# Patient Record
Sex: Female | Born: 1935 | Race: White | Hispanic: No | Marital: Married | Smoking: Former smoker
Health system: Southern US, Community
[De-identification: ages and names within clinical notes are randomized; demographics above are authoritative.]

## PROBLEM LIST (undated history)

## (undated) DIAGNOSIS — R112 Nausea with vomiting, unspecified: Secondary | ICD-10-CM

## (undated) DIAGNOSIS — E785 Hyperlipidemia, unspecified: Secondary | ICD-10-CM

## (undated) DIAGNOSIS — E669 Obesity, unspecified: Secondary | ICD-10-CM

## (undated) DIAGNOSIS — T8859XA Other complications of anesthesia, initial encounter: Secondary | ICD-10-CM

## (undated) DIAGNOSIS — G4730 Sleep apnea, unspecified: Secondary | ICD-10-CM

## (undated) DIAGNOSIS — Z9889 Other specified postprocedural states: Secondary | ICD-10-CM

## (undated) DIAGNOSIS — T4145XA Adverse effect of unspecified anesthetic, initial encounter: Secondary | ICD-10-CM

## (undated) DIAGNOSIS — H409 Unspecified glaucoma: Secondary | ICD-10-CM

## (undated) DIAGNOSIS — F419 Anxiety disorder, unspecified: Secondary | ICD-10-CM

## (undated) DIAGNOSIS — I4821 Permanent atrial fibrillation: Secondary | ICD-10-CM

## (undated) DIAGNOSIS — R911 Solitary pulmonary nodule: Secondary | ICD-10-CM

## (undated) DIAGNOSIS — C50919 Malignant neoplasm of unspecified site of unspecified female breast: Secondary | ICD-10-CM

## (undated) DIAGNOSIS — K219 Gastro-esophageal reflux disease without esophagitis: Secondary | ICD-10-CM

## (undated) DIAGNOSIS — I5042 Chronic combined systolic (congestive) and diastolic (congestive) heart failure: Secondary | ICD-10-CM

## (undated) DIAGNOSIS — Z87442 Personal history of urinary calculi: Secondary | ICD-10-CM

## (undated) DIAGNOSIS — R441 Visual hallucinations: Secondary | ICD-10-CM

## (undated) DIAGNOSIS — H3530 Unspecified macular degeneration: Secondary | ICD-10-CM

## (undated) DIAGNOSIS — H269 Unspecified cataract: Secondary | ICD-10-CM

## (undated) DIAGNOSIS — I10 Essential (primary) hypertension: Secondary | ICD-10-CM

## (undated) HISTORY — DX: Chronic combined systolic (congestive) and diastolic (congestive) heart failure: I50.42

## (undated) HISTORY — PX: CATARACT EXTRACTION, BILATERAL: SHX1313

## (undated) HISTORY — DX: Unspecified macular degeneration: H35.30

## (undated) HISTORY — DX: Malignant neoplasm of unspecified site of unspecified female breast: C50.919

## (undated) HISTORY — DX: Essential (primary) hypertension: I10

## (undated) HISTORY — PX: MASTECTOMY: SHX3

## (undated) HISTORY — DX: Hyperlipidemia, unspecified: E78.5

## (undated) HISTORY — PX: TOTAL KNEE ARTHROPLASTY: SHX125

## (undated) HISTORY — DX: Unspecified glaucoma: H40.9

## (undated) HISTORY — DX: Unspecified cataract: H26.9

## (undated) HISTORY — DX: Sleep apnea, unspecified: G47.30

## (undated) HISTORY — DX: Obesity, unspecified: E66.9

## (undated) HISTORY — DX: Visual hallucinations: R44.1

---

## 1998-08-14 ENCOUNTER — Encounter: Payer: Self-pay | Admitting: *Deleted

## 1998-08-16 ENCOUNTER — Inpatient Hospital Stay (HOSPITAL_COMMUNITY): Admission: RE | Admit: 1998-08-16 | Discharge: 1998-08-21 | Payer: Self-pay | Admitting: *Deleted

## 2000-01-20 ENCOUNTER — Encounter: Payer: Self-pay | Admitting: General Surgery

## 2000-01-20 ENCOUNTER — Encounter: Admission: RE | Admit: 2000-01-20 | Discharge: 2000-01-20 | Payer: Self-pay | Admitting: General Surgery

## 2000-01-20 ENCOUNTER — Other Ambulatory Visit: Admission: RE | Admit: 2000-01-20 | Discharge: 2000-01-20 | Payer: Self-pay | Admitting: General Surgery

## 2000-01-20 ENCOUNTER — Encounter (INDEPENDENT_AMBULATORY_CARE_PROVIDER_SITE_OTHER): Payer: Self-pay | Admitting: Specialist

## 2000-01-26 ENCOUNTER — Encounter: Payer: Self-pay | Admitting: General Surgery

## 2000-01-27 ENCOUNTER — Encounter (INDEPENDENT_AMBULATORY_CARE_PROVIDER_SITE_OTHER): Payer: Self-pay | Admitting: *Deleted

## 2000-01-27 ENCOUNTER — Encounter: Payer: Self-pay | Admitting: General Surgery

## 2000-01-28 ENCOUNTER — Inpatient Hospital Stay (HOSPITAL_COMMUNITY): Admission: AD | Admit: 2000-01-28 | Discharge: 2000-01-29 | Payer: Self-pay | Admitting: General Surgery

## 2000-02-18 ENCOUNTER — Encounter (HOSPITAL_COMMUNITY): Payer: Self-pay | Admitting: Oncology

## 2000-02-18 ENCOUNTER — Ambulatory Visit (HOSPITAL_COMMUNITY): Admission: RE | Admit: 2000-02-18 | Discharge: 2000-02-18 | Payer: Self-pay | Admitting: Oncology

## 2000-02-19 ENCOUNTER — Encounter: Admission: RE | Admit: 2000-02-19 | Discharge: 2000-02-19 | Payer: Self-pay | Admitting: General Surgery

## 2000-02-19 ENCOUNTER — Encounter: Payer: Self-pay | Admitting: General Surgery

## 2000-02-20 ENCOUNTER — Encounter: Payer: Self-pay | Admitting: General Surgery

## 2000-02-20 ENCOUNTER — Ambulatory Visit (HOSPITAL_BASED_OUTPATIENT_CLINIC_OR_DEPARTMENT_OTHER): Admission: RE | Admit: 2000-02-20 | Discharge: 2000-02-20 | Payer: Self-pay | Admitting: General Surgery

## 2000-03-01 ENCOUNTER — Encounter (HOSPITAL_COMMUNITY): Payer: Self-pay | Admitting: Oncology

## 2000-03-01 ENCOUNTER — Ambulatory Visit (HOSPITAL_COMMUNITY): Admission: RE | Admit: 2000-03-01 | Discharge: 2000-03-01 | Payer: Self-pay | Admitting: Oncology

## 2000-08-12 ENCOUNTER — Encounter: Payer: Self-pay | Admitting: Internal Medicine

## 2000-08-12 ENCOUNTER — Ambulatory Visit (HOSPITAL_COMMUNITY): Admission: RE | Admit: 2000-08-12 | Discharge: 2000-08-12 | Payer: Self-pay | Admitting: Internal Medicine

## 2000-08-25 ENCOUNTER — Encounter (HOSPITAL_COMMUNITY): Payer: Self-pay | Admitting: Oncology

## 2000-08-25 ENCOUNTER — Ambulatory Visit (HOSPITAL_COMMUNITY): Admission: RE | Admit: 2000-08-25 | Discharge: 2000-08-25 | Payer: Self-pay | Admitting: Oncology

## 2000-10-26 ENCOUNTER — Encounter: Admission: RE | Admit: 2000-10-26 | Discharge: 2001-01-24 | Payer: Self-pay | Admitting: *Deleted

## 2001-01-28 ENCOUNTER — Encounter: Payer: Self-pay | Admitting: General Surgery

## 2001-01-28 ENCOUNTER — Encounter: Admission: RE | Admit: 2001-01-28 | Discharge: 2001-01-28 | Payer: Self-pay

## 2001-03-10 ENCOUNTER — Encounter (HOSPITAL_COMMUNITY): Payer: Self-pay | Admitting: Oncology

## 2001-03-10 ENCOUNTER — Encounter: Admission: RE | Admit: 2001-03-10 | Discharge: 2001-03-10 | Payer: Self-pay | Admitting: Oncology

## 2001-07-22 ENCOUNTER — Encounter (HOSPITAL_COMMUNITY): Payer: Self-pay | Admitting: Oncology

## 2001-07-22 ENCOUNTER — Ambulatory Visit (HOSPITAL_COMMUNITY): Admission: RE | Admit: 2001-07-22 | Discharge: 2001-07-22 | Payer: Self-pay | Admitting: Oncology

## 2001-08-23 ENCOUNTER — Encounter: Admission: RE | Admit: 2001-08-23 | Discharge: 2001-08-23 | Payer: Self-pay | Admitting: General Surgery

## 2001-08-23 ENCOUNTER — Encounter: Payer: Self-pay | Admitting: General Surgery

## 2002-02-13 ENCOUNTER — Other Ambulatory Visit: Admission: RE | Admit: 2002-02-13 | Discharge: 2002-02-13 | Payer: Self-pay | Admitting: *Deleted

## 2002-06-07 ENCOUNTER — Encounter: Payer: Self-pay | Admitting: General Surgery

## 2002-06-07 ENCOUNTER — Encounter: Admission: RE | Admit: 2002-06-07 | Discharge: 2002-06-07 | Payer: Self-pay | Admitting: General Surgery

## 2002-06-15 ENCOUNTER — Encounter (INDEPENDENT_AMBULATORY_CARE_PROVIDER_SITE_OTHER): Payer: Self-pay

## 2002-06-15 ENCOUNTER — Ambulatory Visit (HOSPITAL_COMMUNITY): Admission: RE | Admit: 2002-06-15 | Discharge: 2002-06-15 | Payer: Self-pay | Admitting: Gastroenterology

## 2002-06-20 ENCOUNTER — Encounter: Payer: Self-pay | Admitting: General Surgery

## 2002-06-20 ENCOUNTER — Encounter (INDEPENDENT_AMBULATORY_CARE_PROVIDER_SITE_OTHER): Payer: Self-pay

## 2002-06-20 ENCOUNTER — Observation Stay (HOSPITAL_COMMUNITY): Admission: RE | Admit: 2002-06-20 | Discharge: 2002-06-21 | Payer: Self-pay | Admitting: General Surgery

## 2002-07-19 ENCOUNTER — Encounter: Payer: Self-pay | Admitting: *Deleted

## 2002-07-20 ENCOUNTER — Ambulatory Visit (HOSPITAL_COMMUNITY): Admission: RE | Admit: 2002-07-20 | Discharge: 2002-07-20 | Payer: Self-pay | Admitting: *Deleted

## 2002-07-25 ENCOUNTER — Encounter: Payer: Self-pay | Admitting: *Deleted

## 2002-07-25 ENCOUNTER — Inpatient Hospital Stay (HOSPITAL_COMMUNITY): Admission: RE | Admit: 2002-07-25 | Discharge: 2002-07-31 | Payer: Self-pay | Admitting: *Deleted

## 2002-07-27 ENCOUNTER — Encounter: Payer: Self-pay | Admitting: *Deleted

## 2003-01-11 ENCOUNTER — Encounter: Admission: RE | Admit: 2003-01-11 | Discharge: 2003-01-11 | Payer: Self-pay | Admitting: General Surgery

## 2003-01-11 ENCOUNTER — Encounter: Payer: Self-pay | Admitting: General Surgery

## 2003-01-30 ENCOUNTER — Encounter (HOSPITAL_COMMUNITY): Payer: Self-pay | Admitting: Oncology

## 2003-01-30 ENCOUNTER — Ambulatory Visit (HOSPITAL_COMMUNITY): Admission: RE | Admit: 2003-01-30 | Discharge: 2003-01-30 | Payer: Self-pay | Admitting: Oncology

## 2003-02-19 ENCOUNTER — Ambulatory Visit (HOSPITAL_COMMUNITY): Admission: AD | Admit: 2003-02-19 | Discharge: 2003-02-20 | Payer: Self-pay | Admitting: Ophthalmology

## 2003-02-19 ENCOUNTER — Encounter: Payer: Self-pay | Admitting: Ophthalmology

## 2003-04-16 ENCOUNTER — Ambulatory Visit (HOSPITAL_BASED_OUTPATIENT_CLINIC_OR_DEPARTMENT_OTHER): Admission: RE | Admit: 2003-04-16 | Discharge: 2003-04-16 | Payer: Self-pay | Admitting: General Surgery

## 2003-09-05 ENCOUNTER — Encounter: Admission: RE | Admit: 2003-09-05 | Discharge: 2003-09-05 | Payer: Self-pay | Admitting: Oncology

## 2004-02-28 ENCOUNTER — Encounter: Admission: RE | Admit: 2004-02-28 | Discharge: 2004-02-28 | Payer: Self-pay | Admitting: General Surgery

## 2004-08-16 ENCOUNTER — Ambulatory Visit: Payer: Self-pay | Admitting: Oncology

## 2004-08-18 ENCOUNTER — Ambulatory Visit (HOSPITAL_COMMUNITY): Admission: RE | Admit: 2004-08-18 | Discharge: 2004-08-18 | Payer: Self-pay | Admitting: Oncology

## 2004-12-12 ENCOUNTER — Ambulatory Visit: Payer: Self-pay | Admitting: Oncology

## 2004-12-20 ENCOUNTER — Emergency Department (HOSPITAL_COMMUNITY): Admission: EM | Admit: 2004-12-20 | Discharge: 2004-12-20 | Payer: Self-pay | Admitting: Emergency Medicine

## 2005-03-12 ENCOUNTER — Encounter: Admission: RE | Admit: 2005-03-12 | Discharge: 2005-03-12 | Payer: Self-pay | Admitting: General Surgery

## 2005-04-13 ENCOUNTER — Ambulatory Visit: Payer: Self-pay | Admitting: Oncology

## 2005-08-31 ENCOUNTER — Ambulatory Visit: Payer: Self-pay | Admitting: Oncology

## 2005-09-01 ENCOUNTER — Ambulatory Visit (HOSPITAL_COMMUNITY): Admission: RE | Admit: 2005-09-01 | Discharge: 2005-09-01 | Payer: Self-pay | Admitting: Oncology

## 2005-11-26 ENCOUNTER — Ambulatory Visit: Payer: Self-pay | Admitting: Cardiology

## 2005-11-26 ENCOUNTER — Inpatient Hospital Stay (HOSPITAL_COMMUNITY): Admission: EM | Admit: 2005-11-26 | Discharge: 2005-12-01 | Payer: Self-pay | Admitting: Emergency Medicine

## 2005-11-27 ENCOUNTER — Encounter: Payer: Self-pay | Admitting: Cardiology

## 2005-12-22 ENCOUNTER — Encounter: Admission: RE | Admit: 2005-12-22 | Discharge: 2005-12-22 | Payer: Self-pay | Admitting: Internal Medicine

## 2005-12-28 ENCOUNTER — Ambulatory Visit: Payer: Self-pay | Admitting: Oncology

## 2006-01-06 ENCOUNTER — Ambulatory Visit: Payer: Self-pay | Admitting: Cardiology

## 2006-03-24 ENCOUNTER — Encounter: Admission: RE | Admit: 2006-03-24 | Discharge: 2006-03-24 | Payer: Self-pay | Admitting: Internal Medicine

## 2006-04-02 ENCOUNTER — Ambulatory Visit: Payer: Self-pay | Admitting: Oncology

## 2006-04-12 LAB — COMPREHENSIVE METABOLIC PANEL
ALT: 17 U/L (ref 0–40)
CO2: 23 mEq/L (ref 19–32)
Calcium: 9.9 mg/dL (ref 8.4–10.5)
Chloride: 107 mEq/L (ref 96–112)
Creatinine, Ser: 0.99 mg/dL (ref 0.40–1.20)
Glucose, Bld: 107 mg/dL — ABNORMAL HIGH (ref 70–99)
Sodium: 138 mEq/L (ref 135–145)
Total Bilirubin: 0.5 mg/dL (ref 0.3–1.2)
Total Protein: 6.6 g/dL (ref 6.0–8.3)

## 2006-04-12 LAB — LACTATE DEHYDROGENASE: LDH: 158 U/L (ref 94–250)

## 2006-04-12 LAB — CBC WITH DIFFERENTIAL/PLATELET
BASO%: 0.4 % (ref 0.0–2.0)
HCT: 36.5 % (ref 34.8–46.6)
LYMPH%: 29.6 % (ref 14.0–48.0)
MCHC: 35 g/dL (ref 32.0–36.0)
MCV: 95.2 fL (ref 81.0–101.0)
MONO#: 0.5 10*3/uL (ref 0.1–0.9)
MONO%: 9.7 % (ref 0.0–13.0)
NEUT%: 56.6 % (ref 39.6–76.8)
Platelets: 268 10*3/uL (ref 145–400)
WBC: 4.7 10*3/uL (ref 3.9–10.0)

## 2006-04-14 ENCOUNTER — Ambulatory Visit: Payer: Self-pay | Admitting: Cardiology

## 2006-07-06 ENCOUNTER — Ambulatory Visit (HOSPITAL_COMMUNITY): Admission: RE | Admit: 2006-07-06 | Discharge: 2006-07-06 | Payer: Self-pay | Admitting: Oncology

## 2006-07-09 ENCOUNTER — Ambulatory Visit: Payer: Self-pay | Admitting: Oncology

## 2006-10-11 ENCOUNTER — Ambulatory Visit: Payer: Self-pay | Admitting: Cardiology

## 2006-11-04 ENCOUNTER — Ambulatory Visit: Payer: Self-pay | Admitting: Oncology

## 2006-11-09 LAB — COMPREHENSIVE METABOLIC PANEL
ALT: 26 U/L (ref 0–35)
AST: 28 U/L (ref 0–37)
Albumin: 3.8 g/dL (ref 3.5–5.2)
CO2: 25 mEq/L (ref 19–32)
Calcium: 9.7 mg/dL (ref 8.4–10.5)
Chloride: 108 mEq/L (ref 96–112)
Potassium: 4 mEq/L (ref 3.5–5.3)
Sodium: 144 mEq/L (ref 135–145)
Total Protein: 6.6 g/dL (ref 6.0–8.3)

## 2006-11-09 LAB — CBC WITH DIFFERENTIAL/PLATELET
BASO%: 0.6 % (ref 0.0–2.0)
EOS%: 4.7 % (ref 0.0–7.0)
MCH: 33.4 pg (ref 26.0–34.0)
MCHC: 35.3 g/dL (ref 32.0–36.0)
MONO#: 0.5 10*3/uL (ref 0.1–0.9)
RDW: 13.2 % (ref 11.3–14.5)
WBC: 4.8 10*3/uL (ref 3.9–10.0)
lymph#: 1.7 10*3/uL (ref 0.9–3.3)

## 2007-03-29 ENCOUNTER — Encounter: Admission: RE | Admit: 2007-03-29 | Discharge: 2007-03-29 | Payer: Self-pay | Admitting: General Surgery

## 2007-05-09 ENCOUNTER — Ambulatory Visit: Payer: Self-pay | Admitting: Oncology

## 2007-05-12 LAB — CBC WITH DIFFERENTIAL/PLATELET
BASO%: 0.4 % (ref 0.0–2.0)
Basophils Absolute: 0 10*3/uL (ref 0.0–0.1)
EOS%: 3.9 % (ref 0.0–7.0)
HGB: 13 g/dL (ref 11.6–15.9)
MCH: 34.2 pg — ABNORMAL HIGH (ref 26.0–34.0)
MCHC: 35.9 g/dL (ref 32.0–36.0)
MCV: 95.3 fL (ref 81.0–101.0)
MONO%: 9.5 % (ref 0.0–13.0)
RDW: 12.9 % (ref 11.3–14.5)

## 2007-05-12 LAB — COMPREHENSIVE METABOLIC PANEL
Albumin: 3.7 g/dL (ref 3.5–5.2)
BUN: 18 mg/dL (ref 6–23)
CO2: 24 mEq/L (ref 19–32)
Glucose, Bld: 134 mg/dL — ABNORMAL HIGH (ref 70–99)
Potassium: 4.1 mEq/L (ref 3.5–5.3)
Sodium: 141 mEq/L (ref 135–145)
Total Bilirubin: 0.6 mg/dL (ref 0.3–1.2)
Total Protein: 6.5 g/dL (ref 6.0–8.3)

## 2007-05-12 LAB — LACTATE DEHYDROGENASE: LDH: 166 U/L (ref 94–250)

## 2007-05-30 ENCOUNTER — Ambulatory Visit: Payer: Self-pay | Admitting: Cardiology

## 2007-10-10 ENCOUNTER — Ambulatory Visit: Payer: Self-pay | Admitting: Oncology

## 2007-10-10 LAB — COMPREHENSIVE METABOLIC PANEL
ALT: 40 U/L — ABNORMAL HIGH (ref 0–35)
Albumin: 3.8 g/dL (ref 3.5–5.2)
CO2: 25 mEq/L (ref 19–32)
Calcium: 9.7 mg/dL (ref 8.4–10.5)
Chloride: 108 mEq/L (ref 96–112)
Potassium: 4.8 mEq/L (ref 3.5–5.3)
Sodium: 142 mEq/L (ref 135–145)
Total Protein: 6.8 g/dL (ref 6.0–8.3)

## 2007-10-10 LAB — CBC WITH DIFFERENTIAL/PLATELET
BASO%: 2.2 % — ABNORMAL HIGH (ref 0.0–2.0)
MCHC: 34.2 g/dL (ref 32.0–36.0)
MONO#: 0.6 10*3/uL (ref 0.1–0.9)
NEUT#: 3.6 10*3/uL (ref 1.5–6.5)
RBC: 3.82 10*6/uL (ref 3.70–5.32)
WBC: 5.9 10*3/uL (ref 3.9–10.0)
lymph#: 1.5 10*3/uL (ref 0.9–3.3)

## 2007-10-10 LAB — LACTATE DEHYDROGENASE: LDH: 183 U/L (ref 94–250)

## 2007-10-13 ENCOUNTER — Ambulatory Visit (HOSPITAL_COMMUNITY): Admission: RE | Admit: 2007-10-13 | Discharge: 2007-10-13 | Payer: Self-pay | Admitting: Oncology

## 2007-10-20 ENCOUNTER — Ambulatory Visit (HOSPITAL_COMMUNITY): Admission: RE | Admit: 2007-10-20 | Discharge: 2007-10-20 | Payer: Self-pay | Admitting: Oncology

## 2007-11-03 ENCOUNTER — Ambulatory Visit (HOSPITAL_COMMUNITY): Admission: RE | Admit: 2007-11-03 | Discharge: 2007-11-03 | Payer: Self-pay | Admitting: Oncology

## 2007-12-20 ENCOUNTER — Encounter: Payer: Self-pay | Admitting: Cardiology

## 2007-12-21 ENCOUNTER — Ambulatory Visit: Payer: Self-pay | Admitting: Cardiology

## 2008-04-04 ENCOUNTER — Ambulatory Visit: Payer: Self-pay | Admitting: Oncology

## 2008-04-09 LAB — COMPREHENSIVE METABOLIC PANEL
ALT: 49 U/L — ABNORMAL HIGH (ref 0–35)
AST: 51 U/L — ABNORMAL HIGH (ref 0–37)
Albumin: 4 g/dL (ref 3.5–5.2)
Alkaline Phosphatase: 104 U/L (ref 39–117)
Glucose, Bld: 131 mg/dL — ABNORMAL HIGH (ref 70–99)
Potassium: 4.1 mEq/L (ref 3.5–5.3)
Sodium: 140 mEq/L (ref 135–145)
Total Bilirubin: 0.6 mg/dL (ref 0.3–1.2)
Total Protein: 7 g/dL (ref 6.0–8.3)

## 2008-04-09 LAB — CBC WITH DIFFERENTIAL/PLATELET
BASO%: 0.8 % (ref 0.0–2.0)
EOS%: 2.4 % (ref 0.0–7.0)
LYMPH%: 33.7 % (ref 14.0–48.0)
MCH: 33.6 pg (ref 26.0–34.0)
MCHC: 35.2 g/dL (ref 32.0–36.0)
MCV: 95.5 fL (ref 81.0–101.0)
MONO%: 9.3 % (ref 0.0–13.0)
NEUT#: 3 10*3/uL (ref 1.5–6.5)
Platelets: 215 10*3/uL (ref 145–400)
RBC: 4.08 10*6/uL (ref 3.70–5.32)
RDW: 11.9 % (ref 11.3–14.5)

## 2008-04-26 ENCOUNTER — Encounter: Admission: RE | Admit: 2008-04-26 | Discharge: 2008-04-26 | Payer: Self-pay | Admitting: Internal Medicine

## 2008-05-08 ENCOUNTER — Ambulatory Visit (HOSPITAL_COMMUNITY): Admission: RE | Admit: 2008-05-08 | Discharge: 2008-05-08 | Payer: Self-pay | Admitting: Oncology

## 2008-10-17 DIAGNOSIS — E039 Hypothyroidism, unspecified: Secondary | ICD-10-CM | POA: Insufficient documentation

## 2008-11-15 ENCOUNTER — Ambulatory Visit: Payer: Self-pay | Admitting: Oncology

## 2008-11-19 LAB — CBC WITH DIFFERENTIAL/PLATELET
BASO%: 0.4 % (ref 0.0–2.0)
EOS%: 2.2 % (ref 0.0–7.0)
MCH: 34.2 pg — ABNORMAL HIGH (ref 26.0–34.0)
MCHC: 34.3 g/dL (ref 32.0–36.0)
RDW: 12.6 % (ref 11.3–14.5)
lymph#: 1.9 10*3/uL (ref 0.9–3.3)

## 2008-11-19 LAB — COMPREHENSIVE METABOLIC PANEL
ALT: 27 U/L (ref 0–35)
Albumin: 3.8 g/dL (ref 3.5–5.2)
Alkaline Phosphatase: 108 U/L (ref 39–117)
Glucose, Bld: 81 mg/dL (ref 70–99)
Potassium: 4.1 mEq/L (ref 3.5–5.3)
Sodium: 140 mEq/L (ref 135–145)
Total Bilirubin: 0.6 mg/dL (ref 0.3–1.2)
Total Protein: 6.9 g/dL (ref 6.0–8.3)

## 2008-11-19 LAB — LACTATE DEHYDROGENASE: LDH: 167 U/L (ref 94–250)

## 2008-12-24 ENCOUNTER — Ambulatory Visit: Payer: Self-pay | Admitting: Cardiology

## 2009-04-26 ENCOUNTER — Encounter: Admission: RE | Admit: 2009-04-26 | Discharge: 2009-04-26 | Payer: Self-pay | Admitting: Internal Medicine

## 2009-04-29 ENCOUNTER — Encounter: Admission: RE | Admit: 2009-04-29 | Discharge: 2009-04-29 | Payer: Self-pay | Admitting: Internal Medicine

## 2009-05-01 ENCOUNTER — Encounter (INDEPENDENT_AMBULATORY_CARE_PROVIDER_SITE_OTHER): Payer: Self-pay | Admitting: *Deleted

## 2009-05-13 ENCOUNTER — Ambulatory Visit (HOSPITAL_COMMUNITY): Admission: RE | Admit: 2009-05-13 | Discharge: 2009-05-13 | Payer: Self-pay | Admitting: Oncology

## 2009-05-16 ENCOUNTER — Ambulatory Visit: Payer: Self-pay | Admitting: Oncology

## 2009-05-20 LAB — COMPREHENSIVE METABOLIC PANEL
ALT: 13 U/L (ref 0–35)
CO2: 25 mEq/L (ref 19–32)
Calcium: 10 mg/dL (ref 8.4–10.5)
Chloride: 104 mEq/L (ref 96–112)
Creatinine, Ser: 0.98 mg/dL (ref 0.40–1.20)
Glucose, Bld: 138 mg/dL — ABNORMAL HIGH (ref 70–99)
Sodium: 140 mEq/L (ref 135–145)
Total Protein: 6.9 g/dL (ref 6.0–8.3)

## 2009-05-20 LAB — CBC WITH DIFFERENTIAL/PLATELET
BASO%: 0.4 % (ref 0.0–2.0)
Eosinophils Absolute: 0.1 10*3/uL (ref 0.0–0.5)
HCT: 39.3 % (ref 34.8–46.6)
MCHC: 34.7 g/dL (ref 31.5–36.0)
MONO#: 0.4 10*3/uL (ref 0.1–0.9)
NEUT#: 3.1 10*3/uL (ref 1.5–6.5)
NEUT%: 60.5 % (ref 38.4–76.8)
WBC: 5.2 10*3/uL (ref 3.9–10.3)
lymph#: 1.5 10*3/uL (ref 0.9–3.3)

## 2009-05-20 LAB — LACTATE DEHYDROGENASE: LDH: 162 U/L (ref 94–250)

## 2009-11-18 ENCOUNTER — Ambulatory Visit: Payer: Self-pay | Admitting: Oncology

## 2009-11-21 LAB — COMPREHENSIVE METABOLIC PANEL
AST: 20 U/L (ref 0–37)
Albumin: 4 g/dL (ref 3.5–5.2)
Alkaline Phosphatase: 80 U/L (ref 39–117)
BUN: 18 mg/dL (ref 6–23)
Calcium: 8.9 mg/dL (ref 8.4–10.5)
Chloride: 103 mEq/L (ref 96–112)
Potassium: 4.1 mEq/L (ref 3.5–5.3)
Sodium: 142 mEq/L (ref 135–145)
Total Protein: 7.1 g/dL (ref 6.0–8.3)

## 2009-11-21 LAB — CBC WITH DIFFERENTIAL/PLATELET
BASO%: 0.3 % (ref 0.0–2.0)
EOS%: 2.8 % (ref 0.0–7.0)
Eosinophils Absolute: 0.2 10*3/uL (ref 0.0–0.5)
HCT: 40.3 % (ref 34.8–46.6)
LYMPH%: 29 % (ref 14.0–49.7)
MCHC: 35.1 g/dL (ref 31.5–36.0)
MCV: 99.6 fL (ref 79.5–101.0)
NEUT%: 59.4 % (ref 38.4–76.8)
Platelets: 259 10*3/uL (ref 145–400)
RBC: 4.05 10*6/uL (ref 3.70–5.45)
WBC: 6.3 10*3/uL (ref 3.9–10.3)

## 2010-05-30 ENCOUNTER — Ambulatory Visit: Payer: Self-pay | Admitting: Oncology

## 2010-06-03 ENCOUNTER — Ambulatory Visit (HOSPITAL_COMMUNITY): Admission: RE | Admit: 2010-06-03 | Discharge: 2010-06-03 | Payer: Self-pay | Admitting: Oncology

## 2010-06-03 LAB — CBC WITH DIFFERENTIAL/PLATELET
Eosinophils Absolute: 0.1 10*3/uL (ref 0.0–0.5)
LYMPH%: 23.3 % (ref 14.0–49.7)
MCHC: 34.5 g/dL (ref 31.5–36.0)
MONO#: 0.8 10*3/uL (ref 0.1–0.9)
NEUT#: 5.1 10*3/uL (ref 1.5–6.5)
NEUT%: 65.1 % (ref 38.4–76.8)
RBC: 4.09 10*6/uL (ref 3.70–5.45)
WBC: 7.8 10*3/uL (ref 3.9–10.3)
lymph#: 1.8 10*3/uL (ref 0.9–3.3)

## 2010-06-03 LAB — COMPREHENSIVE METABOLIC PANEL
Alkaline Phosphatase: 79 U/L (ref 39–117)
CO2: 27 mEq/L (ref 19–32)
Chloride: 105 mEq/L (ref 96–112)
Creatinine, Ser: 1.24 mg/dL — ABNORMAL HIGH (ref 0.40–1.20)
Glucose, Bld: 82 mg/dL (ref 70–99)
Total Bilirubin: 0.8 mg/dL (ref 0.3–1.2)

## 2010-06-03 LAB — LACTATE DEHYDROGENASE: LDH: 148 U/L (ref 94–250)

## 2010-06-30 ENCOUNTER — Encounter: Admission: RE | Admit: 2010-06-30 | Discharge: 2010-06-30 | Payer: Self-pay | Admitting: Oncology

## 2010-10-26 ENCOUNTER — Encounter (HOSPITAL_COMMUNITY): Payer: Self-pay | Admitting: Oncology

## 2011-02-17 NOTE — Assessment & Plan Note (Signed)
[facility]                            CARDIOLOGY OFFICE NOTE   NAME:Nick, Shantoya                           MRN:          [id number]  DATE:05/30/2007                            DOB:          03/15/1936    Ms. Lusty is in for A followup visit.  In general,  she is stable. She  has some mild shortness of breath with exertion. She has quit [employer] and unfortunately has continued to gain weight.   PHYSICAL EXAMINATION:  VITAL SIGNS:  The weight is 226 pounds.  Blood  pressure 139/63 and pulse of 69.  LUNGS:  The lung fields are clear.  CARDIAC:  Rhythm is regular.   EKG reveals a normal sinus rhythm with a leftward oriented axis.   The patient remains stable on her current medical regimen.  She has  medically treated disease.  She needs to try to get her weight under  control. She needs general followup.  We will see her back in followup  in 6 months.     Arturo Morton. Riley Kill, MD, [facility]  Electronically Signed    TDS/MedQ  DD: 10/16/2007  DT: 10/16/2007  Job #: [id number]

## 2011-02-17 NOTE — Assessment & Plan Note (Signed)
[facility]                            CARDIOLOGY OFFICE NOTE   NAME:Amy Hahn, Amy Hahn                         MRN:          [id number]  DATE:12/20/2007                            DOB:          [birth year]    PRIMARY:  Dr. Dimas Alexandria.   REASON FOR PRESENTATION:  The patient was a walk-in today for evaluation  of chest discomfort.   HISTORY OF PRESENT ILLNESS:  The patient is a 75 year old white female  who had an episode of right arm discomfort and shoulder discomfort with  some subscapular pain about 6 days ago; this lasted for about 20-25  minutes.  It went away spontaneously, though she did take half a  Hydrocodone and Rolaids.  She had the same discomfort on Sunday; this  time, she had some substernal discomfort.  She felt little diaphoretic  with this.  It was moderate in intensity.  It was a constant and she  described it as hard.  She could not quantify it.  There was no nausea  or vomiting.  It was not like previous discomfort that she has had.  She  did not have any neck discomfort.  She did not have any shortness of  breath.  The patient did not have any PND or orthopnea.  Again, it went  away on its own and she has had no recurrence of this in the last 2  days.  She has had chest discomfort not like this with some GI problems  in the past.  She has also had a workup that has included  catheterization demonstrating some mild LAD plaquing.  There was a  second diagonal with 30% to 40% ostial stenosis, circumflex had 20%  stenosis, the right coronary artery has some mild plaquing.  She had  normal coronaries.  The patient has had lots of GI problems in the past  and is seen by Dr. Ewing Schlein.   PAST MEDICAL HISTORY:  1. Nonobstructive coronary disease.  2. Hypertension.  3. Breast cancer, status post right mastectomy and chemotherapy.  4. Hypothyroidism.  5. Reflux.  6. Esophageal spasm.  7. Hiatal hernia.  8. Peptic stricture.  9. Knee  replacement in 2000.  10.Cesarean section.  11.Cholecystectomy.   ALLERGIES/INTOLERANCES:  MORPHINE, LATEX, SOME BLOOD PRESSURE MEDS.   MEDICATIONS:  1. Nexium 40 mg p.o. nightly.  2. Labetalol 150 mg b.i.d.  3. Furosemide 20 mg weekly.  4. Lipitor 40 mg daily.  5. Multivitamin.  6. Calcium.  7. Aspirin.  8. Norvasc 5 mg daily.  9. Xalatan.  10.Levoxyl.  11.Avapro 150 mg daily.   REVIEW OF SYSTEMS:  As stated in the HPI and otherwise negative for  other systems.   PHYSICAL EXAMINATION:  The patient is in no distress.  Blood pressure 130/90, heart rate 66 and regular, weight 224 pounds.  HEENT:  Eyelids unremarkable; pupils equal, round and reactive to light;  fundi not visualized.  NECK:  No jugular distention at 45 degrees, carotid upstroke brisk and  symmetrical, no bruits.  No thyromegaly.  LUNGS:  Clear to auscultation bilaterally.  HEART:  PMI not displaced or sustained, S1-S2 within normal limits, no  S3-S4, no clicks, no rubs, no murmurs.  ABDOMEN:  Obese; positive bowel sounds, normal in frequency and pitch;  no bruits, no rebound, no guarding, no midline pulsatile mass, no  organomegaly.  SKIN:  No rashes, no nodules.  EXTREMITIES:  Pulses 2+, no edema.   ASSESSMENT:  1. Chest discomfort.  The patient has had some atypical chest and      right arm discomfort.  She has had a negative cardiac workup in the      past.  Her EKG is unremarkable.  There is no objective evidence of      ischemia.  At this point, I think the pretest probability of      obstructive coronary disease is quite low.  I do not think further      cardiovascular testing would be warranted.  The patient felt      reassured by this.  She is due to follow up with Dr. Riley Kill.  She      should certainly come back here should she have any other      discomfort that bothers her.  2. Hypertension.  Her blood pressure is well controlled.  She is going      to continue medications as listed.  3.  Obesity.  She understands the need to lose weight with diet and      exercise.Rollene Rotunda, MD, [facility]  Electronically Signed    JH/MedQ  DD: 12/20/2007  DT: 12/21/2007  Job #: [id number]

## 2011-02-17 NOTE — Letter (Signed)
December 24, 2008    [name], MD  [address]  [place], [place] [address]   RE:  ZYKIRA, MATLACK  MRN:  [id number]  /  DOB:  [birth year]   Dear Dr. Selena Batten,   I had the pleasure of seeing your nice patient, Amy Hahn, in the  office today in followup.  In general, she has been doing pretty well.  She saw Dr. Antoine Poche about a year ago.  At that time, she had had some  chest discomfort that was thought that it was atypical.  She has not had  any problems in the interim and that has been over a year.  She sees Dr.  Arline Asp, as you know, she has had a skin cancer removed from her back.   MEDICATIONS:  1. Nexium 40 mg daily.  2. Labetalol 150 mg b.i.d.  3. Avapro 150 daily.  4. Levoxyl 50 mcg daily.  5. Xalatan eye drops daily.  6. Diazepam 5 mg 3 tablets daily.  7. Hydrocodone daily.  8. Norvasc 5 mg daily.  9. Aspirin 81 mg daily.  10.Calcium and vitamin D, multivitamin daily.  11.Lipitor 40 mg daily.  12.Furosemide 20 mg a week.   On physical, the blood pressure is 110/90 and pulse 69.  The lung fields  are clear.  The cardiac rhythm is regular.  I did not appreciate a  significant murmur.   EKG reveals sinus rhythm.  There is a leftward oriented axis and delayed  R-wave progression mediated by the leftward axis.  She has moderate  voltage criteria for left ventricular hypertrophy.   Overall, the patient is stable.  You have been doing an excellent job of  following her, and at the present time, she is not having any specific  cardiac complaints.  Because you are following her carefully, we will  see her again as you feel needed.  I would be more than happy to see her  at any time.  Thanks for referring her.    Sincerely,      Arturo Morton. Riley Kill, MD, [facility]  Electronically Signed    TDS/MedQ  DD: 12/24/2008  DT: 12/25/2008  Job #: [id number]

## 2011-02-20 NOTE — Op Note (Signed)
[facility]  Patient:    Amy Hahn, Amy Hahn                         MRN: [id number] Proc. Date: 02/20/00 Adm. Date:  [id number] Disc. Date: [id number] Attending:  Henrene Dodge Dictator:   Anselm Pancoast. Zachery Dakins, M.D. CC:         Anselm Pancoast. Zachery Dakins, M.D.                           Operative Report  PREOPERATIVE DIAGNOSIS:  Left carcinoma of the right breast, needs IV access.  POSTOPERATIVE DIAGNOSIS:  Left carcinoma of the right breast, needs IV access.  OPERATION:  Placement of Port-A-Cath, left subclavian.  ANESTHESIA:  Local with sedation.  SURGEON:  Dr. Consuello Bossier.  HISTORY OF PRESENT ILLNESS:  [name] is a 75 year old Caucasian female who was referred to me for a right breast mass.  This has been biopsied by Dr. ______, and was found to be an invasive carcinoma.  I scheduled her for a sentinel node and excisional biopsy of the sentinel nodes which were positive, and she had requested to proceed with a modified radical mastectomy as it was not minimal disease, and I did a right modified radical mastectomy.  She has now had a metastatic workup with none found.  She is seeing Dr. Elijah Birk Murrinson for adjuvant chemotherapy, had 4/22 nodes positive.  I have been asked to place a Port-A-Cath for IV access.  DESCRIPTION OF PROCEDURE:  The patient was taken to the operative suite.  An IV had been started on the left, given 1 g of Kefzol.  She was positioned on the OR table, and the left chest and breast subclavian area was prepped with Betadine and surgery scrubbing solution and draped in a sterile manner.  I then anesthetized the subclavian area with plain Xylocaine, and then placed her in Trendelenburg position with C-arm guidance.  On the first stick I was able to enter the subclavian vein, and then the guide wire introduced nicely. I had placed a towel on the skin clip where I think the atrium and superior vena cava junction is.  We  then placed her in a little less Trendelenburg, went ahead and created a pocket with subcutaneous infiltration of the Xylocaine, created a little pocket, and then we headed back down, introduced the dilator over the guide wire and threaded in the 10 Cook dilator.  I then tunneled the subcutaneous into the pocket.  The Port-A-Cath had been previously filled with heparinized saline.  I positioned it such that I think is right at the atrium superior vena cava junction, and then slipped it a little over, and then I slipped the locking device into place.  This was the detachable Duvall Port-A-Cath.  I then completed with three sutures of 2-0 Prolene to the fascia, closed the subcutaneous wounds around the Port-A-Cath with 3-0 chromic, 4-0 Dexon subcuticular, and then benzoin and Steri-Strips on the skin.  The patient tolerated the procedure nicely.  C-arm shows it in good position.  I filled it with the heparinized saline 100 units per cc 5 cc, and had a good easy flow of blood return.  The patient will be released after a short stay in the recovery room.  We will get a chest x-ray in the recovery room. DD:  02/20/00 TD:  02/25/00 Job: [id number]

## 2011-02-20 NOTE — H&P (Signed)
NAME:  Amy Hahn, Amy Hahn NO.:  [id number]   MEDICAL RECORD NO.:  [id number]          PATIENT TYPE:  EMS   LOCATION:  MAJO                         FACILITY:  [facility]   PHYSICIAN:  Michaelyn Barter, M.D. DATE OF BIRTH:  18-Jan-1936   DATE OF ADMISSION:  11/26/2005  DATE OF DISCHARGE:                                HISTORY & PHYSICAL   CHIEF COMPLAINT:  Chest pain.   HISTORY OF PRESENT ILLNESS:  Amy Hahn is a 75 year old female with a past  medical history of hypertension, hyperlipidemia, hypothyroidism, and breast  CA, who states that she had centrally located chest pain at approximately  11:15 a.m.  She was at the drug store when this started.  She describes it  as being centrally located chest pain that does not travel to the neck, nor  does it travel to the arm or to the back.  It is approximately 9 out of 10  in intensity.  It was accompanied by nausea, diaphoresis, and, described as  a squeezing sensation.  She became short of breath during the episode which  lasted approximately 8-10 minutes.  She said that she had experienced chest  pain approximately one year ago, however, the pain today is very different  from that episode.  There were no aggravating factors or alleviating  factors.  The pain remitted on its own.  Currently she denies having chest  pain or shortness of breath.   PAST MEDICAL HISTORY:  1.  Hiatal hernia.  2.  GERD.  3.  Hypertension.  4.  Hypothyroidism.  5.  Glaucoma.  6.  Hyperlipidemia.  7.  Questionable history of lupus.  8.  The patient states she underwent a cardiac catheterization approximately      13-15 years ago, she cannot remember who did it, however, she states the      results were unremarkable.   PAST SURGICAL HISTORY:  1.  Right breast cancer removed via mastectomy in April 2001.  The patient      underwent chemotherapy for one year followed by seven months of      radiation.  2.  Cholecystectomy.  3.  Bilateral knee  replacements.  4.  C-section.  5.  Toe repair following trauma.  6.  Multiple skin cancers removed from the patient's face and back.  7.  Cataract surgery in the left eye.  8.  Rectal fistula repair.  9.  Retinal detached left eye repair x 2.   SOCIAL HISTORY:  Cigarettes, the patient stopped smoking approximately 4-5  years ago, she started smoking at age 89, she smoked approximately 3/4 pack  cigarettes per day.  Alcohol, the patient drinks red wine occasionally.   FAMILY HISTORY:  Mother has a history of eye cancer, cerebral hemorrhage,  diabetes mellitus, CVA x 3.  Father had two MIs at age 67, history of  prostate cancer, colon cancer, he died from an MI at age 67.  Brother had  three MIs, the first being at age 78.  Sister has a history of cancer.   REVIEW OF SYMPTOMS:  As per  HPI, otherwise, all other systems are negative.   PHYSICAL EXAMINATION:  GENERAL:  The patient is awake, she is cooperative, she shows no obvious  signs of distress, no respiratory compromise is apparent.  VITAL SIGNS:  Temperature 97, blood pressure 160/55 initially, declined to  128/48, heart rate 62, respirations 16, O2 saturation 97% on room air.  HEENT:  Normocephalic, atraumatic, the patient has dentures present, both  upper and lower, no visible thrush.  NECK:  Supple, no lymphadenopathy, no JVD, thyroid not palpable.  CARDIAC:  S1 and S2 present, regular rate and rhythm, no S3 or S4.  RESPIRATORY:  Clear, no crackles, no wheezes.  ABDOMEN:  Soft, nontender, nondistended, positive bowel sounds x 4  quadrants.  EXTREMITIES:  No edema.  NEUROLOGICAL:  The patient is alert and oriented x 3.  Cranial nerves 2-12  are intact.  MUSCULOSKELETAL:  5/5 upper and lower extremity strength.   LABORATORY DATA:  Arterial blood gas with pH 7.388, pCO2 44.5, bicarb 26.8.  Hemoglobin 13.6, hematocrit 40.  Sodium 137, potassium 4.2, chloride 106,  BUN 23, creatinine 1, glucose 90.  CK MB, PLC 2, troponin I PLC  less than  0.05, myoglobin PLC 172.  Chest x-ray reveals no acute disease.  EKG is not  immediately available.   ASSESSMENT/PLAN:  Amy Hahn is a 75 year old female with multiple cardiac  risk factors here for evaluation of chest pain.   1.  Chest pain.  Will admit the patient to telemetry floor and rule her out      for MI via checking cardiac enzymes x 3 eight hours apart.  We will      provide p.r.n. morphine, oxygen, sublingual nitroglycerin, and aspirin.      In addition, will check a fasting lipid profile.  The patient has      multiple cardiovascular risk factors including a history of cigarette      use, hypertension, hyperlipidemia, father ands brother who both      experienced MIs, obesity, post menopausal.  Therefore, will consult      cardiology in the morning.  The patient may benefit from a stress  test      and/or cardiac catheterization.  Likewise, will also order a 2D echo to      evaluate the patient's overall EF.  2.  History of hypertension.  The patient's blood pressure was initially      elevated, however, shortly afterwards it was within normal range.  Will      resume her prescribed home medications and adjust the doses as needed.  3.  History of hyperlipidemia.  Will resume the patient's Lipitor.  4.  History of breast cancer.  Will continue Arimidex.  5.  History of hypothyroidism.  Will resume Levoxyl.  6.  History of GERD, will provide Protonix.  7.  Questionable history of lupus.  Will check an ANA.  8.  DVT prophylaxis, will provide Lovenox.      Michaelyn Barter, M.D.  Electronically Signed     OR/MEDQ  D:  11/26/2005  T:  11/26/2005  Job:  [id number]   cc:   Janae Bridgeman. Eloise Harman., M.D.  Fax: [phone]

## 2011-02-20 NOTE — H&P (Signed)
NAME:  Amy Hahn, Amy Hahn NO.:  [id number]   MEDICAL RECORD NO.:  [id number]                   PATIENT TYPE:  OUT   LOCATION:  VASC                                 FACILITY:  [facility]   PHYSICIAN:  Reynolds Bowl, M.D.                 DATE OF BIRTH:  11-Feb-1936   DATE OF ADMISSION:  07/19/2002  DATE OF DISCHARGE:                                HISTORY & PHYSICAL   INTRODUCTION:  Amy Hahn is a 75-1/75-year-old lady with pain in the left knee  she notes all of the time, and it increases with all activities, therefore  interfering with all life activities including sleep.  She underwent a right cemented total knee arthroplasty in 11/99. She has  good results with that. She feels at this time she is ready to proceed with  left total knee arthroplasty. She has been requiring Vicodin three a day.   ALLERGIES:  She states she is allergic to penicillin and codeine, but she  tolerates Vicodin quite well.   REVIEW OF SYMPTOMS:  She is current with Paps and mammograms. She has had a  recent colonoscopy and recent cholecystectomy. She has no cardiorespiratory  symptoms.   FAMILY HISTORY:  Positive for diabetes and ASCVD.   DIAGNOSES:  1. Arthritis.  2. Breast cancer.  3. Cataracts.  4. Glaucoma.  5. Hernia.  6. High blood pressure.  7. Osteoporosis.  8. She believes she had a petit mal stroke some years ago, symptoms for one     day.   MEDICATIONS:  1. Levoxyl 50 mcg one a day.  2. Furosemide 40 mg one half q.d.  3. Coumadin 1 mg q.d. The patient stopped Coumadin today in preparation for     surgery.  4. Avapro 150 mg one half at night.  5. Pravachol 40 mg one in the morning.  6. Labetalol 10 mg one in the morning and one at night.  7. Arimidex 1 mg one at night.  8. Paxil 20 mg at night.  9. Diazepam 20 mg at night.  10.      Xalatan one drop each eye at night.   HABITS:  The patient does not smoke cigarettes or drink ethanol.   PHYSICAL  EXAMINATION:  GENERAL:  She is 5 feet 1, 218 pounds. Blood pressure  96/72, temperature 97.2, respirations 20, pulse 76.  HEENT EXAM:  She is wearing glasses. Extraocular movements full. Tympanic  membranes appear normal. Oropharynx is clear. She is wearing two dentures.  NECK:  Moves well without discomfort. There are no carotid bruits.  CHEST:  Fair volume. Breath sounds are clear. There is a Port-A-Cath in the  left anterior chest.  HEART:  Regular rhythm. No murmurs.  ABDOMEN:  Soft, round, nontender, protuberant. Bowel sounds are normal.  ORTHOPEDIC EVALUATION:  Discloses the patient walks with a varus knee on the  left and  good alignment on the right. Her femoral pulses are palpable but  there is a soft bruit on the left. Dorsalis pedis pulses are a little bit  difficult to feel but are palpable. Her ankle motors are normal. The right  knee motion is from 0 to 125 degrees. There is good alignment and no pain.  The left knee has a varus deformity. Motion is 20 to 105 degrees and is  painful. There is crepitation and pain medially. Hip range of motion is good  without pain.   LABORATORY DATA:  X-rays of the knee show good position and no loosening of  the right total knee arthroplasty. On the left, there is varus deformity  with bone on bone, and there are diffuse osteophytic changes.   ADMISSION DIAGNOSES:  1. Osteoarthritis left knee.  2. High blood pressure.  3. Glaucoma.  4. Status post right mastectomy two and a half years ago with Port-A-Cath     still in the left chest.   PLAN:  Cemented left total knee arthroplasty. She knows there are no  guarantees. There could be multiple complications. We will do our best for a  good result. Anticipate she will do well. She was given prescriptions today  for Tylox, Vicodin, and Coumadin. Fully discussed anticipated postoperative  course. Will plan on seeing her back here in 7 to 10 days postoperative.                                                Reynolds Bowl, M.D.    JWK/MEDQ  D:  07/19/2002  T:  07/20/2002  Job:  [id number]

## 2011-02-20 NOTE — Op Note (Signed)
NAME:  EQUILLA, QUE                            ACCOUNT NO.:  [id number]   MEDICAL RECORD NO.:  [id number]                   PATIENT TYPE:  OIB   LOCATION:  5713                                 FACILITY:  [facility]   PHYSICIAN:  Alford Highland. Rankin, M.D.                DATE OF BIRTH:  12-28-1935   DATE OF PROCEDURE:  02/19/2003  DATE OF DISCHARGE:                                 OPERATIVE REPORT   PREOPERATIVE DIAGNOSIS:  1. 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  2. Dense vitreous hemorrhage, left eye.   POSTOPERATIVE DIAGNOSIS:  1. 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  2. Dense vitreous hemorrhage, left eye.  3. Branch retinal artery occlusion infero nasally in the left eye secondary     to avulsed retina and its break through the retinal artery.   PROCEDURE:  1. Posterior vitrectomy with membrane peel OS.  2. Pan photocoagulation OS with retinopexy and ablation of the     inferotemporal quadrant as well as mid periphery in the previous bed of     the attachment.  3. Injection of vitreous substitute - SF6 25% OS.  4. Internal drainage through the retinal break adjacent to the optic nerve     inferonasal.   SURGEON:  Alford Highland. Rankin, M.D.   ANESTHESIA:  General endotracheal anesthesia.   INDICATIONS FOR PROCEDURE:  The patient is a 75 year old woman who has  profound vision loss with vitreous hemorrhage and last week was found to  have two retinal breaks with a partial avulsion of a peripheral retinal vein  located roughly at the 9 o'clock position in this left eye.  Subsequently  she developed profound vision loss over the weekend and was found to have  peripheral vision loss and developed a rhegmatogenous detachment macular  off.  She was seen by Guadelupe Sabin, M.D. who returned her to see me  this morning for evaluation and surgical repair.   The patient indeed was found to have rhegmatogenous detachment  and  interestingly has a large posterior retinal break through a retinal vein or  artery slightly inferonasal approximately 1 disc diameter from the optic  nerve in the inferonasal wall portion of the retina.  The patient  understands that this cannot be fixed with standard scleral buckle alone and  does require a primary posterior vitrectomy to remove the traction as well  as to reattach retina.  She understands the need for surgical repair. She  understands the risks of anesthesia including the rare occurrence of death,  but also to the eye including, but not limited to, hemorrhage, infection,  scarring, need for another surgery, no change in vision, loss of vision, and  progression of disease despite intervention. Appropriate signed consent was  obtained.  She was taken to the operating room.   DESCRIPTION OF PROCEDURE:  In the  operating room, appropriate monitors  followed by general endotracheal anesthesia.  The left periocular region was  sterilely prepped and draped in the usual ophthalmic fashion.  Lid speculum  applied.  Conjunctival peritomy fashioned temporally and supranasally.  4 mm  infusion secured 3.5 mm posterior to the limbus in the inferotemporal  quadrant.  Placement in the vitreous cavity verified visually.  Superior  sclerotomies were then fashioned.  Wilde microscope placed into position  with Biome attachment.  Core vitrectomy was then begun.  Vitreous was  mobilized off the posterior pole and tractions were released off the  posterior hole. However, there was an elevated anterior to the equator 360  degrees.  Vitreous space dissection using a portion of the retina for detach  extending from the 3 o'clock position inferiorly down and around to nearly  the 9 o'clock position.  The previous retinal holes were treated with  retinopexy in the office had remained flat.   At this time, fluid/air exchange was then completed using passive  techniques.  All subretinal  fluids were cleared in this fashion.  No other  peripheral retinal breaks had been seen nor detached.   Endolaser photocoagulation was placed peripherally as well as in the bed of  the attachment along the equator as well as finally after repeated drying  techniques around the retinal hole and its drainage site.  There were no  complications.  Instruments were removed from the eye.  Air - SF6 25%  exchange completed.   At this time, the superior sclerotomies were closed with 7-0 Vicryl sutures.  The infusion was removed and similarly closed with 7-0 Vicryl sutures.  Intraocular pressure was assessed and found to be adequate at less than 20.  Conjunctiva closed with 7-0 Vicryl suture.  Subconjunctival injections of  steroid were applied.  The patient was given Cipro for prophylaxis.  A  sterile patch and Fox shield were applied to the left eye.  The patient  tolerated the procedure well without complications.                                               Alford Highland Rankin, M.D.    GAR/MEDQ  D:  02/19/2003  T:  02/20/2003  Job:  [id number]

## 2011-02-20 NOTE — Op Note (Signed)
   Amy Hahn, Amy Hahn                           ACCOUNT NO.:  [id number]   MEDICAL RECORD NO.:  [id number]                   PATIENT TYPE:  AMB   LOCATION:  DAY                                  FACILITY:  [facility]   PHYSICIAN:  Petra Kuba, M.D.                 DATE OF BIRTH:  [birth year]   DATE OF PROCEDURE:  06/15/2002  DATE OF DISCHARGE:                                 OPERATIVE REPORT   PROCEDURE:  Colonoscopy and biopsy.   INDICATIONS FOR PROCEDURE:  A patient with a strong family history for colon  cancer and personal history of colon polyps due for repeat screening.  Consent was signed after risks, benefits, methods, and options were  thoroughly discussed in the office on multiple occasions.   MEDICINES USED:  Demerol 50, Versed 10.   DESCRIPTION OF PROCEDURE:  Rectal inspection was pertinent for external  hemorrhoids, small. Digital exam was negative. The pediatric video  adjustable colonoscope was inserted and fairly easily advanced around the  colon to the cecum. The cecum was identified by the appendiceal orifice and  the ileocecal valve. No obvious abnormality was seen on insertion. The scope  was slowly withdrawn. The prep was adequate, there was some liquid stool  that required washing and suctioning. On slow withdrawal through the colon,  two tiny descending and distal sigmoid polyps were seen and were each cold  biopsied x2 and put in the same container. No other polypoid lesions, masses  or other abnormalities were seen as we slowly withdrew back to the rectum.  Once back in the rectum, the scope was retroflexed pertinent for some  internal hemorrhoids. The scope was straightened and readvanced a short ways  up the left side of the colon, air was suctioned, scope removed. The patient  tolerated the procedure well. There was no obvious or immediate  complications.   ENDOSCOPIC DIAGNOSES:  1. Internal and external hemorrhoids.  2. Two tiny left sided polyps cold  biopsied, probably hyperplastic.  3. Otherwise within normal limits to the cecum.   PLAN:  Await pathology and probably recheck colon screening in five years.  Happy to see back p.r.n. otherwise return care to Dr. Lendell Caprice for the  customary health care maintenance to include yearly rectals and guaiacs.                                               Petra Kuba, M.D.    MEM/MEDQ  D:  06/15/2002  T:  06/16/2002  Job:  [id number]   cc:   Janae Bridgeman. Eloise Harman., M.D.  [address]  [place]  [place] [address]  Fax: [phone]

## 2011-02-20 NOTE — Cardiovascular Report (Signed)
NAME:  Amy Hahn, Amy Hahn NO.:  [id number]   MEDICAL RECORD NO.:  [id number]          PATIENT TYPE:  INP   LOCATION:  4704                         FACILITY:  [facility]   PHYSICIAN:  Ricki Rodriguez, M.D.  DATE OF BIRTH:  25-Dec-1935   DATE OF PROCEDURE:  11/30/2005  DATE OF DISCHARGE:                              CARDIAC CATHETERIZATION   REFERRING PHYSICIAN:  Dr. Brien Few of [facility].   PROCEDURE:  1.  Left heart catheterization.  2.  Selective coronary angiography.   INDICATION:  This 75 year old white female had recurrent chest pain and  inconclusive evidence of ischemia on her nuclear stress test along with  multiple risk factors and advanced age.   APPROACH:  Right femoral artery using 4-French sheath and catheters.   COMPLICATIONS:  No complications.   LEFT VENTRICULOGRAM:  Not done and unable to cross the aortic valve with a J  or a straight wire.  Ejection fraction was 65% by ultrasound.   Less than 60 mL of the dye were used.   HEMODYNAMIC DATA:  The aortic pressure was 210/91.  Post IV 1.25 mg Vasotec  and nitroglycerin 0.2 mg sublingual sprays x2, blood pressure was 160/80.   CORONARY ANATOMY:  The left main coronary artery was double-barreled.   Left anterior descending coronary artery:  The left anterior descending  coronary artery showed calcification in its proximal one-third of the  segment.  There was a 20% long eccentric lesion in the proximal area  followed by 30% to 40% tandem lesions near diagonal 2 origin.  The diagonal  1 was a very small vessel.  Diagonal 2 was a larger vessel with ostial 30%  to 40% narrowing.   Left circumflex coronary artery:  The left circumflex coronary artery had  20% proximal disease.  The ramus branch was very small.  The obtuse marginal  branch was also a small vessel.  The obtuse marginal branch 2 and 3 were  unremarkable and obtuse marginal branch 3 supplied most of the  posterolateral wall.   Right  coronary artery:  The right coronary artery was dominant, had mid-  vessel 20% stenosis.  Its marginal branch 1 was a very small vessel.  Marginal branch 2 was okay and marginal branch 3 was a long narrow vessel.  The posterolateral branch was a very small vessel and the posterior  descending coronary artery was also a relatively small vessel.   IMPRESSION:  1.  Mild multivessel native vessel coronary artery disease.  2.  Preserved left ventricular systolic function by 2-D echocardiogram.   RECOMMENDATION:  This patient will be treated medically with increasing  Lipitor to 80 mg daily and adding the Norvasc 5 mg one daily both for blood  pressure control as well as for coronary vasodilation.      Ricki Rodriguez, M.D.  Electronically Signed     ASK/MEDQ  D:  11/30/2005  T:  12/01/2005  Job:  [id number]

## 2011-02-20 NOTE — Op Note (Signed)
NAME:  Amy Hahn, Amy Hahn                            ACCOUNT NO.:  [id number]   MEDICAL RECORD NO.:  [id number]                   PATIENT TYPE:  AMB   LOCATION:  DAY                                  FACILITY:  [facility]   PHYSICIAN:  Anselm Pancoast. Zachery Dakins, M.D.          DATE OF BIRTH:  1935/11/24   DATE OF PROCEDURE:  06/20/2002  DATE OF DISCHARGE:                                 OPERATIVE REPORT   PREOPERATIVE DIAGNOSES:  1. Chronic cholecystitis.  2. History of cancer of the breast.   POSTOPERATIVE DIAGNOSES:  1. Chronic cholecystitis.  2. History of cancer of the breast.   PROCEDURE:  Laparoscopic cholecystectomy with cholangiogram.   SURGEON:  Anselm Pancoast. Zachery Dakins, M.D.   ASSISTANT:  Rose Phi. Maple Hudson, M.D.   ANESTHESIA:  General anesthesia.   ESTIMATED BLOOD LOSS:  Minimal.   HISTORY:  The patient is a 75 year old female on whom I did a right modified  radical mastectomy approximately 7-1/2 years ago, and she has multiple  positive nodes and has been on chemotherapy and now on Arimidex.  I saw her  recently for a breast follow-up, and she was telling me about gas and kind  of bloating, cramping, sort of just right of midline, and she wondered if  she had a hernia down inferior to her mastectomy incision.  One exam I found  no evidence of a hernia and thought that possibly this was her gallbladder  given the intermittent episodes of pain.  She has a history of hiatus hernia  for which she takes medications, and we obtained an ultrasound which showed  multiple stones within her gallbladder.  She has not had any more real  severe attacks like the one she had mentioned, but I recommended proceeding  with a laparoscopic cholecystectomy and she was in agreement.  Her liver  function studies were normal.  The lady's workup had shown no evidence of  metastatic disease.   DESCRIPTION OF PROCEDURE:  The patient was taken to surgery and  preoperatively was given 3 g of Unasyn and PAS  stockings and positioned on  the OR table.  The abdomen was prepped with Betadine solution and draped in  a sterile manner.  She has had a previous GYN procedure through a lower  midline incision, but I made a little incision just below the umbilicus.  The fascia was identified, picked up with two Kochers, a small opening made  into the peritoneal cavity.  A pursestring suture was placed and then the  Hasson cannula introduced.  Inspection of the upper abdomen showed a  distended gallbladder with adhesions around it but no evidence of any  metastatic disease to the liver and certainly no evidence of any hernias in  the subcostal areas either on the right or the left.  The upper 10 mm trocar  was placed just to the right of the falciform under direct vision after  anesthetizing the fascia, and two lateral 5 mm trocars were placed.  We then  grasped the gallbladder and retracted it up, where we kind of carefully  dissected the omentum, which was kind of adherent to the gallbladder, good  hemostasis obtained, and the second trocar grasper could be placed and then  we retracted the gallbladder outward.  The peritoneum was opened, the  proximal portion of the gallbladder was identified, and the cystic duct was  identified and I clipped it flush with the gallbladder.  A small opening was  made and then the [facility] catheter introduced within this and x-ray obtained,  and it showed good prompt filling with no evidence of any stones in the  common bile duct.  The intrahepatic radicles filled.  The common bile duct  is a little prominent in size but nothing  that was obviously a stone, and  then the catheter was removed and the cystic duct was carefully clipped  proximally and then divided.  Next we identified the anterior branch of the  cystic artery that was doubly clipped and then divided, and in the posterior  branch area I think I clipped it without truly identifying it.  Two clips  were placed,  and then the gallbladder was freed from its bed.  The area  where she had the adhesions lateral to the gallbladder was a little  bleeding, and hemostasis was obtained with cautery and then we placed the  gallbladder in an EndoCatch bag.  I brought the gallbladder up at the  umbilical port within the bag and withdrew it and then on inspection, we  could see where a stone had dropped laying on the omentum, something was  pulling it up to the bag.  We reinserted the Hasson cannula and grasped it,  pulled it out.  There was no evidence of any bleeding at the gallbladder  fossa area, and then the Hasson cannula was withdrawn and I placed a second  figure-of-eight at the fascia and tied both of these.  I then anesthetized  the umbilical fascia and the irrigation fluid had been aspirated.  The two  lateral 5 mm ports were withdrawn and the carbon dioxide released and the  upper 10 mm trocar withdrawn.  The subcutaneous wounds were closed with 4-0  Vicryl and Benzoin and Steri-Strips in the skin.  The patient tolerated the  procedure nicely and was sent to the recovery room in stable postop  condition.  Sponge and needle counts were correct.                                                 Anselm Pancoast. Zachery Dakins, M.D.    WJW/MEDQ  D:  06/20/2002  T:  06/20/2002  Job:  [id number]   cc:   Samul Dada, M.D.  [address] [facility]  [place]  [place] [address]  Fax: [phone]   Janae Bridgeman. Eloise Harman., M.D.  [address]  [place]  [place] [address]  Fax: [phone]

## 2011-02-20 NOTE — Letter (Signed)
April 14, 2006      Amy Hahn. Amy Caprice, MD  [address]  [place], [place] [address]    RE:  Amy Hahn  MRN #[id number]  /  DOB:  04-07-1936    Dear Amy Hahn,   I had the pleasure of seeing Amy Hahn in the office today in followup.   She appears to be doing quite well.  The only episode of chest discomfort  that she had was immediately in the post-procedural period following her  endoscopy.  They gave her some soda, and the symptoms were relieved in the  endoscopy laboratory.  She has not had any further chest pain whatsoever,  which includes exertion and multiple activities.   PHYSICAL EXAMINATION:  VITAL SIGNS:  On her examination today, her blood  pressure is 114/72, pulse 68.  LUNGS:  The lung fields are clear.  CARDIAC:  Rhythm is regular.  EXTREMITIES:  No edema.   EKG reveals a normal sinus rhythm with a leftward oriented axis.  Otherwise,  it is unremarkable.   Overall, this nice lady is doing well.  I plan to see her back in followup  in about six months.  I have told her that I think it would be reasonable  for her to discontinue her Plavix.  She did not present with an acute  coronary syndrome truly, and she did also not have a stent placed.  She can  resume her aspirin after her ophthalmologic procedure.  Should she have any  problems, we would be happy to see her at any time.   Thanks for allowing [place] to share in her care.    Sincerely,      Amy Morton. Riley Kill, MD, [facility]   TDS/MedQ  DD:  04/14/2006  DT:  04/14/2006  Job #:  [id number]   CC:  Doris Cheadle. Dione Booze, MD

## 2011-02-20 NOTE — Discharge Summary (Signed)
NAMENANCYE, GRUMBINE NO.:  [id number]   MEDICAL RECORD NO.:  [id number]                   PATIENT TYPE:   LOCATION:                                       FACILITY:   PHYSICIAN:  Reynolds Bowl, M.D.                 DATE OF BIRTH:  1935-11-08   DATE OF ADMISSION:  07/25/2002  DATE OF DISCHARGE:  07/31/2002                                 DISCHARGE SUMMARY   ADMISSION DIAGNOSES:  1. Osteoarthritis, left knee.  2. Hypertension.  3. Glaucoma.  4. Status post operative right mastectomy two and a half years ago for     carcinoma currently with Port-A-Cath still in the left chest.   OPERATIVE PROCEDURE:  07/25/02 cemented left total knee arthroplasty.   DISCHARGE DIAGNOSES:  1. Osteoarthritis, left knee.  2. Hypertension.  3. Glaucoma.  4. Status post operative right mastectomy two and a half years ago for     carcinoma currently with Port-A-Cath still in the left chest.  5. Postoperative hyponatremia and hypokalemia.   For history and physical, see note dictated on admission.   HOSPITAL COURSE:  On the date of surgery, the patient underwent cemented  left total knee arthroplasty as detailed in the operative note.  On that  date, it was estimated the blood loss was 300 cc.   Pre and postoperatively the patient was on prophylactic antibiotics.   She was begun on prophylactic Coumadin the day of surgery.   She was started on 50% weightbearing and CPM, to progress as tolerated on  the day postoperatively.   On postoperative day #3, because of a hemoglobin which had dropped to 7.8,  she was given two units of packed red blood cells.  On that same day, her  Foley was removed.  It was then noted later that day and the next evening  she was in a confused state and subsequent evaluation of her electrolytes  disclosed hyponatremia.  She was placed on normal saline IV at 75 cc a day  and her oral fluids limited to 1000 cc.  Additionally, preoperative  potassium of 4.0 had dropped to 3.2, she was, therefore, placed on K-Dur.  This fairly rapidly cleared her confusion, her potassium returned to 4.4 and  the sodium 142.  She continued to progress with her physical therapy, was  found to be healing well and on 07/31/02 she could actively straight-leg-  raise, her wound was healing well, she had no peripheral edema and she was  ambulatory 50% weightbearing.  At that point, she was discharged to home to  continue Coumadin per protocol, oxycodone or hydrocodone p.r.n. for pain,  Ambien for sleep, home physical therapy, and I was to see her in the office  in approximately a week, she was to call sooner with any concerns.   Some of the laboratory data obtained during this admission other  than those  that have been noted:  Admission sodium 142, potassium 3.6 and the low was  sodium 132, potassium 3.2 and prior to discharge sodium 141, potassium 5.0.  Chest x-ray:  Sinus bradycardia, left ventricular hypertrophy.  Chest film  was consistent with some cardiomegaly.  She had had a preoperative arterial  evaluation and felt to have essentially normal vasculature with good  perfusion.                                                Reynolds Bowl, M.D.    JWK/MEDQ  D:  08/24/2002  T:  08/24/2002  Job:  [id number]

## 2011-02-20 NOTE — Assessment & Plan Note (Signed)
[facility]                            CARDIOLOGY OFFICE NOTE   NAME:Peabody, Roby R                         MRN:          [id number]  DATE:10/11/2006                            DOB:          [birth year]    Ms. Huffine is in for followup. She really is doing quite well. She has had  cataract surgery and tolerated this nicely. She is on Plavix. She  stopped walking a few months ago, and has gained about 6 pounds. She has  had bilateral knee replacements and has some difficulty with this, but  can work on a machine at home without too much difficulty.   On her examination today, her blood pressure is 135/79, pulse is 65. The  weight is 220, which is up 15 pounds from when I saw her in July.  Her lung fields are clear.  Her cardiac rhythm is regular.   The patient has scattered coronary irregularities, but less than 50%.  Overall, she appears to be doing well.   Her EKG reveals normal sinus rhythm with left oriented axis.   Overall, the patient is stable. I plan to see her back in followup in 6  months. I could see her earlier if further problems arise, but she  appears to be stable at the present time. Continued follow up with Dr.  Lendell Caprice is recommended. She has had lab work done in his office.     Arturo Morton. Riley Kill, MD, [facility]  Electronically Signed    TDS/MedQ  DD: 10/11/2006  DT: 10/11/2006  Job #: [id number]

## 2011-02-20 NOTE — Op Note (Signed)
NAME:  Amy Hahn, Amy Hahn                            ACCOUNT NO.:  [id number]   MEDICAL RECORD NO.:  [id number]                   PATIENT TYPE:  OUT   LOCATION:  VASC                                 FACILITY:  [facility]   PHYSICIAN:  Reynolds Bowl, M.D.                 DATE OF BIRTH:  1936-01-09   DATE OF PROCEDURE:  DATE OF DISCHARGE:  07/20/2002                                 OPERATIVE REPORT   PREOPERATIVE DIAGNOSIS:  Osteoarthritis with varus deformity, left knee.   POSTOPERATIVE DIAGNOSIS:  Osteoarthritis with varus deformity, left knee.   PROCEDURE:  Left  total knee arthroplasty.   ANESTHESIA:  General.   SURGEON:  Reynolds Bowl, M.D.   ASSISTANT:  Burnard Bunting, M.D.   DESCRIPTION OF PROCEDURE:  The patient was given 1 gm of  vancomycin IV. She  was then given a general anesthetic by endotracheal tube, then a Foley  catheter was put in place. She was positioned on the operating table in a  comfortable position and all areas were padded. A pneumatic tourniquet was  applied to the proximal left thigh and then isolated with a U-drape. A bump  was placed under the left hip. She was then prepped and draped in the usual  sterile manner. We used Duraprep and [place]. The leg was exsanguinated by  elevation and the use of an Esmarch bandage. The tourniquet was inflated to  350 mmHg.   A straight  anterior incision was made centered over the patella and carried  distally, medially to the patellar tendon and proximally in the extent of  the quadriceps tendon. It was carried down through the subcutaneous fat to  the quadriceps tendon, at which point the subcutaneous veins could be seen,  and these were bovied.   The knee was then opened along the medial retinaculum and carried  proximally, leaving a small edge of the quadriceps tendon intact and carried  distally along the medial edge of the patellar tendon. The fat  pad was  excised. The  medial meniscus was partially excised and the  medial  collateral ligament and deep collateral ligament were elevated  subperiosteally back to the posteromedial corner.   Very large osteophytes from the patella all around the tibia as well as the  femur were removed. The patella was everted. The knee was flexed to 90  degrees.   The distal femur was resected 10 mm in 5 degrees of valgus using an  intramedullary guide. This  brought the distal resection very close to the  median and lateral epicondyle.   We then used a sizing tool and decided on femoral size #7 and we lightened  this up with the epidural axis, which turned out to be slightly more than 5  degrees of external rotation. With this axis  we then applied the guide for  the chamfers, and all the chamfers  were cut with the guide in place.   The cruciate ligaments were sacrificed, the tibia brought anteriorly. The  meniscectomy was completed, and we sized the tibial plateau to be size #7.  We resected 10 mm down from the lateral side using the intramedullary guide,  and this had 5 degrees of posterior tilt. Having done this, we used the  blocks and determined that the knee was too tight and we then went and  resected 2 more millimeters.   At this point then we checked and cleared around the periphery of the  tibia, medial, lateral and posterior, removing osteophytes and debris, and  then returned to the femur and completed the notch resection with the  guides, and at this point then we could put trials in. A #7 femoral trial  and a #7 tibial tray trial with a 10 mm thick bearing was used. It tracked  nicely and allowed full flexion and full extension, and the patella tracked  well.   Having established that, then attention was directed to the proximal tibia  where it was impacted to accept the fin of the  tibial tray, using the  guides for guidance, and then the patella was everted and  measured to be 22  mm thick and we resected 10 mm. A trial on that indicated we  should use  universal resurfacing #3 and with the guide, we made the peg holes and  inserted the trial #3 patellar component. Again, he had full flexion and  extension and good stability and the patella tracked nicely.   At this point then we asked for the cement to begin being mixed  and we  spent time with pulsatile lavage over all surfaces in the joint. Following  this when the  cement was ready, we buttered the clean, dry cancellous  surfaces with cement and buttered the cement side of the tibial tray with  cement, and then impacted those two together and cleaned all the peripheral  excess cement away. Likewise the femoral component was seated and cemented.  We then used a 10 mm thick tibial trial, placed the knee in extension with  this in place and then dealt with the patella.   The patella was similarly cemented and held in place with a clamp. All  excess cement was removed. We then required quite a while for the cement to  harden, but once it did,we went back, went all the way around the femoral  component and the tibial components, removing all excess cement, and looking  for bits of debris which were removed.   Having done this then, the area was again irrigated with pulsatile lavage,  and we decided to insert a 10 mm thick tibial bearing insert. The tibial  tray was well cleaned and the tibial bearing insert was seated securely.  Again the knee was in full extension, would fully flex, was stable and the  patella tracked well.  At this point we spent a little more time with  lavage. Having  completed the lavage, we let the tourniquet down and looked  for venous bleeders, several of which were bovie cauterized.   Following this the retinaculum was closed using figure-of-eight sutures and  #1 Vicryl, then 2-0 Vicryl for  the more superficial tissues and metal  staples for the skin. One suction Hemovac was placed superficial to the retinaculum and the drain hose was taken out  through a superolateral fly  portal. A bulky dressing was applied and about  this time anesthesia was  going to start doing a femoral  block.   I would estimate the blood loss at approximately 300 cc and none was  replaced. The patient was then returned to the recovery room in good  condition.                                                 Reynolds Bowl, M.D.    JWK/MEDQ  D:  07/25/2002  T:  07/25/2002  Job:  [id number]

## 2011-02-20 NOTE — Discharge Summary (Signed)
Amy Hahn, Amy Hahn NO.:  [id number]   MEDICAL RECORD NO.:  [id number]          PATIENT TYPE:  INP   LOCATION:  4704                         FACILITY:  [facility]   PHYSICIAN:  Amy Hahn, M.D.       DATE OF BIRTH:  Aug 18, 1936   DATE OF ADMISSION:  11/26/2005  DATE OF DISCHARGE:  12/01/2005                                 DISCHARGE SUMMARY   PRIMARY CARE PHYSICIAN:  Amy Hahn, M.D. with [facility].   DISCHARGE DIAGNOSES:  1.  Nonobstructive coronary artery disease.  2.  Chest wall pain.  3.  Gastroesophageal reflux disease.  4.  Hiatal hernia.  5.  Hypertension.  6.  Hypothyroidism.  7.  Hyperlipidemia.  8.  Glaucoma.  9.  History of breast cancer, status post mastectomy in 2001.  10. Status post cholecystectomy.  11. Osteoarthritis, status post bilateral knee replacements.   DISCHARGE MEDICATIONS:  1.  Labetalol 150 mg by mouth twice a day.  2.  Furosemide 40 mg by mouth Monday, Wednesday and Friday.  3.  Levoxyl 50 mcg by mouth daily.  4.  Arimidex 1 mg by mouth daily.  5.  Xalatan 1 drop each eye daily.  6.  Avapro 150 mg daily.  7.  Plavix 75 mg daily.  8.  Aspirin 81 mg daily.  9.  Norvasc 5 mg daily.  10. Aciphex 20 mg daily.  11. Valium to be taken as prior to admission.  12. Lipitor 80 mg daily.  13. Tylenol p.r.n. for pain.  14. Lidoderm patches to be applied for up to 1h to the areas of chest wall      sensitivity.   CONDITION ON DISCHARGE:  Amy Hahn will be discharged in good condition. At  the time of discharge, was instructed to followup with primary care  physician, Amy Hahn in 2 to 4 weeks and to follow up with Dr. Algie Hahn  who followed the patient in the hospital.   CONSULTATIONS DURING THIS ADMISSION:  Dr. Algie Hahn from cardiology.   PROCEDURES DURING THIS ADMISSION:  1.  On November 28, 2005, the patient underwent a nuclear medicine stress      test Myoview that showed 66% ejection fraction with  2 areas suspicious      for ischemia along the anterior apical region distally and also along      the  inferior septal base of the heart.  1.  The patient underwent a transthoracic echocardiogram on November 27, 2005 that showed preserved left ventricular ejection fraction but      inadequate study to appreciate the regional wall motion abnormalities.  2.  On November 30, 2005, Amy Hahn underwent cardiac catheterization by Dr.      Algie Hahn that showed the dominant RCA with mid 20% lesions, also LAD with      30% to 40% lesions, diagonal T with 30% to 40% lesion, overall      nonobstructive coronary artery disease, multivessel in nature.   For admission History and Physical, please refer to the dictated  H&P done by  Dr. Roxan Hahn on November 26, 2005.   BRIEFLY:  Amy Hahn a 75 year old woman with multiple risk factors presented  to the emergency room with some nonspecific chest pain. She was admitted for  further work up and observation.   HOSPITAL COURSE:  1.  Chest pain: Amy Hahn was admitted to the telemetry unit. She had 3 sets      of cardiac enzymes obtained. That were all 3 completely within normal      limits. She continued to complain of episodes of chest pain, so because      of that on November 28, 2005, she underwent a nuclear medicine stress      test. Her stress test was positive for 2 areas of suspicious ischemia      along the distal anterior apical wall. Because of that, on November 30, 2005, Miss Hahn underwent a cardiac catheterization that showed      basically a nonobstructive coronary artery disease. The plan was to      continue aggressive medical treatment with aspirin, Plavix, Lipitor in      high doses and beta-blocker and calcium channel blocker. The patient      will follow up with Dr. Algie Hahn and with her primary care physician, Dr.      Lendell Hahn.  2.  Severe gastroesophageal reflux disease: Amy Hahn was continued on her      Aciphex here in  the hospital. At this point in time, I do not feel like      her chest pain syndrome was associated with esophageal reflux.  3.  Significant anxiety: Amy Hahn was continued on doses of Valium here in      the hospital as at home.  4.  Hypothyroidism: Amy Hahn was kept on her home dose of Levoxyl without      significant adjustments.  5.  Hypertension: This seemed at times to be poorly controlled, for this      reason we have added Norvasc to her home regimen.  6.  Lupus: She reported the possibility of having systemic lupus      erythematosus. We obtained an ANA level and an anti DNA antibody test      that were both within normal limits.  7.  Tobacco abuse: Mrs Hahn was counseled against using any more tobacco      products.      Amy Hahn, M.D.  Electronically Signed     SL/MEDQ  D:  12/01/2005  T:  12/02/2005  Job:  [id number]   cc:   Ricki Rodriguez, M.D.  Fax: [phone]   Janae Bridgeman. Eloise Harman., M.D.  Fax: [phone]

## 2011-05-27 ENCOUNTER — Other Ambulatory Visit: Payer: Self-pay | Admitting: Internal Medicine

## 2011-05-27 DIAGNOSIS — Z1231 Encounter for screening mammogram for malignant neoplasm of breast: Secondary | ICD-10-CM

## 2011-05-27 DIAGNOSIS — Z853 Personal history of malignant neoplasm of breast: Secondary | ICD-10-CM

## 2011-06-25 ENCOUNTER — Other Ambulatory Visit (HOSPITAL_COMMUNITY): Payer: Self-pay | Admitting: Oncology

## 2011-06-25 ENCOUNTER — Encounter (HOSPITAL_BASED_OUTPATIENT_CLINIC_OR_DEPARTMENT_OTHER): Payer: Medicare Other | Admitting: Oncology

## 2011-06-25 DIAGNOSIS — Z170 Estrogen receptor positive status [ER+]: Secondary | ICD-10-CM

## 2011-06-25 DIAGNOSIS — J984 Other disorders of lung: Secondary | ICD-10-CM

## 2011-06-25 DIAGNOSIS — C50919 Malignant neoplasm of unspecified site of unspecified female breast: Secondary | ICD-10-CM

## 2011-06-25 DIAGNOSIS — Z853 Personal history of malignant neoplasm of breast: Secondary | ICD-10-CM

## 2011-06-25 LAB — COMPREHENSIVE METABOLIC PANEL
ALT: 10 U/L (ref 0–35)
AST: 21 U/L (ref 0–37)
CO2: 25 mEq/L (ref 19–32)
Calcium: 9.2 mg/dL (ref 8.4–10.5)
Chloride: 106 mEq/L (ref 96–112)
Sodium: 141 mEq/L (ref 135–145)
Total Bilirubin: 0.5 mg/dL (ref 0.3–1.2)
Total Protein: 6.5 g/dL (ref 6.0–8.3)

## 2011-06-25 LAB — LACTATE DEHYDROGENASE: LDH: 173 U/L (ref 94–250)

## 2011-06-25 LAB — CBC WITH DIFFERENTIAL/PLATELET
BASO%: 0.6 % (ref 0.0–2.0)
Eosinophils Absolute: 0.2 10*3/uL (ref 0.0–0.5)
MONO#: 0.6 10*3/uL (ref 0.1–0.9)
NEUT#: 4.8 10*3/uL (ref 1.5–6.5)
RBC: 3.64 10*6/uL — ABNORMAL LOW (ref 3.70–5.45)
RDW: 13.2 % (ref 11.2–14.5)
WBC: 7.1 10*3/uL (ref 3.9–10.3)

## 2011-07-13 ENCOUNTER — Ambulatory Visit
Admission: RE | Admit: 2011-07-13 | Discharge: 2011-07-13 | Disposition: A | Discharge status: Home / Self Care | Payer: Medicare Other | Source: Ambulatory Visit | Attending: Internal Medicine | Admitting: Internal Medicine

## 2011-07-13 DIAGNOSIS — Z1231 Encounter for screening mammogram for malignant neoplasm of breast: Secondary | ICD-10-CM

## 2011-07-13 DIAGNOSIS — Z853 Personal history of malignant neoplasm of breast: Secondary | ICD-10-CM

## 2011-12-23 DIAGNOSIS — E039 Hypothyroidism, unspecified: Secondary | ICD-10-CM | POA: Diagnosis not present

## 2011-12-23 DIAGNOSIS — I10 Essential (primary) hypertension: Secondary | ICD-10-CM | POA: Diagnosis not present

## 2011-12-23 DIAGNOSIS — R7309 Other abnormal glucose: Secondary | ICD-10-CM | POA: Diagnosis not present

## 2011-12-28 DIAGNOSIS — I10 Essential (primary) hypertension: Secondary | ICD-10-CM | POA: Diagnosis not present

## 2011-12-28 DIAGNOSIS — R7309 Other abnormal glucose: Secondary | ICD-10-CM | POA: Diagnosis not present

## 2011-12-28 DIAGNOSIS — E785 Hyperlipidemia, unspecified: Secondary | ICD-10-CM | POA: Diagnosis not present

## 2011-12-28 DIAGNOSIS — E039 Hypothyroidism, unspecified: Secondary | ICD-10-CM | POA: Diagnosis not present

## 2012-02-11 DIAGNOSIS — H4011X0 Primary open-angle glaucoma, stage unspecified: Secondary | ICD-10-CM | POA: Diagnosis not present

## 2012-02-11 DIAGNOSIS — H3530 Unspecified macular degeneration: Secondary | ICD-10-CM | POA: Diagnosis not present

## 2012-02-11 DIAGNOSIS — H409 Unspecified glaucoma: Secondary | ICD-10-CM | POA: Diagnosis not present

## 2012-02-11 DIAGNOSIS — Z961 Presence of intraocular lens: Secondary | ICD-10-CM | POA: Diagnosis not present

## 2012-03-22 DIAGNOSIS — H1045 Other chronic allergic conjunctivitis: Secondary | ICD-10-CM | POA: Diagnosis not present

## 2012-03-22 DIAGNOSIS — H04129 Dry eye syndrome of unspecified lacrimal gland: Secondary | ICD-10-CM | POA: Diagnosis not present

## 2012-03-22 DIAGNOSIS — Z961 Presence of intraocular lens: Secondary | ICD-10-CM | POA: Diagnosis not present

## 2012-03-22 DIAGNOSIS — H3530 Unspecified macular degeneration: Secondary | ICD-10-CM | POA: Diagnosis not present

## 2012-03-29 DIAGNOSIS — R21 Rash and other nonspecific skin eruption: Secondary | ICD-10-CM | POA: Diagnosis not present

## 2012-04-27 ENCOUNTER — Ambulatory Visit (INDEPENDENT_AMBULATORY_CARE_PROVIDER_SITE_OTHER): Payer: Medicare Other | Admitting: Cardiology

## 2012-04-27 VITALS — BP 172/90 | HR 62 | Ht 61.0 in | Wt 220.8 lb

## 2012-04-27 DIAGNOSIS — R42 Dizziness and giddiness: Secondary | ICD-10-CM | POA: Diagnosis not present

## 2012-04-27 DIAGNOSIS — R079 Chest pain, unspecified: Secondary | ICD-10-CM | POA: Insufficient documentation

## 2012-04-27 DIAGNOSIS — I10 Essential (primary) hypertension: Secondary | ICD-10-CM

## 2012-04-27 DIAGNOSIS — R002 Palpitations: Secondary | ICD-10-CM | POA: Diagnosis not present

## 2012-04-27 NOTE — Assessment & Plan Note (Signed)
Feels like she gets rapid palpitations at times.  Will order an event monitor.  She is treated for OSA.  Wears CPAP.  Will see if she has anything that is unifying in terms of symptoms.

## 2012-04-27 NOTE — Assessment & Plan Note (Signed)
Has non obstructive CAD by 2007 cath.  Current symptoms not typical, and not exertion related.  Lots of emotional stress recently.  Will see her back in follow up before deciding on any new testing.

## 2012-04-27 NOTE — Progress Notes (Signed)
HPI:  I did not see the patient in about 3 years. She under a lot of stress. The patient's son had a myocardial infarction. At times she feels her heart flutter. She also has intermittent periods of difficulty with balance. About 3 months she's had some dizziness and was worried about her carotids. She at times feels off-balance. She is also noted little bit of chest discomfort for about 3 months as well she is seen Dr. Selena Batten recently. She denies any chest discomfort with exertion, and there is no clearcut radiation. She did not have diaphoresis or nausea or other typical symptoms.  Current Outpatient Prescriptions  Medication Sig Dispense Refill  . amLODipine (NORVASC) 5 MG tablet Take 5 mg by mouth daily.       [name] aspirin 81 MG tablet Take 81 mg by mouth daily.      . diazepam (VALIUM) 5 MG tablet Take 5 mg by mouth as directed.       . dorzolamide-timolol (COSOPT) 22.3-6.8 MG/ML ophthalmic solution Place 1 drop into both eyes as directed.       . furosemide (LASIX) 40 MG tablet Take 40 mg by mouth daily.      [name] HYDROcodone-acetaminophen (LORTAB) 7.5-500 MG per tablet Take 1 tablet by mouth.       . irbesartan (AVAPRO) 150 MG tablet Take 150 mg by mouth at bedtime.      [name] labetalol (NORMODYNE) 100 MG tablet Take 150 mg by mouth 2 (two) times daily.       [name] latanoprost (XALATAN) 0.005 % ophthalmic solution Place 1 drop into both eyes at bedtime.      . NON FORMULARY CPAP machine      . omeprazole (PRILOSEC) 20 MG capsule Take 20 mg by mouth daily.       . rosuvastatin (CRESTOR) 10 MG tablet Take 10 mg by mouth daily.        Allergies  Allergen Reactions  . Belviq (Lorcaserin Hcl)   . Latex     Past Medical History  Diagnosis Date  . Breast cancer   . Macular degeneration   . Glaucoma   . Sleep apnea   . Cataracts, bilateral     Past Surgical History  Procedure Date  . Cesarean section   . Cataract extraction, bilateral   . Total knee arthroplasty     x2    History reviewed.  No pertinent family history.  History   Social History  . Marital Status: Married    Spouse Name: N/A    Number of Children: N/A  . Years of Education: N/A   Occupational History  . Not on file.   Social History Main Topics  . Smoking status: Former Games developer  . Smokeless tobacco: Not on file  . Alcohol Use: No  . Drug Use: No  . Sexually Active:    Other Topics Concern  . Not on file   Social History Narrative  . No narrative on file    ROS: Please see the HPI.  All other systems reviewed and negative.  PHYSICAL EXAM:  BP 172/90  Pulse 62  Ht 5\' 1"  (1.549 m)  Wt 220 lb 12.8 oz (100.154 kg)  BMI 41.72 kg/m2 160 by me.  About 150 with standing but hard to hear because of size of arm.    General: Well developed, well nourished, in no acute distress. Head:  Normocephalic and atraumatic. Neck: no JVD.  No carotid bruits.   Lungs: Clear to auscultation  and percussion. Heart: Normal S1 and S2.  No murmur, rubs or gallops.  Pulses: Pulses normal in all 4 extremities. Extremities: No clubbing or cyanosis. No edema. Neurologic: Alert and oriented x 3.  EKG:  SB with first degree av block.  Leftward axis.  Voltage criteria for LVH.    ASSESSMENT AND PLAN:

## 2012-04-27 NOTE — Assessment & Plan Note (Signed)
Elevated today but was ok at the office of Dr. Selena Batten.

## 2012-04-27 NOTE — Patient Instructions (Signed)
Your physician has requested that you have a carotid duplex. This test is an ultrasound of the carotid arteries in your neck. It looks at blood flow through these arteries that supply the brain with blood. Allow one hour for this exam. There are no restrictions or special instructions.  Your physician recommends that you schedule a follow-up appointment in: 3 MONTHS with Dr Riley Kill  Your physician has recommended that you wear an event monitor. Event monitors are medical devices that record the heart's electrical activity. Doctors most often [place] these monitors to diagnose arrhythmias. Arrhythmias are problems with the speed or rhythm of the heartbeat. The monitor is a small, portable device. You can wear one while you do your normal daily activities. This is usually used to diagnose what is causing palpitations/syncope (passing out).

## 2012-04-27 NOTE — Assessment & Plan Note (Signed)
Her balance issues are difficult to discern.  Some occurs when she stands up.  But not always.  Wants carotid studies done.  Will order.

## 2012-05-09 ENCOUNTER — Encounter: Payer: Self-pay | Admitting: Internal Medicine

## 2012-05-09 ENCOUNTER — Encounter (INDEPENDENT_AMBULATORY_CARE_PROVIDER_SITE_OTHER): Payer: Medicare Other

## 2012-05-09 DIAGNOSIS — I6529 Occlusion and stenosis of unspecified carotid artery: Secondary | ICD-10-CM | POA: Diagnosis not present

## 2012-05-09 DIAGNOSIS — R002 Palpitations: Secondary | ICD-10-CM

## 2012-05-09 DIAGNOSIS — R42 Dizziness and giddiness: Secondary | ICD-10-CM

## 2012-05-09 DIAGNOSIS — I10 Essential (primary) hypertension: Secondary | ICD-10-CM

## 2012-05-24 ENCOUNTER — Other Ambulatory Visit: Payer: Self-pay

## 2012-05-24 DIAGNOSIS — R221 Localized swelling, mass and lump, neck: Secondary | ICD-10-CM

## 2012-05-30 ENCOUNTER — Telehealth: Payer: Self-pay

## 2012-05-30 ENCOUNTER — Ambulatory Visit (HOSPITAL_COMMUNITY)
Admission: RE | Admit: 2012-05-30 | Discharge: 2012-05-30 | Disposition: A | Discharge status: Home / Self Care | Payer: Medicare Other | Source: Ambulatory Visit | Attending: Cardiology | Admitting: Cardiology

## 2012-05-30 DIAGNOSIS — E042 Nontoxic multinodular goiter: Secondary | ICD-10-CM | POA: Diagnosis not present

## 2012-05-30 DIAGNOSIS — R221 Localized swelling, mass and lump, neck: Secondary | ICD-10-CM

## 2012-05-30 DIAGNOSIS — E049 Nontoxic goiter, unspecified: Secondary | ICD-10-CM | POA: Diagnosis not present

## 2012-05-30 NOTE — Telephone Encounter (Signed)
I spoke with the pt about the results of her heart monitor.  05/28/12 strip shows a 3.2 second pause and SB.  [employer] did contact the pt and they documented that she was asymptomatic at that time and was going to the bathroom.  I spoke with the pt and she said that she went to the bathroom to urinate and then laid down in bed to do eye drops.  The pt denies having a bowel movement at that time.  I discussed monitor results with Dr Ladona Ridgel DOD and he recommended that the pt stop Labetalol (pt was taking 100mg  bid) and increase Avapro to 300mg  daily.  The pt has been scheduled to see Dr Ladona Ridgel on 06/02/12 at 8:15 in the [facility] office for EP evaluation. I spoke with the pt and she will stop Labetalol.  The pt said her BP has been okay and it was only high at her office visit with Dr Riley Kill.  Because the pt does not monitor her BP at home I instructed her to get a BP cuff and check BP.  It the pt's BP at home is 140/90 and above then she will increase Avapro.  Dr Ladona Ridgel also discussed that the pt's eye drops may need to be changed due to Dorzolamide-Timolol. The pt agrees with plan and will follow-up with Dr Ladona Ridgel on Thursday. I will also forward this message to Dr Riley Kill to make him aware of monitor results.

## 2012-05-31 ENCOUNTER — Telehealth: Payer: Self-pay | Admitting: Cardiology

## 2012-05-31 NOTE — Telephone Encounter (Signed)
I spoke with the pt and updated her medication list over the phone.  I instructed the pt to bring all medications into her appointment on 06/02/12 for review during office visit.  At this time the pt has not checked her BP.  I made the pt aware that if she checks her BP and it is above 140/90 then she can take an extra Amlodipine 5mg  daily.  Pt agrees with plan and will see Dr Ladona Ridgel on Thursday.

## 2012-05-31 NOTE — Telephone Encounter (Signed)
PT CALLING RE HEART MONITOR AND HAD A PROBLEM, STOPPED A MED YESTERDAY, PT WAS TO TAKE AVAPRO IF BP GOT TO HIGH, SHE IS NOT ON IT SINCE 2010, PLS CALL

## 2012-06-02 ENCOUNTER — Encounter: Payer: Self-pay | Admitting: Internal Medicine

## 2012-06-02 ENCOUNTER — Ambulatory Visit (INDEPENDENT_AMBULATORY_CARE_PROVIDER_SITE_OTHER): Payer: Medicare Other | Admitting: Internal Medicine

## 2012-06-02 ENCOUNTER — Ambulatory Visit: Payer: Medicare Other | Admitting: Internal Medicine

## 2012-06-02 VITALS — BP 142/80 | HR 66 | Ht 61.0 in | Wt 223.2 lb

## 2012-06-02 DIAGNOSIS — R42 Dizziness and giddiness: Secondary | ICD-10-CM

## 2012-06-02 DIAGNOSIS — I10 Essential (primary) hypertension: Secondary | ICD-10-CM | POA: Diagnosis not present

## 2012-06-02 DIAGNOSIS — R001 Bradycardia, unspecified: Secondary | ICD-10-CM

## 2012-06-02 DIAGNOSIS — R079 Chest pain, unspecified: Secondary | ICD-10-CM

## 2012-06-02 DIAGNOSIS — I498 Other specified cardiac arrhythmias: Secondary | ICD-10-CM

## 2012-06-02 MED ORDER — AMLODIPINE BESYLATE 10 MG PO TABS: 10.0000 mg | ORAL_TABLET | Freq: Every day | ORAL | Status: DC

## 2012-06-02 NOTE — Assessment & Plan Note (Signed)
Her blood pressure has been difficult to control the past. I've recommended that she increase her amlodipine to 10 mg daily. She may require additional therapy with vasodilators.

## 2012-06-02 NOTE — Assessment & Plan Note (Signed)
I suspect her symptoms are multifactorial. She is clearly weak. I wonder if she has developed posterior circulation problems in the setting of long-standing severe hypertension.

## 2012-06-02 NOTE — Progress Notes (Signed)
HPI Amy Hahn is referred today by Dr. Riley Kill for evaluation of bradycardia. The patient is a very pleasant 76 year old woman with obesity, hypertension, and dizzy spells. She has never had frank syncope. Over the last several months, she has had increasing fatigue and weakness. She also notes shortness of breath with exertion. Her episodes of dizziness will come on suddenly and last only a few seconds. She has become more sedentary. She notes trouble walking. Because of the above symptoms, she were cardiac monitor which demonstrated daytime bradycardia with pauses of over 3 seconds. The patient was taking beta blockers and these have been discontinued. She denies peripheral edema. No tachycardia palpitations. Allergies  Allergen Reactions  . Belviq (Lorcaserin Hcl)   . Latex   . Morphine And Related      Current Outpatient Prescriptions  Medication Sig Dispense Refill  . amLODipine (NORVASC) 10 MG tablet Take 1 tablet (10 mg total) by mouth daily.  90 tablet  3  . aspirin 81 MG tablet Take 81 mg by mouth daily.      . Azilsartan Medoxomil (EDARBI) 80 MG TABS Take 40 mg by mouth daily.      . diazepam (VALIUM) 5 MG tablet Take 5 mg by mouth as directed.       . dorzolamide-timolol (COSOPT) 22.3-6.8 MG/ML ophthalmic solution Place 1 drop into both eyes as directed.       . furosemide (LASIX) 40 MG tablet Take 20 mg by mouth daily.       [name] HYDROcodone-acetaminophen (LORTAB) 7.5-500 MG per tablet Take 1 tablet by mouth.       . latanoprost (XALATAN) 0.005 % ophthalmic solution Place 1 drop into both eyes at bedtime.      . multivitamin-lutein (OCUVITE-LUTEIN) CAPS Take 1 capsule by mouth daily.      . NON FORMULARY CPAP machine       . omeprazole (PRILOSEC) 20 MG capsule Take 20 mg by mouth every other day.       . rosuvastatin (CRESTOR) 10 MG tablet Take 10 mg by mouth. Monday and Friday      . DISCONTD: amLODipine (NORVASC) 5 MG tablet Take 5 mg by mouth daily.          Past Medical  History  Diagnosis Date  . Breast cancer   . Macular degeneration   . Glaucoma   . Sleep apnea   . Cataracts, bilateral     ROS:   All systems reviewed and negative except as noted in the HPI.   Past Surgical History  Procedure Date  . Cesarean section   . Cataract extraction, bilateral   . Total knee arthroplasty     x2     No family history on file.   History   Social History  . Marital Status: Married    Spouse Name: N/A    Number of Children: N/A  . Years of Education: N/A   Occupational History  . Not on file.   Social History Main Topics  . Smoking status: Former Games developer  . Smokeless tobacco: Not on file  . Alcohol Use: No  . Drug Use: No  . Sexually Active:    Other Topics Concern  . Not on file   Social History Narrative  . No narrative on file     BP 142/80  Pulse 66  Ht 5\' 1"  (1.549 m)  Wt 223 lb 4 oz (101.266 kg)  BMI 42.18 kg/m2  Physical Exam:  Well appearing obese, 77 year old  woman, NAD HEENT: Unremarkable Neck:  No JVD, no thyromegally Lungs:  Clear with no wheezes, rales, or rhonchi. HEART:  Regular rate rhythm, no murmurs, no rubs, no clicks Abd:  soft, positive bowel sounds, no organomegally, no rebound, no guarding Ext:  2 plus pulses, no edema, no cyanosis, no clubbing Skin:  No rashes no nodules Neuro:  CN II through XII intact, motor grossly intact  EKG Sinus bradycardia  Assess/Plan:

## 2012-06-02 NOTE — Assessment & Plan Note (Signed)
Her symptoms are stable. She will followup with Dr. Riley Kill in several weeks.

## 2012-06-02 NOTE — Assessment & Plan Note (Signed)
The patient is a very difficult historian. It is clear however that she has not had frank syncope. She has lots of dizziness. I suspect her dizziness is multifactorial. She clearly has bradycardia. I suspect bradycardia is part of her dizziness. We have recommended discontinuation of her beta blocker, labetalol. Hopefully her symptoms will improve. At this point she has documented bradycardia off of beta blockers, and permanent pacemaker insertion would be recommended. Hopefully we can avoid a pacemaker at this point.

## 2012-06-02 NOTE — Patient Instructions (Signed)
Your physician recommends that you schedule a follow-up appointment as needed with Dr. Ladona Ridgel.  Increase Amlodipine to 10mg  daily.

## 2012-06-04 ENCOUNTER — Telehealth: Payer: Self-pay | Admitting: Oncology

## 2012-06-04 NOTE — Telephone Encounter (Signed)
S/w pt re appt for 10/7.

## 2012-06-07 ENCOUNTER — Other Ambulatory Visit: Payer: Self-pay | Admitting: Internal Medicine

## 2012-06-07 DIAGNOSIS — Z1231 Encounter for screening mammogram for malignant neoplasm of breast: Secondary | ICD-10-CM

## 2012-06-17 ENCOUNTER — Ambulatory Visit (INDEPENDENT_AMBULATORY_CARE_PROVIDER_SITE_OTHER): Payer: Medicare Other | Admitting: Cardiology

## 2012-06-17 ENCOUNTER — Encounter: Payer: Self-pay | Admitting: Cardiology

## 2012-06-17 VITALS — HR 66 | Ht 61.0 in | Wt 219.1 lb

## 2012-06-17 DIAGNOSIS — R42 Dizziness and giddiness: Secondary | ICD-10-CM | POA: Diagnosis not present

## 2012-06-17 DIAGNOSIS — I6529 Occlusion and stenosis of unspecified carotid artery: Secondary | ICD-10-CM

## 2012-06-17 DIAGNOSIS — I10 Essential (primary) hypertension: Secondary | ICD-10-CM | POA: Diagnosis not present

## 2012-06-17 DIAGNOSIS — I498 Other specified cardiac arrhythmias: Secondary | ICD-10-CM | POA: Diagnosis not present

## 2012-06-17 DIAGNOSIS — R079 Chest pain, unspecified: Secondary | ICD-10-CM

## 2012-06-17 DIAGNOSIS — R001 Bradycardia, unspecified: Secondary | ICD-10-CM

## 2012-06-17 NOTE — Progress Notes (Signed)
HPI:  Patient returns today for followup visit. In general, she is stable.  She's not having any new problems.   We had her seen by Dr. Ladona Ridgel because of bradycardia, and this was associated with some dizziness and weakness. She seems to be somewhat better since her medicines cut back. Because of her hypertension, Dr. Ladona Ridgel and increased her amlodipine to 10 mg which she is taking regularly. She's not had significant edema associated with this today. They do have problems getting her BP, and I was able to get it today.  She requires a large cuff.  Also, the patient had carotid dopplers which revealed 40-59% on the right and 0-39% on the left.  She also had a nodule of mixed density on the anterior R in front of the bifurcation of th external and internal carotid.  As such, an [place] was ordered.  This revealed the following:  THYROID ULTRASOUND  Technique: Ultrasound examination of the thyroid gland and adjacent soft tissues was performed.  Comparison: None.  Findings:  Right thyroid lobe: 15 x 25 x 48 mm, inhomogeneous Left thyroid lobe: 24 x 28 x 62 mm Isthmus: 11 mm in thickness  Focal nodules: 6 x 7 x 8 mm hypoechoic solid, mid-left 4 x 6 x 6 mm hypoechoic solid, mid-left 10 x 11 x 14 mm complex mostly solid, inferior left 8 x 9 x 9 mm solid, right isthmus  Lymphadenopathy: None. A few sub centimeter bilateral cervical nodes are noted, none pathologically enlarged by size criteria.  IMPRESSION:  1. Thyromegaly. 2. Small left lobe and isthmic nodules. Findings do not meet current SRU consensus criteria for biopsy. Follow-up by clinical exam is recommended. If patient has known risk factors for thyroid carcinoma, consider follow-up ultrasound in 12 months. If patient is clinically hyperthyroid, consider nuclear medicine thyroid uptake and scan.   Original Report Authenticated By: Osa Craver, M.D.        Last Resulted: 05/30/12 1:06 PM       Current Outpatient  Prescriptions  Medication Sig Dispense Refill  . amLODipine (NORVASC) 10 MG tablet Take 1 tablet (10 mg total) by mouth daily.  90 tablet  3  . aspirin 81 MG tablet Take 81 mg by mouth daily.      . Azilsartan Medoxomil (EDARBI) 80 MG TABS Take 40 mg by mouth daily.      . diazepam (VALIUM) 5 MG tablet Take 5 mg by mouth as directed.       . dorzolamide-timolol (COSOPT) 22.3-6.8 MG/ML ophthalmic solution Place 1 drop into both eyes as directed.       . furosemide (LASIX) 40 MG tablet Take 20 mg by mouth daily.       [name] HYDROcodone-acetaminophen (LORTAB) 7.5-500 MG per tablet Take 1 tablet by mouth.       . latanoprost (XALATAN) 0.005 % ophthalmic solution Place 1 drop into both eyes at bedtime.      . multivitamin-lutein (OCUVITE-LUTEIN) CAPS Take 1 capsule by mouth daily.      . NON FORMULARY CPAP machine       . omeprazole (PRILOSEC) 20 MG capsule Take 20 mg by mouth every other day.       . rosuvastatin (CRESTOR) 10 MG tablet Take 10 mg by mouth. Monday and Friday        Allergies  Allergen Reactions  . Belviq (Lorcaserin Hcl)   . Latex   . Morphine And Related     Past Medical History  Diagnosis Date  . Breast cancer   . Macular degeneration   . Glaucoma   . Sleep apnea   . Cataracts, bilateral     Past Surgical History  Procedure Date  . Cesarean section   . Cataract extraction, bilateral   . Total knee arthroplasty     x2    No family history on file.  History   Social History  . Marital Status: Married    Spouse Name: N/A    Number of Children: N/A  . Years of Education: N/A   Occupational History  . Not on file.   Social History Main Topics  . Smoking status: Former Games developer  . Smokeless tobacco: Not on file  . Alcohol Use: No  . Drug Use: No  . Sexually Active:    Other Topics Concern  . Not on file   Social History Narrative  . No narrative on file    ROS: Please see the HPI.  All other systems reviewed and negative.  PHYSICAL  EXAM:  Pulse 66  Ht 5\' 1"  (1.549 m)  Wt 219 lb 1.9 oz (99.392 kg)  BMI 41.40 kg/m2  SpO2 99%  BP 170/90 by me.    General: Well developed, well nourished, in no acute distress. Head:  Normocephalic and atraumatic. Neck: no JVD Lungs: Clear to auscultation and percussion. Heart: Normal S1 and S2.  No murmur, rubs or gallops.  Abdomen:  Normal bowel sounds; soft; non tender; no organomegaly Pulses: Pulses normal in all 4 extremities. Extremities: No clubbing or cyanosis. No edema. Neurologic: Alert and oriented x 3.  EKG:  NSR with borderline first degree av block.  Thyroid US    THYROID ULTRASOUND  Technique: Ultrasound examination of the thyroid gland and adjacent soft tissues was performed.  Comparison: None.  Findings:  Right thyroid lobe: 15 x 25 x 48 mm, inhomogeneous Left thyroid lobe: 24 x 28 x 62 mm Isthmus: 11 mm in thickness  Focal nodules: 6 x 7 x 8 mm hypoechoic solid, mid-left 4 x 6 x 6 mm hypoechoic solid, mid-left 10 x 11 x 14 mm complex mostly solid, inferior left 8 x 9 x 9 mm solid, right isthmus  Lymphadenopathy: None. A few sub centimeter bilateral cervical nodes are noted, none pathologically enlarged by size criteria.  IMPRESSION:  1. Thyromegaly. 2. Small left lobe and isthmic nodules. Findings do not meet current SRU consensus criteria for biopsy. Follow-up by clinical exam is recommended. If patient has known risk factors for thyroid carcinoma, consider follow-up ultrasound in 12 months. If patient is clinically hyperthyroid, consider nuclear medicine thyroid uptake and scan.   Original Report Authenticated By: Osa Craver, M.D.        Last Resulted: 05/30/12 1:06 PM      ASSESSMENT AND PLAN:

## 2012-06-17 NOTE — Patient Instructions (Addendum)
Your physician has requested that you regularly monitor and record your blood pressure readings at home. Please use the same machine at the same time of day to check your readings and record them to bring to your follow-up visit. (Please check BP every other day)  Your physician recommends that you schedule a follow-up appointment in: 3 WEEKS with Dr Riley Kill

## 2012-06-19 DIAGNOSIS — I6529 Occlusion and stenosis of unspecified carotid artery: Secondary | ICD-10-CM | POA: Insufficient documentation

## 2012-06-19 NOTE — Assessment & Plan Note (Signed)
Adjustments have been made because of her medication. I will have her followup with Dr. Selena Batten, and potentially her ARB can be increased if necessary for better blood pressure control.

## 2012-06-19 NOTE — Assessment & Plan Note (Signed)
A carotid Doppler study was performed, and did demonstrate some disease. He does not appear to be severe, and should be followed serially in the future. Incidental finding was also that of a mixed density nodule anterior to the carotids. I do not feel as well. An ultrasound of the thyroid is occluded, and makes mention of the cervical chain. I will defer further evaluation of this, and serial follow up to Dr. Selena Batten.

## 2012-06-19 NOTE — Assessment & Plan Note (Signed)
This seems to be clinically improved at the present time. Dr. Ladona Ridgel did not feel that she needed permanent pacing at present.  She is now off of her beta blocker.  We will continue to monitor her clinically at the present time.  She is agreeable to this strategy.

## 2012-06-19 NOTE — Assessment & Plan Note (Signed)
Seems resolved at the present, so we will continue to monitor her.

## 2012-06-19 NOTE — Assessment & Plan Note (Signed)
She's not currently having much in the way of chest pain. The patient did undergo cardiac catheterization 2007, and had nonobstructive disease. She has increasing symptoms, and some consideration be given to radionuclide evaluation  to rule out significant ischemia.

## 2012-06-22 ENCOUNTER — Ambulatory Visit: Payer: Medicare Other | Admitting: Cardiology

## 2012-06-23 DIAGNOSIS — M818 Other osteoporosis without current pathological fracture: Secondary | ICD-10-CM | POA: Diagnosis not present

## 2012-06-23 DIAGNOSIS — Z23 Encounter for immunization: Secondary | ICD-10-CM | POA: Diagnosis not present

## 2012-06-23 DIAGNOSIS — I10 Essential (primary) hypertension: Secondary | ICD-10-CM | POA: Diagnosis not present

## 2012-06-23 DIAGNOSIS — R7309 Other abnormal glucose: Secondary | ICD-10-CM | POA: Diagnosis not present

## 2012-06-23 DIAGNOSIS — E039 Hypothyroidism, unspecified: Secondary | ICD-10-CM | POA: Diagnosis not present

## 2012-06-29 DIAGNOSIS — I10 Essential (primary) hypertension: Secondary | ICD-10-CM | POA: Diagnosis not present

## 2012-06-29 DIAGNOSIS — E039 Hypothyroidism, unspecified: Secondary | ICD-10-CM | POA: Diagnosis not present

## 2012-06-29 DIAGNOSIS — Z23 Encounter for immunization: Secondary | ICD-10-CM | POA: Diagnosis not present

## 2012-06-29 DIAGNOSIS — E785 Hyperlipidemia, unspecified: Secondary | ICD-10-CM | POA: Diagnosis not present

## 2012-06-29 DIAGNOSIS — I2510 Atherosclerotic heart disease of native coronary artery without angina pectoris: Secondary | ICD-10-CM | POA: Diagnosis not present

## 2012-06-30 ENCOUNTER — Encounter: Payer: Self-pay | Admitting: Cardiology

## 2012-07-08 ENCOUNTER — Other Ambulatory Visit: Payer: Self-pay

## 2012-07-08 DIAGNOSIS — C50919 Malignant neoplasm of unspecified site of unspecified female breast: Secondary | ICD-10-CM

## 2012-07-11 ENCOUNTER — Other Ambulatory Visit: Payer: Self-pay | Admitting: Oncology

## 2012-07-11 ENCOUNTER — Ambulatory Visit (HOSPITAL_COMMUNITY)
Admission: RE | Admit: 2012-07-11 | Discharge: 2012-07-11 | Disposition: A | Discharge status: Home / Self Care | Payer: Medicare Other | Source: Ambulatory Visit | Attending: Oncology | Admitting: Oncology

## 2012-07-11 ENCOUNTER — Encounter: Payer: Self-pay | Admitting: Oncology

## 2012-07-11 ENCOUNTER — Other Ambulatory Visit (HOSPITAL_BASED_OUTPATIENT_CLINIC_OR_DEPARTMENT_OTHER): Payer: Medicare Other | Admitting: Lab

## 2012-07-11 ENCOUNTER — Ambulatory Visit (HOSPITAL_BASED_OUTPATIENT_CLINIC_OR_DEPARTMENT_OTHER): Payer: Medicare Other | Admitting: Oncology

## 2012-07-11 ENCOUNTER — Ambulatory Visit: Payer: Medicare Other | Admitting: Cardiology

## 2012-07-11 ENCOUNTER — Telehealth: Payer: Self-pay | Admitting: Oncology

## 2012-07-11 VITALS — BP 178/94 | HR 64 | Temp 97.2°F | Resp 20 | Ht 61.0 in | Wt 222.0 lb

## 2012-07-11 DIAGNOSIS — C50919 Malignant neoplasm of unspecified site of unspecified female breast: Secondary | ICD-10-CM | POA: Diagnosis not present

## 2012-07-11 DIAGNOSIS — R911 Solitary pulmonary nodule: Secondary | ICD-10-CM | POA: Diagnosis not present

## 2012-07-11 DIAGNOSIS — E049 Nontoxic goiter, unspecified: Secondary | ICD-10-CM

## 2012-07-11 DIAGNOSIS — Z853 Personal history of malignant neoplasm of breast: Secondary | ICD-10-CM

## 2012-07-11 DIAGNOSIS — C50911 Malignant neoplasm of unspecified site of right female breast: Secondary | ICD-10-CM

## 2012-07-11 LAB — COMPREHENSIVE METABOLIC PANEL (CC13)
Albumin: 3.4 g/dL — ABNORMAL LOW (ref 3.5–5.0)
Alkaline Phosphatase: 89 U/L (ref 40–150)
BUN: 20 mg/dL (ref 7.0–26.0)
CO2: 24 mEq/L (ref 22–29)
Glucose: 119 mg/dl — ABNORMAL HIGH (ref 70–99)
Potassium: 3.6 mEq/L (ref 3.5–5.1)
Total Bilirubin: 0.4 mg/dL (ref 0.20–1.20)

## 2012-07-11 LAB — CBC WITH DIFFERENTIAL/PLATELET
Basophils Absolute: 0 10*3/uL (ref 0.0–0.1)
Eosinophils Absolute: 0.1 10*3/uL (ref 0.0–0.5)
HGB: 13.1 g/dL (ref 11.6–15.9)
LYMPH%: 37.6 % (ref 14.0–49.7)
MCV: 97.5 fL (ref 79.5–101.0)
MONO#: 0.6 10*3/uL (ref 0.1–0.9)
MONO%: 9 % (ref 0.0–14.0)
NEUT#: 3.1 10*3/uL (ref 1.5–6.5)
Platelets: 243 10*3/uL (ref 145–400)

## 2012-07-11 LAB — LACTATE DEHYDROGENASE (CC13): LDH: 182 U/L (ref 125–220)

## 2012-07-11 NOTE — Telephone Encounter (Signed)
Pt came by and sent to [facility] radiology today for  PA & LAT chest xray

## 2012-07-11 NOTE — Progress Notes (Signed)
CC:   [name], MD  PROBLEM LIST: 1. 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     Estrogen receptor was 89%, progesterone receptor 10% and HER-2/neu     was negative.  The patient underwent a right modified radical     mastectomy on 01/27/2000 followed by adjuvant chemotherapy     consisting of Cytoxan, Adriamycin and  Taxol from 06/23/2000     through 11/2000.  She then received radiation to the right chest     wall through 01/2001 and then was on adjuvant Arimidex from 11/2000     through 12/2005.  The patient has remained disease free. 2. Hypertension. 3. Goiter. 4. Dyslipidemia. 5. Osteopenia. 6. Macular degeneration. 7. Possible TIA in early 08/2000. 8. Hemangioma of the right hepatic lobe noted 02/2000. 9. Right lower lobe lung nodule noted 10/2007.   MEDICATIONS:  Were reviewed and recorded.  SMOKING HISTORY:  The patient smoked less than a pack of cigarettes a day for about 8 years but has not smoked cigarettes in about 50 years.  HISTORY:  I saw Amy Hahn today for followup of her history of stage IIB poorly differentiated cancer of the right breast dating back to 01/2000, i.e. 76 years ago.  The patient remains disease free off of all treatment.  She was last seen by [place] on 06/25/2011 and on 06/03/2010. The patient's 74 year old mother passed away.  She has had a couple of other deaths in her family.  All in all, the patient seems to be doing well.  She was diagnosed with macular degeneration about a year ago. She is still able to drive.  She had some heart issues mostly related to bradycardia.  This has improved since labetalol was discontinued.  At one point the patient was told she might need a pacemaker.  Symptoms seem to have improved.  She is without any symptoms to suggest recurrent breast  cancer.  PHYSICAL EXAM:  Amy Hahn looks well.  Weight is 222 pounds, height 5 feet 1 inch, body surface area 2.08 m2.  Blood pressure today 178/94. Pulse 64 and regular, respirations regular and unlabored.  She is afebrile.  There is no scleral icterus.  Mouth and pharynx are benign. There is no peripheral adenopathy palpable.  Heart and lungs are normal. I did not hear a systolic ejection murmur.  Right breast is surgically absent with no evidence for chest wall recurrence.  Left breast is without evidence of cancer.  No suspicious findings.  No axillary adenopathy.  Abdomen is obese, nontender with no organomegaly or masses palpable.  Extremities:  No peripheral edema or clubbing.  No obvious lymphedema of the right arm.  Neurologic exam notable for some very subtle flattening of the left nasolabial fold.  LABORATORY DATA:  Today, white count 6.1, ANC 3.1, hemoglobin 13.1, hematocrit 39.2 and platelets 243,000.  Chemistries today notable for an albumin of 3.4.  Otherwise normal.  IMAGING STUDIES: 1. Chest CT scan without IV contrast from 05/13/2009 showed a 4 mm     well differentiated right lower lobe nodule which is felt to be     stable. 2. Chest x-ray, 2 view, from 06/03/2010 showed a 5 mm right lower lobe     nodule which is stable.  No other nodules were present. 3. Digital left screening mammogram  on 07/13/2011 was negative. 4. Thyroid ultrasound from 05/30/2012 showed thyromegaly.  There were     small left lobe and isthmic nodules.  Findings did not meet current     SRU consensus criteria for biopsy.  IMPRESSION AND PLAN:  Amy Hahn continues to do well with no signs of recurrent breast cancer, now 12-1/2 years from the time of diagnosis. Amy Hahn is due for another mammogram of her left breast.  It has been 2 years since her last chest x-ray, and in view of the lung nodule we will go ahead and get another chest x-ray, 2 views.  I told Amy Hahn that she really did not need to  come back and see [place] unless she wants to do that.  She will see [place] on an as-needed basis.  As stated, we will go ahead with a chest x-ray, 2 view, today and Amy Hahn will arrange for a mammogram of her left breast.    ______________________________ Samul Dada, M.D. DSM/MEDQ  D:  07/11/2012  T:  07/11/2012  Job:  [id number]

## 2012-07-11 NOTE — Progress Notes (Signed)
This office note has been dictated.  #[id number]

## 2012-07-12 ENCOUNTER — Encounter: Payer: Self-pay | Admitting: Cardiology

## 2012-07-12 ENCOUNTER — Ambulatory Visit (INDEPENDENT_AMBULATORY_CARE_PROVIDER_SITE_OTHER): Payer: Medicare Other | Admitting: Cardiology

## 2012-07-12 ENCOUNTER — Other Ambulatory Visit: Payer: Self-pay | Admitting: Oncology

## 2012-07-12 VITALS — BP 124/64 | HR 56 | Ht 61.0 in | Wt 219.1 lb

## 2012-07-12 DIAGNOSIS — I498 Other specified cardiac arrhythmias: Secondary | ICD-10-CM | POA: Diagnosis not present

## 2012-07-12 DIAGNOSIS — I6529 Occlusion and stenosis of unspecified carotid artery: Secondary | ICD-10-CM

## 2012-07-12 DIAGNOSIS — I10 Essential (primary) hypertension: Secondary | ICD-10-CM | POA: Diagnosis not present

## 2012-07-12 DIAGNOSIS — R001 Bradycardia, unspecified: Secondary | ICD-10-CM

## 2012-07-12 DIAGNOSIS — R002 Palpitations: Secondary | ICD-10-CM | POA: Diagnosis not present

## 2012-07-12 DIAGNOSIS — E032 Hypothyroidism due to medicaments and other exogenous substances: Secondary | ICD-10-CM

## 2012-07-12 DIAGNOSIS — R911 Solitary pulmonary nodule: Secondary | ICD-10-CM

## 2012-07-12 NOTE — Patient Instructions (Addendum)
Your physician wants you to follow-up in: MARCH 2014. You will receive a reminder letter in the mail two months in advance. If you don't receive a letter, please call our office to schedule the follow-up appointment.  Your physician recommends that you continue on your current medications as directed. Please refer to the Current Medication list given to you today.  

## 2012-07-12 NOTE — Progress Notes (Signed)
HPI:  The patient is in for follow up.  She is doing well.  Her BP are a little higher on her machine, but when checked at [facility], or checked in Dr. Selena Batten or our office, seems ok.  Her arm is large.  Dr. Selena Batten did review her Thyroid US with her, and she was placed on thyroid hormone.  No prior history of thyroid cancer.  The patient has a copy of her report.    Current Outpatient Prescriptions  Medication Sig Dispense Refill  . amLODipine (NORVASC) 10 MG tablet Take 1 tablet (10 mg total) by mouth daily.  90 tablet  3  . aspirin 81 MG tablet Take 81 mg by mouth daily.      . Azilsartan Medoxomil (EDARBI) 80 MG TABS Take 40 mg by mouth daily.      . cholecalciferol (VITAMIN D) 1000 UNITS tablet Take 5,000 Units by mouth daily.      . diazepam (VALIUM) 5 MG tablet Take 5 mg by mouth as directed.       . dorzolamide-timolol (COSOPT) 22.3-6.8 MG/ML ophthalmic solution Place 1 drop into both eyes as directed.       . furosemide (LASIX) 40 MG tablet Take 20 mg by mouth daily.       [name] HYDROcodone-acetaminophen (LORTAB) 7.5-500 MG per tablet Take 1 tablet by mouth.       . latanoprost (XALATAN) 0.005 % ophthalmic solution Place 1 drop into both eyes at bedtime.      [name] levothyroxine (SYNTHROID, LEVOTHROID) 25 MCG tablet Take 25 mcg by mouth daily.      . multivitamin-lutein (OCUVITE-LUTEIN) CAPS Take 1 capsule by mouth daily.      . NON FORMULARY CPAP machine       . omeprazole (PRILOSEC) 20 MG capsule Take 20 mg by mouth every other day.       . rosuvastatin (CRESTOR) 10 MG tablet Take 10 mg by mouth. Monday and Friday        Allergies  Allergen Reactions  . Belviq (Lorcaserin Hcl)   . Latex   . Morphine And Related     Past Medical History  Diagnosis Date  . Breast cancer   . Macular degeneration   . Glaucoma   . Sleep apnea   . Cataracts, bilateral     Past Surgical History  Procedure Date  . Cesarean section   . Cataract extraction, bilateral   . Total knee arthroplasty     x2      No family history on file.  History   Social History  . Marital Status: Married    Spouse Name: N/A    Number of Children: N/A  . Years of Education: N/A   Occupational History  . Not on file.   Social History Main Topics  . Smoking status: Former Games developer  . Smokeless tobacco: Not on file  . Alcohol Use: No  . Drug Use: No  . Sexually Active:    Other Topics Concern  . Not on file   Social History Narrative  . No narrative on file    ROS: Please see the HPI.  All other systems reviewed and negative.  PHYSICAL EXAM:  BP 124/64  Pulse 56  Ht 5\' 1"  (1.549 m)  Wt 219 lb 1.9 oz (99.392 kg)  BMI 41.40 kg/m2  General: Well developed, well nourished, in no acute distress. Head:  Normocephalic and atraumatic. Neck: no JVD Lungs: Clear to auscultation and percussion. Heart: Normal  S1 and S2.  No murmur, rubs or gallops.  Abdomen:  Normal bowel sounds; soft; non tender; no organomegaly Pulses: Pulses normal in all 4 extremities. Extremities: No clubbing or cyanosis. No edema. Neurologic: Alert and oriented x 3.  EKG:  SB with first degree.  PR .  Prob blocked PAC with pause.  No acute changes.  LVH.  Nonspecific T flattening.    ASSESSMENT AND PLAN:

## 2012-07-13 ENCOUNTER — Telehealth: Payer: Self-pay | Admitting: Medical Oncology

## 2012-07-13 ENCOUNTER — Telehealth: Payer: Self-pay | Admitting: Oncology

## 2012-07-13 NOTE — Telephone Encounter (Signed)
I called pt to let her know that Dr. Arline Asp is ordering a CT of chest. The chest x-ray show a lung nodule that has increased in size since her last x-ray and the radiologist recommends a CT to further evaluate.

## 2012-07-13 NOTE — Telephone Encounter (Signed)
s.w. pt and advised on 10.10.13 appt

## 2012-07-14 ENCOUNTER — Ambulatory Visit (HOSPITAL_COMMUNITY)
Admission: RE | Admit: 2012-07-14 | Discharge: 2012-07-14 | Disposition: A | Discharge status: Home / Self Care | Payer: Medicare Other | Source: Ambulatory Visit | Attending: Oncology | Admitting: Oncology

## 2012-07-14 ENCOUNTER — Other Ambulatory Visit: Payer: Self-pay | Admitting: Oncology

## 2012-07-14 DIAGNOSIS — R911 Solitary pulmonary nodule: Secondary | ICD-10-CM | POA: Diagnosis not present

## 2012-07-14 DIAGNOSIS — I517 Cardiomegaly: Secondary | ICD-10-CM | POA: Diagnosis not present

## 2012-07-14 DIAGNOSIS — Z9010 Acquired absence of unspecified breast and nipple: Secondary | ICD-10-CM | POA: Insufficient documentation

## 2012-07-14 DIAGNOSIS — Z853 Personal history of malignant neoplasm of breast: Secondary | ICD-10-CM | POA: Diagnosis not present

## 2012-07-18 ENCOUNTER — Telehealth: Payer: Self-pay | Admitting: Medical Oncology

## 2012-07-18 NOTE — Telephone Encounter (Signed)
Pt called asking if Dr. Arline Asp has gotten her CT results from 07/14/12. Per Dr. Arline Asp he would like to review the scan and then we will call her. She voiced understanding.

## 2012-07-19 ENCOUNTER — Telehealth: Payer: Self-pay | Admitting: Medical Oncology

## 2012-07-19 ENCOUNTER — Other Ambulatory Visit: Payer: Self-pay | Admitting: Oncology

## 2012-07-19 ENCOUNTER — Telehealth: Payer: Self-pay | Admitting: Oncology

## 2012-07-19 DIAGNOSIS — R911 Solitary pulmonary nodule: Secondary | ICD-10-CM

## 2012-07-19 NOTE — Telephone Encounter (Signed)
I called pt per Dr. Arline Asp to let her know that he reviewed her CT. I explained the right lower lobe lung nodule that was 5 mm 3 years ago had increased to 8 mm. Dr. Arline Asp and the radiologist has recommended she get a PET scan to better evaluate the nodule. I explained the schedulers will call her to set up the appt. She voiced understanding.

## 2012-07-19 NOTE — Telephone Encounter (Signed)
s/w the pt and she is aware of her pet scan appt on wed oct 23rd at 10:15am at [facility]

## 2012-07-25 ENCOUNTER — Encounter (HOSPITAL_COMMUNITY): Payer: Self-pay

## 2012-07-25 ENCOUNTER — Other Ambulatory Visit: Payer: Self-pay | Admitting: Oncology

## 2012-07-25 ENCOUNTER — Ambulatory Visit (HOSPITAL_COMMUNITY)
Admission: RE | Admit: 2012-07-25 | Discharge: 2012-07-25 | Disposition: A | Discharge status: Home / Self Care | Payer: Medicare Other | Source: Ambulatory Visit | Attending: Oncology | Admitting: Oncology

## 2012-07-25 DIAGNOSIS — Z853 Personal history of malignant neoplasm of breast: Secondary | ICD-10-CM | POA: Diagnosis not present

## 2012-07-25 DIAGNOSIS — C50919 Malignant neoplasm of unspecified site of unspecified female breast: Secondary | ICD-10-CM | POA: Diagnosis not present

## 2012-07-25 DIAGNOSIS — R911 Solitary pulmonary nodule: Secondary | ICD-10-CM | POA: Diagnosis not present

## 2012-07-25 DIAGNOSIS — R222 Localized swelling, mass and lump, trunk: Secondary | ICD-10-CM | POA: Insufficient documentation

## 2012-07-25 DIAGNOSIS — J984 Other disorders of lung: Secondary | ICD-10-CM | POA: Diagnosis not present

## 2012-07-25 HISTORY — DX: Solitary pulmonary nodule: R91.1

## 2012-07-25 LAB — GLUCOSE, CAPILLARY: Glucose-Capillary: 81 mg/dL (ref 70–99)

## 2012-07-25 MED ORDER — FLUDEOXYGLUCOSE F - 18 (FDG) INJECTION
19.9000 | Freq: Once | INTRAVENOUS | Status: AC | PRN
  Administered 2012-07-25: 19.9 via INTRAVENOUS

## 2012-07-26 ENCOUNTER — Ambulatory Visit
Admission: RE | Admit: 2012-07-26 | Discharge: 2012-07-26 | Disposition: A | Discharge status: Home / Self Care | Payer: Medicare Other | Source: Ambulatory Visit | Attending: Internal Medicine | Admitting: Internal Medicine

## 2012-07-26 DIAGNOSIS — Z1231 Encounter for screening mammogram for malignant neoplasm of breast: Secondary | ICD-10-CM | POA: Diagnosis not present

## 2012-07-26 DIAGNOSIS — H04129 Dry eye syndrome of unspecified lacrimal gland: Secondary | ICD-10-CM | POA: Diagnosis not present

## 2012-07-26 DIAGNOSIS — H4011X0 Primary open-angle glaucoma, stage unspecified: Secondary | ICD-10-CM | POA: Diagnosis not present

## 2012-07-26 DIAGNOSIS — Z961 Presence of intraocular lens: Secondary | ICD-10-CM | POA: Diagnosis not present

## 2012-07-26 DIAGNOSIS — H3530 Unspecified macular degeneration: Secondary | ICD-10-CM | POA: Diagnosis not present

## 2012-07-27 ENCOUNTER — Ambulatory Visit (HOSPITAL_COMMUNITY): Payer: Medicare Other

## 2012-07-27 ENCOUNTER — Telehealth: Payer: Self-pay | Admitting: Medical Oncology

## 2012-07-27 NOTE — Telephone Encounter (Signed)
I called pt regarding PET scan results. Per Dr. Arline Asp he would like to go review with the radiologist and then we will call her with results. She voiced understanding.

## 2012-07-28 ENCOUNTER — Encounter: Payer: Self-pay | Admitting: Oncology

## 2012-07-28 ENCOUNTER — Telehealth: Payer: Self-pay | Admitting: Medical Oncology

## 2012-07-28 ENCOUNTER — Other Ambulatory Visit: Payer: Self-pay | Admitting: Medical Oncology

## 2012-07-28 NOTE — Progress Notes (Signed)
The PET scan from 07/25/2012 was reviewed and compared with prior CT scans. It will be recalled that the CT scan from 05/13/2009 showed a 4-5 mm nodule in the right lower lobe which was compared with the CT scan of 05/08/2008. The nodule was said to be stable. A PET scan showed no abnormal hypermetabolic activity within the nodule or elsewhere.  A CT scan of the chest from 07/14/2012 showed a mild increase in the right lower lobe pulmonary nodule up to 8 mm.  This lesion is small and near the diaphragm and does not look amenable to biopsy at this time. We will have the case reviewed in the lung cancer tumor conference. Options will be for continued observation versus surgical excision. If we elect to follow the patient, it was recommended that another CT scan be obtained in 3-6 months.

## 2012-07-28 NOTE — Telephone Encounter (Signed)
Left message for pt to call me regarding her PET results

## 2012-07-28 NOTE — Telephone Encounter (Signed)
Per Dr. Arline Asp I called pt to let her know that he reviewed her PET. The nodule did not light up but he is concerned that it could be a non aggressive cancer. He would like for her to come in and they can discuss in detail. She would like that and appt given for 07/29/12 at 400pm

## 2012-07-29 ENCOUNTER — Ambulatory Visit (HOSPITAL_BASED_OUTPATIENT_CLINIC_OR_DEPARTMENT_OTHER): Payer: Medicare Other | Admitting: Oncology

## 2012-07-29 ENCOUNTER — Encounter: Payer: Self-pay | Admitting: Oncology

## 2012-07-29 VITALS — BP 181/85 | HR 66 | Temp 97.2°F | Resp 20 | Ht 61.0 in | Wt 221.7 lb

## 2012-07-29 DIAGNOSIS — Z853 Personal history of malignant neoplasm of breast: Secondary | ICD-10-CM | POA: Diagnosis not present

## 2012-07-29 DIAGNOSIS — R911 Solitary pulmonary nodule: Secondary | ICD-10-CM

## 2012-07-29 NOTE — Progress Notes (Signed)
CC:   [name], MD Salvatore Decent Dorris Fetch, M.D.   PROBLEM LIST:  1. Poorly differentiated grade 3 carcinoma of the right breast with  4/22 positive lymph nodes, extracapsular spread and positive  hormone receptors, stage IIB, T2 N1 dating back to April 2001.  Estrogen receptor was 89%, progesterone receptor 10% and HER-2/neu  was negative. The patient underwent a right modified radical  mastectomy on 01/27/2000 followed by adjuvant chemotherapy  consisting of Cytoxan, Adriamycin and Taxol from 06/23/2000  through 11/2000. She then received radiation to the right chest  wall through 01/2001 and then was on adjuvant Arimidex from 11/2000  through 12/2005. The patient has remained disease free.  2. Hypertension.  3. Goiter.  4. Dyslipidemia.  5. Osteopenia.  6. Macular degeneration.  7. Possible TIA in early 08/2000.  8. Hemangioma of the right hepatic lobe noted 02/2000.  9. Right lower lobe lung nodule noted 10/20/2007. This lesion was stable measuring 5 mm, up to a chest x-ray carried out on 06/03/2010.  Recent imaging studies include a chest x-ray carried out on 07/11/2012, a CT scan of the chest on 07/14/2012, and a PET scan from 07/25/2012.  These imaging studies show that this lesion has increased, now measures 8 mm.  PET scan showed no hypermetabolic activity associated with the right lower lobe lung nodule.  The patient will be referred to Dr. Charlett Lango for consideration of surgical resection.  MEDICATIONS: 1. Norvasc 10 mg daily. 2. Aspirin 81 mg daily. 3. Edarbi 40 mg daily. 4. Cholecalciferol 5000 units daily. 5. Valium 5 mg as needed. 6. Cosopt ophthalmic solution 1 drop into both eyes as directed. 7. Lasix 20 mg daily. 8. Lortab 7.5/500 one daily. 9. Xalatan 0.005% ophthalmic solution 1 drop in both eyes at bedtime. 10.Levothyroxine. 11.Multivitamin 1 daily. 12.Prilosec 20 mg every other day. 13.Crestor 10 mg Monday and Friday.  SMOKING HISTORY:  The patient smoked less than a pack of cigarettes a  day for about 8 years but has not smoked cigarettes in about 50 years.   HISTORY:  Amy Hahn was seen today for a special visit.  She was accompanied by her husband, Everlean Alstrom.  It will be recalled that she was last seen by [place] on 07/11/2012 for followup of her history of cancer of the right breast dating back to April 2001.  The patient has remained disease free.  On her last visit on 07/11/2012, we obtained a chest x- ray.  That chest x-ray on 07/11/2012 showed that the right lower lobe nodule now measured about 8 mm.  Previously on a chest x-ray from 06/03/2010, the chest x-ray measured 5 mm.  CT scan was recommended and carried out on 07/14/2012.  Chest x-ray confirmed a mild increase in the size of this right lower lobe nodule to 8 mm, compared with the CT scan from 05/13/2009.  A PET scan was carried out on 07/25/2012, which showed no hypermetabolic activity in this nodule or any abnormal __________ anywhere else.  The nodule measured 9 mm on image #90.  Because of the fact that this nodule seems to have increased recently, the patient was asked to come in so we could discuss the situation.  Her condition remains unchanged.  She is not symptomatic regarding this nodule as one would expect.  PHYSICAL EXAMINATION:  There is little change from the exam of 07/11/2012.  Weight today is 221.7 pounds, height 5 feet 1 inch.  Body surface area 2.08 sq m.  Blood pressure today  181/85, as compared with 124/64 on 07/12/2012 and 178/94 on 07/11/2012.  Other vital signs are normal.  The rest of the physical exam is unchanged.  LABORATORY DATA:  None was carried out today.   IMAGING STUDIES:  1. Chest CT scan without IV contrast from 05/13/2009 showed a 4 mm  well differentiated right lower lobe nodule which is felt to be  stable.  2. Chest x-ray, 2 view, from 06/03/2010 showed a 5 mm right lower lobe  nodule which is stable. No other  nodules were present.  3. Digital left screening mammogram on 07/13/2011 was negative.  4. Thyroid ultrasound from 05/30/2012 showed thyromegaly. There were  small left lobe and isthmic nodules. Findings did not meet current  SRU consensus criteria for biopsy. 5. Chest x-ray, 2 view, on 07/11/2012 showed that the right lower lobe     pulmonary nodule measured 8 mm, as compared with 5 mm on the prior     chest x-ray from 06/03/2010 and the chest CT scan from 05/13/2009. 6. CT of the chest without IV contrast on 07/14/2012 showed that the     right lower lobe pulmonary nodule measured 8 mm, was felt to be     slightly increased compared to the prior CT scan of 3 years ago.     In the differential is a carcinoid tumor.  The possibility of a     lung metastasis from breast cancer was also raised. 7. PET scan from 07/25/2012 showed no hypermetabolic activity     associated with a right lower lobe pulmonary nodule, which measured     9 mm on image #90.  There was diffuse metabolic activity within the     thyroid gland, most consistent with thyroiditis.  There were no     hypermetabolic mediastinal lymph nodes or any other suspicious     lesions. 8. Mammogram of the left breast on 07/26/2012 was negative.   IMPRESSION/PLAN:  I met with the patient and her husband, Everlean Alstrom, for about 40 minutes today.  I went over the scans, looking at the images as well as the reports.  There clearly has been a slight increase in the size of this right lower lobe pulmonary lesion from 5 to 8 mm.  That increase seems to have occurred sometime during the past 2 years.  We have a chest x-ray from 06/03/2010 that showed that the lesion measured 5 mm at that time, and now it measures about 8 mm.  This lesion was first noted on October 20, 2007 and on a CT scan and appeared to be stable for approximately 3 years.  Again, we had a rather lengthy visit.  I went over the scans very carefully and the possibilities.   The patient is very nervous about this lesion; therefore, I am going to refer her to Dr. Charlett Lango for consideration of resection.  I have already reviewed the images with an interventional radiologist.  The biopsy is not felt to be feasible at this time due to the small size of the lesion and its proximity to the right hemidiaphragm.  Certainly this lesion could be a small low-grade neuroendocrine tumor such as a carcinoid tumor.  I believe the patient's case will also be presented at lung cancer conference.  I have asked Ms. Schmoll to return in approximately 3 months.  I have not ordered any labs for that visit.    ______________________________ Samul Dada, M.D. DSM/MEDQ  D:  07/29/2012  T:  07/29/2012  Job:  [id number]

## 2012-07-29 NOTE — Patient Instructions (Signed)
We are setting you up for an appointment with Dr. Dorris Fetch, a lung surgeon.

## 2012-07-29 NOTE — Progress Notes (Signed)
This office note has been dictated.  #[id number]

## 2012-07-30 NOTE — Assessment & Plan Note (Signed)
This seems to be improved.  Will continue to monitor her and see her back in follow up.

## 2012-07-30 NOTE — Assessment & Plan Note (Signed)
Follow up is due in 05/2013

## 2012-07-30 NOTE — Assessment & Plan Note (Signed)
Followed by Dr. Selena Batten.  He is also aware of her thyroid findings, and discussed this with her at her last office visit.

## 2012-07-30 NOTE — Assessment & Plan Note (Signed)
Controlled despite cutting back on beta blockers.

## 2012-08-01 ENCOUNTER — Telehealth: Payer: Self-pay | Admitting: Oncology

## 2012-08-01 NOTE — Telephone Encounter (Signed)
called pt and she is aware of her appt with [facility]/dr hendrickson     aom

## 2012-08-09 ENCOUNTER — Encounter: Payer: Self-pay | Admitting: Thoracic Surgery (Cardiothoracic Vascular Surgery)

## 2012-08-09 ENCOUNTER — Institutional Professional Consult (permissible substitution) (INDEPENDENT_AMBULATORY_CARE_PROVIDER_SITE_OTHER): Payer: Medicare Other | Admitting: Thoracic Surgery (Cardiothoracic Vascular Surgery)

## 2012-08-09 VITALS — BP 143/80 | HR 72 | Resp 16 | Ht 62.0 in | Wt 219.0 lb

## 2012-08-09 DIAGNOSIS — Z853 Personal history of malignant neoplasm of breast: Secondary | ICD-10-CM | POA: Diagnosis not present

## 2012-08-09 DIAGNOSIS — R911 Solitary pulmonary nodule: Secondary | ICD-10-CM

## 2012-08-09 DIAGNOSIS — J984 Other disorders of lung: Secondary | ICD-10-CM

## 2012-08-09 NOTE — Progress Notes (Signed)
PCP is Pearson Grippe, MD Referring Provider is Murinson, Gerarda Fraction, MD  Chief Complaint  Patient presents with  . Lung Lesion    eval and treat with negative PET     HPI: 33 woman presents with a chief complaint of a lung nodule.  Amy Hahn is a 76 year old woman with a history of breast cancer in 2001. She had a CT of the chest in 2010 which showed a 4.8 mm nodule in the right lower lobe. She recently had a chest x-ray which suggested the nodule was 8 mm in diameter. A CT of the chest was done and didn't show a well-circumscribed round nodule in the midportion of the right lower lobe approximately 8 mm in diameter. A PET CT showed no hypermetabolism in the nodule.  Past Medical History  Diagnosis Date  . Breast cancer   . Macular degeneration   . Glaucoma   . Sleep apnea   . Cataracts, bilateral   . Nodule of right lung   Hypertension Hyperlipidemia Obesity  Past Surgical History  Procedure Date  . Cesarean section   . Cataract extraction, bilateral   . Total knee arthroplasty     x2    No family history on file. Both parents deceased-father had cancer Sister alive cancer Brother 2 heart attacks  Social History History  Substance Use Topics  . Smoking status: Former Smoker -- 1.0 packs/day    Types: Cigarettes    Start date: 08/09/1960  . Smokeless tobacco: Never Used  . Alcohol Use: No    Current Outpatient Prescriptions  Medication Sig Dispense Refill  . amLODipine (NORVASC) 10 MG tablet Take 1 tablet (10 mg total) by mouth daily.  90 tablet  3  . aspirin 81 MG tablet Take 81 mg by mouth daily.      . Azilsartan Medoxomil (EDARBI) 80 MG TABS Take 40 mg by mouth daily.      . cholecalciferol (VITAMIN D) 1000 UNITS tablet Take 5,000 Units by mouth daily.      . diazepam (VALIUM) 5 MG tablet Take 5 mg by mouth as directed.       . dorzolamide-timolol (COSOPT) 22.3-6.8 MG/ML ophthalmic solution Place 1 drop into both eyes as directed.       . furosemide (LASIX) 40  MG tablet Take 20 mg by mouth daily.       [name] HYDROcodone-acetaminophen (LORTAB) 7.5-500 MG per tablet Take 1 tablet by mouth.       . latanoprost (XALATAN) 0.005 % ophthalmic solution Place 1 drop into both eyes at bedtime.      [name] levothyroxine (SYNTHROID, LEVOTHROID) 25 MCG tablet Take 25 mcg by mouth daily.      . multivitamin-lutein (OCUVITE-LUTEIN) CAPS Take 1 capsule by mouth daily.      . NON FORMULARY CPAP machine       . omeprazole (PRILOSEC) 20 MG capsule Take 20 mg by mouth every other day.       . rosuvastatin (CRESTOR) 10 MG tablet Take 10 mg by mouth. Monday and Friday      . NITROSTAT 0.4 MG SL tablet         Allergies  Allergen Reactions  . Belviq (Lorcaserin Hcl)   . Morphine And Related Other (See Comments)    "MAKES ME CRAZY"  . Latex Rash    " IF ON ME FOR OVER 24 HOURS"    Review of Systems  Constitutional: Positive for unexpected weight change (weight gain 5'2'' 219 lbs).  Eyes: Positive for visual disturbance (glaucoma, macular degeneration).  Respiratory: Positive for apnea (uses CPAP at night).   Gastrointestinal:       Heartburn, hiatal hernia  Genitourinary: Negative.   Musculoskeletal: Positive for gait problem (bilateral knee repalcements).  All other systems reviewed and are negative.    BP 143/80  Pulse 72  Resp 16  Ht 5\' 2"  (1.575 m)  Wt 219 lb (99.338 kg)  BMI 40.06 kg/m2  SpO2 95% Physical Exam  Vitals reviewed. Constitutional: She is oriented to person, place, and time. No distress.       Morbidly obese  HENT:  Head: Normocephalic and atraumatic.  Eyes:       glasses  Neck: Neck supple. No thyromegaly present.  Cardiovascular: Normal rate, regular rhythm and normal heart sounds.   No murmur heard. Pulmonary/Chest: Effort normal and breath sounds normal. She has no wheezes. She has no rales.  Abdominal: Soft. There is no tenderness.  Musculoskeletal: She exhibits no edema.  Lymphadenopathy:    She has no cervical adenopathy.    Neurological: She is alert and oriented to person, place, and time. No cranial nerve deficit.  Skin: Skin is warm.     Diagnostic Tests: CT of chest 07/14/2012 Clinical Data: Right lung nodule seen on chest radiograph.  Personal history breast carcinoma.  CT CHEST WITHOUT CONTRAST  Technique: Multidetector CT imaging of the chest was performed  following the standard protocol without IV contrast.  Comparison: 05/13/2009  Findings: Previous right mastectomy again noted as well as axillary  lymph node dissection. No evidence of axillary lymphadenopathy.  Radiation changes in the anterior right upper lung are stable. An 8  mm noncalcified pulmonary nodule is seen in the anterior right  lower lobe which is increased in size from 5 mm on previous study.  Differential diagnosis includes pulmonary metastasis and carcinoid  tumor. No other suspicious pulmonary nodules or masses are  identified. No evidence of pulmonary infiltrates.  No evidence of pleural or pericardial effusion. Mild cardiomegaly  stable. No evidence of hilar or mediastinal lymphadenopathy.  Visualized portions of adrenal glands are normal appearance. Small  right hepatic lobe cysts again noted. No suspicious bone lesions  are identified.  IMPRESSION:  1. Mild increase in size of 8 mm right lower lobe pulmonary nodule  compared to previous CT 3 years ago. Differential diagnosis  includes pulmonary metastasis and carcinoid tumor. Consultation  with the [facility] [phone] should be considered.  2. No other sites of neoplasm identified within the thorax.  PET CT of chest 07/25/12  *RADIOLOGY REPORT*  Clinical Data: Initial treatment strategy for solitary pulmonary  nodule. Right breast cancer  NUCLEAR MEDICINE PET SKULL BASE TO THIGH  Fasting Blood Glucose: 81  Technique: 19.9 mCi F-18 FDG was injected intravenously. CT data  was obtained and used for attenuation  correction and anatomic  localization only. (This was not acquired as a diagnostic CT  examination.) Additional exam technical data entered on  technologist worksheet.  Comparison: CT 07/14/2012  Findings:  Neck: No hypermetabolic lymph nodes in the neck. There is diffuse  metabolic activity within the thyroid gland consistent with  thyroiditis.  Chest: There is no clear hypermetabolic active associated with the  right lobe pulmonary nodule. Nodule measures 9 mm (image #90) and  is near the diaphragm. The size and position of this nodule does  limit accurate characterization with PET imaging. No  hypermetabolic mediastinal lymph nodes. There is atelectasis in  the right middle lobe.  Abdomen/Pelvis: No abnormal hypermetabolic activity within the  liver, pancreas, adrenal glands, or spleen. No hypermetabolic  lymph nodes in the abdomen or pelvis.  Skeleton: No focal hypermetabolic activity to suggest skeletal  metastasis.  IMPRESSION:  1. No hypermetabolic active associated right lower lobe pulmonary  nodule. Recommend follow-up CT thorax without contrast in 3 to 6  months to evaluate stability.  2. Diffuse metabolic activity within the thyroid gland is most  consistent with thyroiditis.   Impression: 76 year old woman with a history of breast cancer with a right lower lobe nodule. This nodule was present, although smaller, back in 2010. It measured 4.8 mm at that time. In the interim and is now run to approximately 8 mm. It is smooth, round, and well circumscribed. It is not hypermetabolic on PET. All these factors suggest that this is most likely a benign tumor, likely a carcinoid.  This lesion is very central in the lower lobe and more than likely would require a lobectomy to excise it. It is not amenable to wedge resection. It is inaccessible for CT-guided biopsy. And although a bronchoscopic biopsy is possible, there would be a very high rate of negative results, which would not  necessarily be reliable.  I had a long discussion with Amy Hahn and her family regarding this lesion. Given that it likely would require an anatomic resection to remove the nodule, I don't think the risk of the procedure is justified. The only scenario I could envision in which that would be the case is if this lesion has only recently begun to grow rapidly, and we just happened to have randomly catched it at that very time. It is much more likely that this nodule has grown very slowly over the past 3 years and will continue to do so.  My recommendation to her was that we repeat a CT in 6 months. If there is any sign of accelerating growth, then we may need to consider surgical resection at that time.  Plan: Return in 6 months with CT of chest to followup right lower lobe nodule.

## 2012-09-19 DIAGNOSIS — M810 Age-related osteoporosis without current pathological fracture: Secondary | ICD-10-CM | POA: Diagnosis not present

## 2012-09-19 DIAGNOSIS — E039 Hypothyroidism, unspecified: Secondary | ICD-10-CM | POA: Diagnosis not present

## 2012-09-19 DIAGNOSIS — E559 Vitamin D deficiency, unspecified: Secondary | ICD-10-CM | POA: Diagnosis not present

## 2012-09-22 DIAGNOSIS — R911 Solitary pulmonary nodule: Secondary | ICD-10-CM | POA: Diagnosis not present

## 2012-09-22 DIAGNOSIS — E785 Hyperlipidemia, unspecified: Secondary | ICD-10-CM | POA: Diagnosis not present

## 2012-09-22 DIAGNOSIS — E039 Hypothyroidism, unspecified: Secondary | ICD-10-CM | POA: Diagnosis not present

## 2012-09-22 DIAGNOSIS — I10 Essential (primary) hypertension: Secondary | ICD-10-CM | POA: Diagnosis not present

## 2012-10-28 ENCOUNTER — Telehealth: Payer: Self-pay | Admitting: Oncology

## 2012-10-28 ENCOUNTER — Ambulatory Visit (HOSPITAL_BASED_OUTPATIENT_CLINIC_OR_DEPARTMENT_OTHER): Payer: Medicare Other | Admitting: Family

## 2012-10-28 ENCOUNTER — Encounter: Payer: Self-pay | Admitting: Family

## 2012-10-28 VITALS — BP 162/86 | HR 68 | Temp 97.1°F | Resp 20 | Ht 62.0 in | Wt 221.0 lb

## 2012-10-28 DIAGNOSIS — Z853 Personal history of malignant neoplasm of breast: Secondary | ICD-10-CM | POA: Diagnosis not present

## 2012-10-28 DIAGNOSIS — I10 Essential (primary) hypertension: Secondary | ICD-10-CM | POA: Diagnosis not present

## 2012-10-28 DIAGNOSIS — M949 Disorder of cartilage, unspecified: Secondary | ICD-10-CM | POA: Diagnosis not present

## 2012-10-28 DIAGNOSIS — C50911 Malignant neoplasm of unspecified site of right female breast: Secondary | ICD-10-CM

## 2012-10-28 DIAGNOSIS — M899 Disorder of bone, unspecified: Secondary | ICD-10-CM

## 2012-10-28 DIAGNOSIS — R911 Solitary pulmonary nodule: Secondary | ICD-10-CM

## 2012-10-28 NOTE — Progress Notes (Signed)
Patient ID: Amy Hahn, female   DOB: 1936/02/10, 77 y.o.   MRN: [id number] CSN: [id number]  CC: [name], MD  Salvatore Decent Dorris Fetch, MD  Problem List: Amy Hahn is a 77 y.o. Caucasian female with a problem list consisting of:  1. Poorly differentiated grade 3 carcinoma of the right breast with 4/22 positive lymph nodes, extracapsular spread and positive hormone receptors, stage IIB, T2 N1 dating back to April 2001.  Estrogen receptor was 89%, progesterone receptor 10% and HER-2/neu was negative. The patient underwent a right modified radical mastectomy on 01/27/2000 followed by adjuvant chemotherapy consisting of Cytoxan, Adriamycin and Taxol from 06/23/2000 through 11/2000. She then received radiation to the right chest wall through 01/2001 and then was on adjuvant Arimidex from 11/2000 through 12/2005. The patient has remained disease free.  2. Hypertension 3. Goiter 4. Dyslipidemia 5. Osteopenia 6. Macular degeneration 7. Possible TIA in early 08/2000.  8. Hemangioma of the right hepatic lobe noted 02/2000.  9. Right lower lobe lung nodule noted 10/20/2007. This lesion was stable measuring 5 mm, up to a chest x-ray carried out on 06/03/2010. Recent imaging studies include a chest x-ray carried out on 07/11/2012, a CT scan of the chest on 07/14/2012, and a  PET scan from 07/25/2012. These imaging studies show that this lesion has increased, now measures 8 mm. PET scan showed no hypermetabolic activity associated with the right lower lobe lung nodule. The patient was seen by Dr. Charlett Lango on 08/09/2012.     Dr. Arline Asp and I saw [name] today for a follow-up visit of her history of cancer to the right breast dating back to April 2001 and mildly increased right lower lobe nodule to 8 mm. The patient has remained disease free and her condition remains relatively unchanged.  She was last seen by [place] on 07/29/2012.  Since her last office visit, the patient state that she feels  great and does not have any complaints today.   She is not symptomatic and denies any symptomatology today including SOB, pain, chest pain, fever, chills, N/V/D or constipation, unusual bleeding and any breast changes.   Amy Hahn was seen by Dr. Dorris Fetch, Thoracic Surgeon on 08/09/2012.  She states she is scheduled to see Dr. Dorris Fetch again in 02/2013 and have a repeat CT of the chest scan at that time.    Past Medical History: Past Medical History  Diagnosis Date  . Breast cancer   . Macular degeneration   . Glaucoma   . Sleep apnea   . Cataracts, bilateral   . Nodule of right lung   . Hypertension   . Hyperlipidemia   . Obesity     Surgical History: Past Surgical History  Procedure Date  . Cesarean section   . Cataract extraction, bilateral   . Total knee arthroplasty     x2    Current Medications: Current Outpatient Prescriptions  Medication Sig Dispense Refill  . amLODipine (NORVASC) 10 MG tablet Take 1 tablet (10 mg total) by mouth daily.  90 tablet  3  . aspirin 81 MG tablet Take 81 mg by mouth daily.      . Azilsartan Medoxomil (EDARBI) 80 MG TABS Take 40 mg by mouth daily.      . cholecalciferol (VITAMIN D) 1000 UNITS tablet Take 5,000 Units by mouth daily.      . diazepam (VALIUM) 5 MG tablet Take 5 mg by mouth as directed.       . dorzolamide-timolol (  COSOPT) 22.3-6.8 MG/ML ophthalmic solution Place 1 drop into both eyes as directed.       . furosemide (LASIX) 40 MG tablet Take 20 mg by mouth daily.       [name] HYDROcodone-acetaminophen (LORTAB) 7.5-500 MG per tablet Take 1 tablet by mouth.       . latanoprost (XALATAN) 0.005 % ophthalmic solution Place 1 drop into both eyes at bedtime.      [name] levothyroxine (SYNTHROID, LEVOTHROID) 25 MCG tablet Take 25 mcg by mouth daily.      . multivitamin-lutein (OCUVITE-LUTEIN) CAPS Take 1 capsule by mouth daily.      [name] NITROSTAT 0.4 MG SL tablet       . NON FORMULARY CPAP machine       . omeprazole (PRILOSEC) 20 MG capsule  Take 20 mg by mouth every other day.       . rosuvastatin (CRESTOR) 10 MG tablet Take 10 mg by mouth. Monday and Friday        Allergies: Allergies  Allergen Reactions  . Belviq (Lorcaserin Hcl)   . Morphine And Related Other (See Comments)    "MAKES ME CRAZY"  . Latex Rash    " IF ON ME FOR OVER 24 HOURS"    Family History: Family History  Problem Relation Age of Onset  . Diabetes Mother   . Macular degeneration Mother   . Heart attack Father   . Cancer Father     Colon and Prostate Cancer  . Diabetes Sister   . Diabetes Brother   . Heart attack Brother   . Diabetes Brother     Social History: History  Substance Use Topics  . Smoking status: Former Smoker -- 1.0 packs/day    Types: Cigarettes    Start date: 08/09/1960  . Smokeless tobacco: Never Used  . Alcohol Use: No    Review of Systems: 10 Point review of systems was completed and is negative except as noted above.   Physical Exam:   Blood pressure 162/86, pulse 68, temperature 97.1 F (36.2 C), temperature source Oral, resp. rate 20, height 5\' 2"  (1.575 m), weight 221 lb (100.245 kg).  O2 sats 97% on RA.  General appearance: Alert, cooperative, well nourished, obese, no apparent distress Head: Normocephalic, without obvious abnormality, atraumatic Eyes: Conjunctivae/corneas clear, PERRLA, EOMI Nose: Nares, septum and mucosa are normal, no drainage or sinus tenderness Neck: No adenopathy, supple, symmetrical, trachea midline, thyroid slightly enlarged, no tenderness Resp: Clear to auscultation bilaterally, diminished bibasilar breath sounds Cardio: Regular rate and rhythm, S1, S2 normal, 1/6 murmur, no click, rub or gallop Breasts: Right breast is surgically absent with no evidence for chest wall recurrence. Left breast is without evidence of cancer. No suspicious findings. No axillary adenopathy. GI: Soft, distended, non-tender, hypoactive bowel sounds, no organomegaly Extremities: Extremities normal,  atraumatic, no cyanosis or edema Lymph nodes: Cervical, supraclavicular, and axillary nodes normal Neurologic: Grossly normal   Laboratory Data: No results found for this or any previous visit (from the past 48 hour(s)).   Imaging Studies: 1. Chest CT scan without IV contrast from 05/13/2009 showed a 4 mm well differentiated right lower lobe nodule which is felt to be  stable.  2. Chest x-ray, 2 view, from 06/03/2010 showed a 5 mm right lower lobe nodule which is stable. No other nodules were present.  3. Digital left screening mammogram on 07/13/2011 was negative.  4. Thyroid ultrasound from 05/30/2012 showed thyromegaly. There were small left lobe and isthmic nodules. Findings did  not meet current SRU consensus criteria for biopsy.  5. Chest x-ray, 2 view, on 07/11/2012 showed that the right lower lobe pulmonary nodule measured 8 mm, as compared with 5 mm on the prior chest x-ray from 06/03/2010 and the chest CT scan from 05/13/2009.  6. CT of the chest without IV contrast on 07/14/2012 showed that the right lower lobe pulmonary nodule measured 8 mm, was felt to be  slightly increased compared to the prior CT scan of 3 years ago. In the differential is a carcinoid tumor. The possibility of a lung metastasis from breast cancer was also raised.  7. PET scan from 07/25/2012 showed no hypermetabolic activity associated with a right lower lobe pulmonary nodule, which measured  9 mm on image #90. There was diffuse metabolic activity within the thyroid gland, most consistent with thyroiditis. There were no  hypermetabolic mediastinal lymph nodes or any other suspicious lesions.  8. Mammogram of the left breast on 07/26/2012 was negative.   Impression/Plan: Mrs. Feld continues to do well with no signs of recurrent breast cancer, now almost 13 years from the time of diagnosis. She has not experienced any symptoms related to the lung nodule.  Mrs. Meetze is scheduled to have a follow up visit with Dr.  Dorris Fetch in 02/2013 with a repeat CT of the chest.  We plan to see Mrs. Siller again in approximately 6 months on 05/08/2013 at which time we will check a CBC, CMP and LDH.  Mrs. Karges is encouraged to contact us in the interim if she has any questions or concerns.   Larina Bras, NP-C 10/28/2012, 6:33 PM

## 2012-10-28 NOTE — Telephone Encounter (Signed)
appt made and printed for pt aom °

## 2012-10-28 NOTE — Patient Instructions (Signed)
Please contact us at [phone] if you have any questions or concerns. 

## 2012-11-29 DIAGNOSIS — H1045 Other chronic allergic conjunctivitis: Secondary | ICD-10-CM | POA: Diagnosis not present

## 2012-11-29 DIAGNOSIS — H4011X0 Primary open-angle glaucoma, stage unspecified: Secondary | ICD-10-CM | POA: Diagnosis not present

## 2012-11-29 DIAGNOSIS — H04129 Dry eye syndrome of unspecified lacrimal gland: Secondary | ICD-10-CM | POA: Diagnosis not present

## 2012-11-29 DIAGNOSIS — Z961 Presence of intraocular lens: Secondary | ICD-10-CM | POA: Diagnosis not present

## 2012-12-01 DIAGNOSIS — M25579 Pain in unspecified ankle and joints of unspecified foot: Secondary | ICD-10-CM | POA: Diagnosis not present

## 2012-12-05 ENCOUNTER — Ambulatory Visit: Payer: Medicare Other | Admitting: Cardiology

## 2012-12-15 DIAGNOSIS — M25579 Pain in unspecified ankle and joints of unspecified foot: Secondary | ICD-10-CM | POA: Diagnosis not present

## 2012-12-15 DIAGNOSIS — S8263XA Displaced fracture of lateral malleolus of unspecified fibula, initial encounter for closed fracture: Secondary | ICD-10-CM | POA: Diagnosis not present

## 2012-12-26 ENCOUNTER — Ambulatory Visit (INDEPENDENT_AMBULATORY_CARE_PROVIDER_SITE_OTHER): Payer: Medicare Other | Admitting: Cardiology

## 2012-12-26 ENCOUNTER — Encounter: Payer: Self-pay | Admitting: Cardiology

## 2012-12-26 VITALS — BP 154/94 | HR 72 | Ht 61.5 in | Wt 219.0 lb

## 2012-12-26 DIAGNOSIS — R001 Bradycardia, unspecified: Secondary | ICD-10-CM

## 2012-12-26 DIAGNOSIS — I658 Occlusion and stenosis of other precerebral arteries: Secondary | ICD-10-CM

## 2012-12-26 DIAGNOSIS — I10 Essential (primary) hypertension: Secondary | ICD-10-CM

## 2012-12-26 DIAGNOSIS — I498 Other specified cardiac arrhythmias: Secondary | ICD-10-CM

## 2012-12-26 DIAGNOSIS — I6523 Occlusion and stenosis of bilateral carotid arteries: Secondary | ICD-10-CM

## 2012-12-26 DIAGNOSIS — R911 Solitary pulmonary nodule: Secondary | ICD-10-CM | POA: Diagnosis not present

## 2012-12-26 DIAGNOSIS — E032 Hypothyroidism due to medicaments and other exogenous substances: Secondary | ICD-10-CM

## 2012-12-26 NOTE — Progress Notes (Signed)
HPI:  She is in for followup today. She is doing really quite well. She's not had much the way of palpitations. She does unfortunately break her ankle. She is in a brace at this point in time. She's also following up with her primary care physician, and she is been seen for continued followup in the [facility] office for a lung nodule that Dr. Arline Asp has been watching from the standpoint of her lung cancer.  No current symptoms.    Current Outpatient Prescriptions  Medication Sig Dispense Refill  . amLODipine (NORVASC) 10 MG tablet Take 1 tablet (10 mg total) by mouth daily.  90 tablet  3  . aspirin 81 MG tablet Take 81 mg by mouth daily.      . Azilsartan Medoxomil (EDARBI) 80 MG TABS Take 40 mg by mouth daily.      . cholecalciferol (VITAMIN D) 1000 UNITS tablet Take 5,000 Units by mouth daily.      . diazepam (VALIUM) 5 MG tablet Take 5 mg by mouth as directed.       . dorzolamide-timolol (COSOPT) 22.3-6.8 MG/ML ophthalmic solution Place 1 drop into both eyes as directed.       . furosemide (LASIX) 40 MG tablet Take 20 mg by mouth daily.       [name] HYDROcodone-acetaminophen (LORTAB) 7.5-500 MG per tablet Take 1 tablet by mouth.       . latanoprost (XALATAN) 0.005 % ophthalmic solution Place 1 drop into both eyes at bedtime.      [name] levothyroxine (SYNTHROID, LEVOTHROID) 25 MCG tablet Take 25 mcg by mouth daily.      . multivitamin-lutein (OCUVITE-LUTEIN) CAPS Take 1 capsule by mouth daily.      [name] NITROSTAT 0.4 MG SL tablet       . NON FORMULARY CPAP machine       . omeprazole (PRILOSEC) 20 MG capsule Take 20 mg by mouth every other day.       . rosuvastatin (CRESTOR) 10 MG tablet Take 10 mg by mouth. Monday and Friday       No current facility-administered medications for this visit.    Allergies  Allergen Reactions  . Belviq (Lorcaserin Hcl)   . Morphine And Related Other (See Comments)    "MAKES ME CRAZY"  . Latex Rash    " IF ON ME FOR OVER 24 HOURS"    Past Medical History    Diagnosis Date  . Breast cancer   . Macular degeneration   . Glaucoma   . Sleep apnea   . Cataracts, bilateral   . Nodule of right lung   . Hypertension   . Hyperlipidemia   . Obesity     Past Surgical History  Procedure Laterality Date  . Cesarean section    . Cataract extraction, bilateral    . Total knee arthroplasty      x2    Family History  Problem Relation Age of Onset  . Diabetes Mother   . Macular degeneration Mother   . Heart attack Father   . Cancer Father     Colon and Prostate Cancer  . Diabetes Sister   . Diabetes Brother   . Heart attack Brother   . Diabetes Brother     History   Social History  . Marital Status: Married    Spouse Name: N/A    Number of Children: N/A  . Years of Education: N/A   Occupational History  . Not on file.   Social  History Main Topics  . Smoking status: Former Smoker -- 1.00 packs/day    Types: Cigarettes    Start date: 08/09/1960  . Smokeless tobacco: Never Used  . Alcohol Use: No  . Drug Use: No  . Sexually Active: Not on file   Other Topics Concern  . Not on file   Social History Narrative  . No narrative on file    ROS: Please see the HPI.  All other systems reviewed and negative.  PHYSICAL EXAM:  BP 154/94  Pulse 72  Ht 5' 1.5" (1.562 m)  Wt 219 lb (99.338 kg)  BMI 40.71 kg/m2  SpO2 98% .  Repeat measure of BP very difficult.  By doppler systolic wsa 145.    General: Well developed, well nourished, in no acute distress. Head:  Normocephalic and atraumatic. Neck: no JVD Lungs: Clear to auscultation and percussion. Heart: Normal S1 and S2.  No murmur, rubs or gallops.  Pulses: Pulses normal in all 4 extremities. Extremities: No clubbing or cyanosis. No edema. Neurologic: Alert and oriented x 3.  EKG:  NSR with first degree av block.  Premature atrial complexes.  Voltage criteria for LVH.  No acute changes.    ASSESSMENT AND PLAN:

## 2012-12-29 NOTE — Assessment & Plan Note (Addendum)
Followed by [facility]/oncology

## 2012-12-29 NOTE — Assessment & Plan Note (Signed)
Very hard to measure.  Multiple attempts today in left arm.  Continue to check.

## 2012-12-29 NOTE — Assessment & Plan Note (Signed)
Due for follow up in late summer.  Mild moderate bilateral plaque

## 2012-12-29 NOTE — Assessment & Plan Note (Signed)
Seems better at this point.

## 2012-12-29 NOTE — Assessment & Plan Note (Signed)
Under the care of her primary MD.

## 2013-02-13 ENCOUNTER — Other Ambulatory Visit: Payer: Self-pay

## 2013-02-13 DIAGNOSIS — D381 Neoplasm of uncertain behavior of trachea, bronchus and lung: Secondary | ICD-10-CM

## 2013-02-21 ENCOUNTER — Ambulatory Visit: Payer: Medicare Other | Admitting: Thoracic Surgery (Cardiothoracic Vascular Surgery)

## 2013-03-14 ENCOUNTER — Ambulatory Visit (INDEPENDENT_AMBULATORY_CARE_PROVIDER_SITE_OTHER): Payer: Medicare Other | Admitting: Thoracic Surgery (Cardiothoracic Vascular Surgery)

## 2013-03-14 ENCOUNTER — Ambulatory Visit
Admission: RE | Admit: 2013-03-14 | Discharge: 2013-03-14 | Disposition: A | Discharge status: Home / Self Care | Payer: Medicare Other | Source: Ambulatory Visit | Attending: Thoracic Surgery (Cardiothoracic Vascular Surgery) | Admitting: Thoracic Surgery (Cardiothoracic Vascular Surgery)

## 2013-03-14 ENCOUNTER — Encounter: Payer: Self-pay | Admitting: Thoracic Surgery (Cardiothoracic Vascular Surgery)

## 2013-03-14 VITALS — BP 158/88 | HR 72 | Resp 20 | Ht 61.5 in | Wt 219.0 lb

## 2013-03-14 DIAGNOSIS — D381 Neoplasm of uncertain behavior of trachea, bronchus and lung: Secondary | ICD-10-CM

## 2013-03-14 DIAGNOSIS — R911 Solitary pulmonary nodule: Secondary | ICD-10-CM

## 2013-03-14 DIAGNOSIS — J984 Other disorders of lung: Secondary | ICD-10-CM | POA: Diagnosis not present

## 2013-03-14 NOTE — Progress Notes (Signed)
HPI:  Amy Hahn returns for a scheduled 6 month followup visit.  Amy Hahn has a history of breast cancer back in 2001. In 2010 she had a CT scan which showed a 4.8 cm nodule in the right lower lobe. She then had another CT in a October 2013 which showed the lesion to be larger at 8 mm in diameter. A PET CT was done which showed the lesion was not hypermetabolic. I saw her in October and we decided to follow this lesion with serial CT scans.  She says that in the interim since her last visit she's been doing well. She's not having any chest pain or shortness of breath. She has not had hemoptysis. She has gained weight since a ankle injury in January limited her exercise. She has a past history of smoking but quit years ago.  Past Medical History  Diagnosis Date  . Breast cancer   . Macular degeneration   . Glaucoma   . Sleep apnea   . Cataracts, bilateral   . Nodule of right lung   . Hypertension   . Hyperlipidemia   . Obesity      Current Outpatient Prescriptions  Medication Sig Dispense Refill  . amLODipine (NORVASC) 10 MG tablet Take 1 tablet (10 mg total) by mouth daily.  90 tablet  3  . aspirin 81 MG tablet Take 81 mg by mouth daily.      . Azilsartan Medoxomil (EDARBI) 80 MG TABS Take 40 mg by mouth daily.      . cholecalciferol (VITAMIN D) 1000 UNITS tablet Take 5,000 Units by mouth daily.      . diazepam (VALIUM) 5 MG tablet Take 5 mg by mouth as directed.       . dorzolamide-timolol (COSOPT) 22.3-6.8 MG/ML ophthalmic solution Place 1 drop into both eyes as directed.       . furosemide (LASIX) 40 MG tablet Take 20 mg by mouth daily.       [name] HYDROcodone-acetaminophen (LORTAB) 7.5-500 MG per tablet Take 1 tablet by mouth.       . latanoprost (XALATAN) 0.005 % ophthalmic solution Place 1 drop into both eyes at bedtime.      [name] levothyroxine (SYNTHROID, LEVOTHROID) 25 MCG tablet Take 25 mcg by mouth daily.      . multivitamin-lutein (OCUVITE-LUTEIN) CAPS Take 1 capsule by mouth  daily.      [name] NITROSTAT 0.4 MG SL tablet       . NON FORMULARY CPAP machine       . omeprazole (PRILOSEC) 20 MG capsule Take 20 mg by mouth every other day.       . rosuvastatin (CRESTOR) 10 MG tablet Take 10 mg by mouth. Monday and Friday       No current facility-administered medications for this visit.    Physical Exam BP 158/88  Pulse 72  Resp 20  Ht 5' 1.5" (1.562 m)  Wt 219 lb (99.338 kg)  BMI 40.71 kg/m2  SpO2 62% Obese 77 year old woman in no acute distress Neurologic alert and oriented x3 with no focal motor deficits Lungs clear with equal breath sounds bilaterally No cervical or suprapubic or adenopathy Cardiac regular rate and rhythm 2/6 systolic murmur  Diagnostic Tests: CT of chest 03/14/2013 *RADIOLOGY REPORT*  Clinical Data: Right lower lobe lung nodule. History of right  breast cancer in 2000 with mastectomy. Chemotherapy and radiation  therapy. Remote history of smoking. Shortness of breath.  CT CHEST WITHOUT CONTRAST  Technique: Multidetector CT imaging  of the chest was performed  following the standard protocol without IV contrast.  Comparison: PET 07/25/2012. Chest CT 07/14/2012.  Findings: Lungs/pleura: Minimal radiation fibrosis in the right  upper lobe anteriorly.  Right lower lobe lung nodule which measures 8 x 8 mm on image  29/series 4. Similar to on the most recent exam, but enlarged  since 05/13/2009.  No new nodules. No pleural fluid.  Heart/Mediastinum: Mild similar diffuse thyroid enlargement. Right  mastectomy and axillary node dissection. No axillary adenopathy.  Moderate cardiomegaly, without pericardial effusion. Multivessel  coronary artery atherosclerosis.  Pulmonary artery enlargement, with outflow tract measuring 3.9 cm.  No mediastinal or definite hilar adenopathy, given limitations of  unenhanced CT.  Upper abdomen: Old granulomatous disease in the liver. Well-  circumscribed low density right hepatic lobe lesions are similar   and likely cysts. Fatty atrophy throughout the pancreas.  Cholecystectomy. Normal adrenal glands. Low density right renal  lesion which is likely a cyst at 2.5 cm. This is incompletely  imaged. There are also too small to characterize lesions within  the left kidney.  Bones/Musculoskeletal: No acute osseous abnormality. Accentuation  of expected thoracic kyphosis.  IMPRESSION:  1. Similar appearance of the right lower lobe lung nodules since  07/14/2012. This suggests an indolent nature. Given interval  enlargement since 05/13/2009, consider surveillance with follow-up  at 6 - 12 months.  2. No new or suspicious findings.  3. Cardiomegaly with coronary artery atherosclerosis.  4. Pulmonary artery enlargement suggests pulmonary arterial  hypertension.  Original Report Authenticated By: Jeronimo Greaves, M.D.  Impression: 77 year old woman with an 8 mm nodule in the right lower lobe. This is unchanged over 8 months. It is a round well circumscribed lesion. It had shown growth over a period of 3 years from 5-8 mm. It is not hypermetabolic by PET CT. This most likely is a benign tumor, possibly a hamartoma or carcinoid. With further consideration hamartoma is probably more likely. I recommended that we continue to follow this with CT scans as it has shown growth in the past. I recommended a CT in 6 months. She remains stable at that time we can probably go to annual scanning.

## 2013-03-16 DIAGNOSIS — R7309 Other abnormal glucose: Secondary | ICD-10-CM | POA: Diagnosis not present

## 2013-03-16 DIAGNOSIS — E559 Vitamin D deficiency, unspecified: Secondary | ICD-10-CM | POA: Diagnosis not present

## 2013-03-16 DIAGNOSIS — I10 Essential (primary) hypertension: Secondary | ICD-10-CM | POA: Diagnosis not present

## 2013-03-16 DIAGNOSIS — E039 Hypothyroidism, unspecified: Secondary | ICD-10-CM | POA: Diagnosis not present

## 2013-03-21 DIAGNOSIS — E785 Hyperlipidemia, unspecified: Secondary | ICD-10-CM | POA: Diagnosis not present

## 2013-03-21 DIAGNOSIS — I10 Essential (primary) hypertension: Secondary | ICD-10-CM | POA: Diagnosis not present

## 2013-03-21 DIAGNOSIS — E559 Vitamin D deficiency, unspecified: Secondary | ICD-10-CM | POA: Diagnosis not present

## 2013-03-21 DIAGNOSIS — E039 Hypothyroidism, unspecified: Secondary | ICD-10-CM | POA: Diagnosis not present

## 2013-03-22 ENCOUNTER — Other Ambulatory Visit: Payer: Self-pay | Admitting: Family Medicine

## 2013-03-28 DIAGNOSIS — H4011X0 Primary open-angle glaucoma, stage unspecified: Secondary | ICD-10-CM | POA: Diagnosis not present

## 2013-03-28 DIAGNOSIS — Z961 Presence of intraocular lens: Secondary | ICD-10-CM | POA: Diagnosis not present

## 2013-05-08 ENCOUNTER — Ambulatory Visit (HOSPITAL_BASED_OUTPATIENT_CLINIC_OR_DEPARTMENT_OTHER): Payer: Medicare Other | Admitting: Hematology and Oncology

## 2013-05-08 ENCOUNTER — Other Ambulatory Visit (HOSPITAL_BASED_OUTPATIENT_CLINIC_OR_DEPARTMENT_OTHER): Payer: Medicare Other | Admitting: Lab

## 2013-05-08 ENCOUNTER — Telehealth: Payer: Self-pay | Admitting: Hematology and Oncology

## 2013-05-08 VITALS — BP 147/83 | HR 72 | Temp 97.7°F | Resp 20 | Ht 61.5 in | Wt 223.2 lb

## 2013-05-08 DIAGNOSIS — R911 Solitary pulmonary nodule: Secondary | ICD-10-CM

## 2013-05-08 DIAGNOSIS — Z853 Personal history of malignant neoplasm of breast: Secondary | ICD-10-CM

## 2013-05-08 DIAGNOSIS — C50911 Malignant neoplasm of unspecified site of right female breast: Secondary | ICD-10-CM

## 2013-05-08 LAB — CBC WITH DIFFERENTIAL/PLATELET
Basophils Absolute: 0 10*3/uL (ref 0.0–0.1)
EOS%: 1.9 % (ref 0.0–7.0)
Eosinophils Absolute: 0.1 10*3/uL (ref 0.0–0.5)
HGB: 13.6 g/dL (ref 11.6–15.9)
MCH: 33.5 pg (ref 25.1–34.0)
MCV: 97.9 fL (ref 79.5–101.0)
MONO%: 9.1 % (ref 0.0–14.0)
NEUT#: 4 10*3/uL (ref 1.5–6.5)
RBC: 4.07 10*6/uL (ref 3.70–5.45)
RDW: 12.8 % (ref 11.2–14.5)
lymph#: 2 10*3/uL (ref 0.9–3.3)

## 2013-05-08 LAB — COMPREHENSIVE METABOLIC PANEL (CC13)
ALT: 10 U/L (ref 0–55)
Albumin: 3.5 g/dL (ref 3.5–5.0)
Alkaline Phosphatase: 71 U/L (ref 40–150)
Potassium: 3.7 mEq/L (ref 3.5–5.1)
Sodium: 146 mEq/L — ABNORMAL HIGH (ref 136–145)
Total Bilirubin: 0.44 mg/dL (ref 0.20–1.20)
Total Protein: 7.4 g/dL (ref 6.4–8.3)

## 2013-05-08 LAB — LACTATE DEHYDROGENASE (CC13): LDH: 191 U/L (ref 125–245)

## 2013-05-08 NOTE — Progress Notes (Signed)
Patient ID: Amy Hahn, female   DOB: 1936/03/12, 77 y.o.   MRN: [id number] CSN: [id number]  CC: Amy Maroon, MD  Salvatore Decent Dorris Fetch, MD  Problem List: Amy Hahn is a 77 y.o. Caucasian female with a problem list consisting of:  1. Poorly differentiated grade 3 carcinoma of the right breast with 4/22 positive lymph nodes, extracapsular spread and positive hormone receptors, stage IIB, T2 N1 dating back to April 2001.  Estrogen receptor was 89%, progesterone receptor 10% and HER-2/neu was negative. The patient underwent a right modified radical mastectomy on 01/27/2000 followed by adjuvant chemotherapy consisting of Cytoxan, Adriamycin and Taxol from 06/23/2000 through 11/2000. She then received radiation to the right chest wall through 01/2001 and then was on adjuvant Arimidex from 11/2000 through 12/2005. The patient has remained disease free.  2. Hypertension 3. Goiter 4. Dyslipidemia 5. Osteopenia 6. Macular degeneration 7. Possible TIA in early 08/2000.  8. Hemangioma of the right hepatic lobe noted 02/2000.  9. Right lower lobe lung nodule noted 10/20/2007. This lesion was stable measuring 5 mm, up to a chest x-ray carried out on 06/03/2010. Recent imaging studies include a chest x-ray carried out on 07/11/2012, a CT scan of the chest on 07/14/2012, and a  PET scan from 07/25/2012. These imaging studies show that this lesion has increased, now measures 8 mm. PET scan showed no hypermetabolic activity associated with the right lower lobe lung nodule. The patient was seen by Dr. Charlett Lango on 08/09/2012.    I saw [name] today for a follow-up visit of her history of cancer to the right breast dating back to April 2001 and mildly increased right lower lobe nodule to 8 mm. The patient has remained disease free and her condition remains relatively unchanged.   Since her last office visit, the patient state that she feels great and does not have any complaints today.   She is  not symptomatic and denies any symptomatology today including SOB, pain, chest pain, fever, chills, N/V/D or constipation, unusual bleeding and any breast changes.    Past Medical History: Past Medical History  Diagnosis Date  . Breast cancer   . Macular degeneration   . Glaucoma   . Sleep apnea   . Cataracts, bilateral   . Nodule of right lung   . Hypertension   . Hyperlipidemia   . Obesity     Surgical History: Past Surgical History  Procedure Laterality Date  . Cesarean section    . Cataract extraction, bilateral    . Total knee arthroplasty      x2    Current Medications: Current Outpatient Prescriptions  Medication Sig Dispense Refill  . amLODipine (NORVASC) 10 MG tablet Take 1 tablet (10 mg total) by mouth daily.  90 tablet  3  . aspirin 81 MG tablet Take 81 mg by mouth daily.      . Azilsartan Medoxomil (EDARBI) 80 MG TABS Take 40 mg by mouth daily.      . cholecalciferol (VITAMIN D) 1000 UNITS tablet Take 5,000 Units by mouth daily.      . diazepam (VALIUM) 5 MG tablet Take 5 mg by mouth as directed.       . dorzolamide-timolol (COSOPT) 22.3-6.8 MG/ML ophthalmic solution Place 1 drop into both eyes as directed.       . furosemide (LASIX) 40 MG tablet Take 20 mg by mouth daily.       [name] HYDROcodone-acetaminophen (LORTAB) 7.5-500 MG per tablet Take 1  tablet by mouth.       . latanoprost (XALATAN) 0.005 % ophthalmic solution Place 1 drop into both eyes at bedtime.      [name] levothyroxine (SYNTHROID, LEVOTHROID) 25 MCG tablet Take 25 mcg by mouth daily.      . multivitamin-lutein (OCUVITE-LUTEIN) CAPS Take 1 capsule by mouth daily.      [name] NITROSTAT 0.4 MG SL tablet       . NON FORMULARY CPAP machine       . omeprazole (PRILOSEC) 20 MG capsule Take 20 mg by mouth every other day.       . rosuvastatin (CRESTOR) 10 MG tablet Take 10 mg by mouth. Monday and Friday       No current facility-administered medications for this visit.    Allergies: Allergies  Allergen  Reactions  . Belviq (Lorcaserin Hcl)   . Morphine And Related Other (See Comments)    "MAKES ME CRAZY"  . Latex Rash    " IF ON ME FOR OVER 24 HOURS"    Family History: Family History  Problem Relation Age of Onset  . Diabetes Mother   . Macular degeneration Mother   . Heart attack Father   . Cancer Father     Colon and Prostate Cancer  . Diabetes Sister   . Diabetes Brother   . Heart attack Brother   . Diabetes Brother     Social History: History  Substance Use Topics  . Smoking status: Former Smoker -- 1.00 packs/day    Types: Cigarettes    Start date: 08/09/1960  . Smokeless tobacco: Never Used  . Alcohol Use: No    Review of Systems: 10 Point review of systems was completed and is negative except as noted above.   Physical Exam:   Blood pressure 147/83, pulse 72, temperature 97.7 F (36.5 C), temperature source Oral, resp. rate 20, height 5' 1.5" (1.562 m), weight 223 lb 3.2 oz (101.243 kg).  O2 sats 97% on RA.  General appearance: Alert, cooperative, well nourished, obese, no apparent distress Head: Normocephalic, without obvious abnormality, atraumatic Eyes: Conjunctivae/corneas clear, PERRLA, EOMI Nose: Nares, septum and mucosa are normal, no drainage or sinus tenderness Neck: No adenopathy, supple, symmetrical, trachea midline, thyroid slightly enlarged, no tenderness Resp: Clear to auscultation bilaterally, diminished bibasilar breath sounds Cardio: Regular rate and rhythm, S1, S2 normal, 1/6 murmur, no click, rub or gallop Breasts: Right breast is surgically absent with no evidence for chest wall recurrence. Left breast is without evidence of cancer. No suspicious findings. No axillary adenopathy. GI: Soft, distended, non-tender, hypoactive bowel sounds, no organomegaly Extremities: Extremities normal, atraumatic, no cyanosis or edema Lymph nodes: Cervical, supraclavicular, and axillary nodes normal Neurologic: Grossly normal   Laboratory Data: Results  for orders placed in visit on 05/08/13 (from the past 48 hour(s))  CBC WITH DIFFERENTIAL     Status: None   Collection Time    05/08/13  1:03 PM      Result Value Range   WBC 6.8  3.9 - 10.3 10e3/uL   NEUT# 4.0  1.5 - 6.5 10e3/uL   HGB 13.6  11.6 - 15.9 g/dL   HCT 56.2  13.0 - 86.5 %   Platelets 266  145 - 400 10e3/uL   MCV 97.9  79.5 - 101.0 fL   MCH 33.5  25.1 - 34.0 pg   MCHC 34.2  31.5 - 36.0 g/dL   RBC 7.84  6.96 - 2.95 10e6/uL   RDW 12.8  11.2 - 14.5 %  lymph# 2.0  0.9 - 3.3 10e3/uL   MONO# 0.6  0.1 - 0.9 10e3/uL   Eosinophils Absolute 0.1  0.0 - 0.5 10e3/uL   Basophils Absolute 0.0  0.0 - 0.1 10e3/uL   NEUT% 59.5  38.4 - 76.8 %   LYMPH% 29.1  14.0 - 49.7 %   MONO% 9.1  0.0 - 14.0 %   EOS% 1.9  0.0 - 7.0 %   BASO% 0.4  0.0 - 2.0 %  LACTATE DEHYDROGENASE (CC13)     Status: None   Collection Time    05/08/13  1:03 PM      Result Value Range   LDH 191  125 - 245 U/L  COMPREHENSIVE METABOLIC PANEL (CC13)     Status: Abnormal   Collection Time    05/08/13  1:03 PM      Result Value Range   Sodium 146 (*) 136 - 145 mEq/L   Potassium 3.7  3.5 - 5.1 mEq/L   Chloride 109  98 - 109 mEq/L   CO2 26  22 - 29 mEq/L   Glucose 160 (*) 70 - 140 mg/dl   BUN 82.9  7.0 - 56.2 mg/dL   Creatinine 1.2 (*) 0.6 - 1.1 mg/dL   Total Bilirubin 1.30  0.20 - 1.20 mg/dL   Alkaline Phosphatase 71  40 - 150 U/L   AST 20  5 - 34 U/L   ALT 10  0 - 55 U/L   Total Protein 7.4  6.4 - 8.3 g/dL   Albumin 3.5  3.5 - 5.0 g/dL   Calcium 86.5 (*) 8.4 - 10.4 mg/dL     Imaging Studies: 1. Chest CT scan without IV contrast from 05/13/2009 showed a 4 mm well differentiated right lower lobe nodule which is felt to be  stable.  2. Chest x-ray, 2 view, from 06/03/2010 showed a 5 mm right lower lobe nodule which is stable. No other nodules were present.  3. Digital left screening mammogram on 07/13/2011 was negative.  4. Thyroid ultrasound from 05/30/2012 showed thyromegaly. There were small left lobe and  isthmic nodules. Findings did not meet current SRU consensus criteria for biopsy.  5. Chest x-ray, 2 view, on 07/11/2012 showed that the right lower lobe pulmonary nodule measured 8 mm, as compared with 5 mm on the prior chest x-ray from 06/03/2010 and the chest CT scan from 05/13/2009.  6. CT of the chest without IV contrast on 07/14/2012 showed that the right lower lobe pulmonary nodule measured 8 mm, was felt to be  slightly increased compared to the prior CT scan of 3 years ago. In the differential is a carcinoid tumor. The possibility of a lung metastasis from breast cancer was also raised.  7. PET scan from 07/25/2012 showed no hypermetabolic activity associated with a right lower lobe pulmonary nodule, which measured  9 mm on image #90. There was diffuse metabolic activity within the thyroid gland, most consistent with thyroiditis. There were no  hypermetabolic mediastinal lymph nodes or any other suspicious lesions.  8. Mammogram of the left breast on 07/26/2012 was negative.   Impression/Plan: [name] continues to do well with no signs of recurrent breast cancer, now almost 13 years from the time of diagnosis. She has not experienced any symptoms related to the lung nodule.  [name] is was seen by Dr. Dorris Fetch in 02/2013 with a repeat CT of the chest was stable, plan for repeat one in 6 months.  We plan to see [name]  again in approximately 6 months on  at which time we will check a CBC, CMP and LDH and she will have repeat chest Ct.  [name] is encouraged to contact us in the interim if she has any questions or concerns. She said she had a mammogram last month, will ask for the result  Maleko Greulich E,  05/08/2013, 2:13 PM

## 2013-05-08 NOTE — Telephone Encounter (Signed)
gv pt appt schedule for February 2015. Per 8/4 pof f/u visit after ct scan is done and pt has seen Dr. Vladimir Faster. Per pt she is do for another ct/Dr. Dorris Fetch in December 2014 and Dr. Dorris Fetch is ordering ct.

## 2013-05-18 DIAGNOSIS — G4730 Sleep apnea, unspecified: Secondary | ICD-10-CM | POA: Diagnosis not present

## 2013-05-22 ENCOUNTER — Encounter (INDEPENDENT_AMBULATORY_CARE_PROVIDER_SITE_OTHER): Payer: Medicare Other

## 2013-05-22 DIAGNOSIS — I6529 Occlusion and stenosis of unspecified carotid artery: Secondary | ICD-10-CM

## 2013-06-05 ENCOUNTER — Other Ambulatory Visit: Payer: Self-pay | Admitting: Internal Medicine

## 2013-06-20 DIAGNOSIS — Z23 Encounter for immunization: Secondary | ICD-10-CM | POA: Diagnosis not present

## 2013-07-31 ENCOUNTER — Other Ambulatory Visit: Payer: Self-pay

## 2013-07-31 DIAGNOSIS — Z1231 Encounter for screening mammogram for malignant neoplasm of breast: Secondary | ICD-10-CM

## 2013-08-02 IMAGING — CT NM PET TUM IMG INITIAL (PI) SKULL BASE T - THIGH
6 series · 25 of 25 positions shown · IV contrast (350 OM)
Comparison: CT 07/14/2012

CLINICAL DATA: Initial treatment strategy for solitary pulmonary
nodule. Right breast cancer

NUCLEAR MEDICINE PET SKULL BASE TO THIGH
Fasting Blood Glucose:  81
TECHNIQUE: 19.9 mCi F-18 FDG was injected intravenously. CT data
was obtained and used for attenuation correction and anatomic
localization only.  (This was not acquired as a diagnostic CT
examination.) Additional exam technical data entered on
technologist worksheet.

[Series 1: pet ac · axial · 3.3mm · 4.69mm/px · z∈[-870,-0]mm · 5 of 267 slices shown]
[im 1/267]
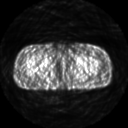
[im 67/267]
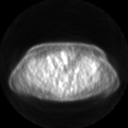
[im 134/267]
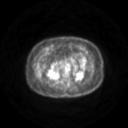
[im 200/267]
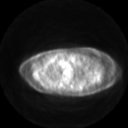
[im 267/267]
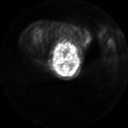

[Series 2: pet nac · axial · 3.3mm · 4.69mm/px · z∈[-870,-0]mm · 6 of 267 slices shown]
[im 1/267]
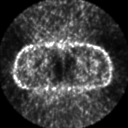
[im 54/267]
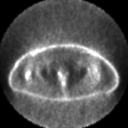
[im 107/267]
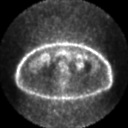
[im 160/267]
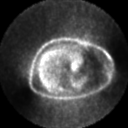
[im 213/267]
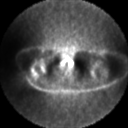
[im 267/267]
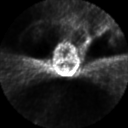

[Series 2: ct images · axial · 3.8mm · 0.98mm/px · z∈[-870,-0]mm · 6 of 267 slices shown]
[im 1/267]
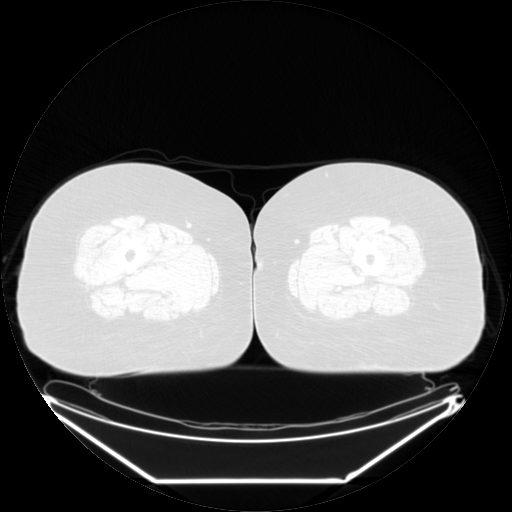
[im 54/267]
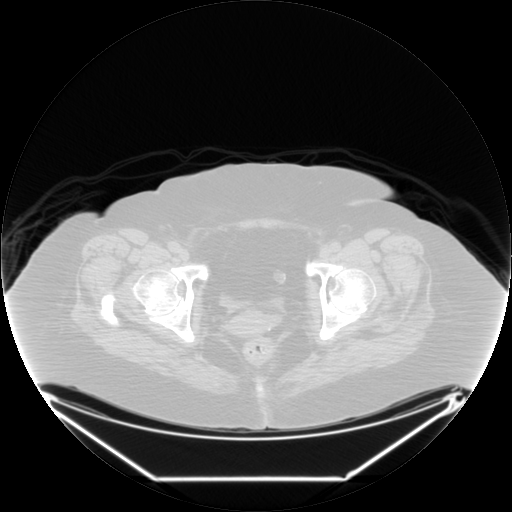
[im 107/267]
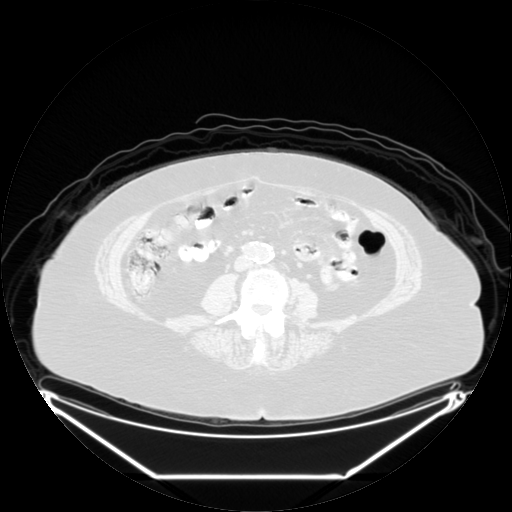
[im 160/267]
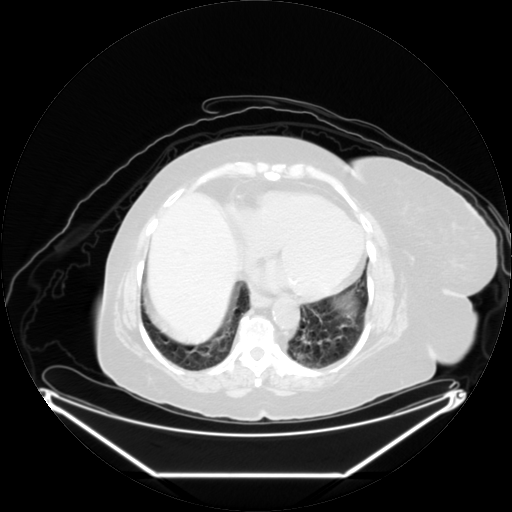
[im 213/267]
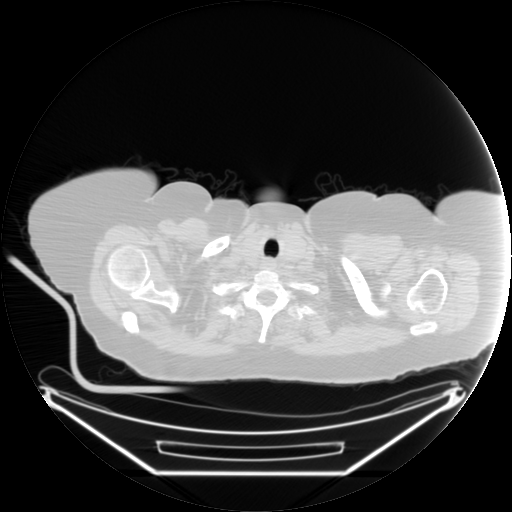
[im 267/267  brain]
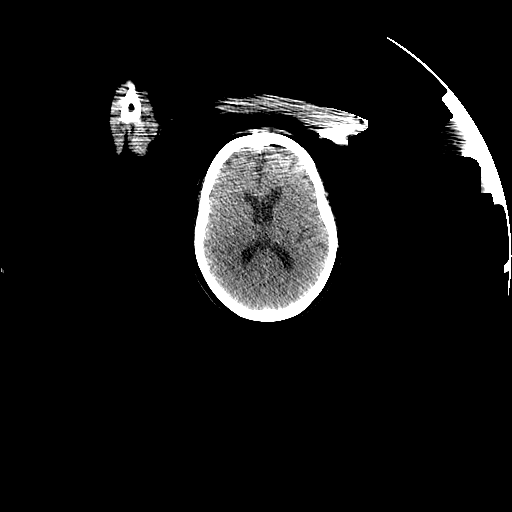

[Series 123: mip · coronal · 3.3mm · 4.69mm/px · 1 of 30 slices shown]
[im 1/30]
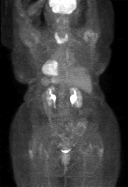

[Series 151: reformatted · axial · 3.3mm · 3.91mm/px · z∈[-870,-0]mm · 6 of 265 slices shown (1 of 2)]
[im 1/265]
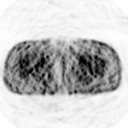
[im 53/265]
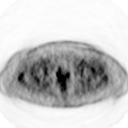
[im 106/265]
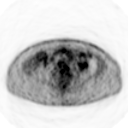
[im 159/265]
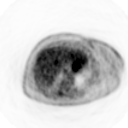
[im 212/265]
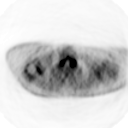
[im 265/265]
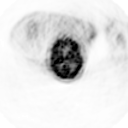

[Series 153: reformatted · coronal · 4.7mm · 6.98mm/px · 1 of 67 slices shown (2 of 2)]
[im 1/67]
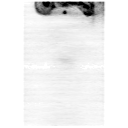

[25 of 25 positions shown; findings below may reference images not displayed]

FINDINGS: Neck: No hypermetabolic lymph nodes in the neck. } There is diffuse
metabolic activity within the thyroid gland consistent with
thyroiditis.

Chest:  There is no clear hypermetabolic active associated with the
right lobe pulmonary nodule.  Nodule measures 9 mm (image #90) and
is near the diaphragm.  The size and position of this nodule does
limit accurate characterization with PET imaging.  No
hypermetabolic mediastinal lymph nodes.  There is atelectasis in
the right middle lobe.

Abdomen/Pelvis:  No abnormal hypermetabolic activity within the
liver, pancreas, adrenal glands, or spleen.  No hypermetabolic
lymph nodes in the abdomen or pelvis.

Skeleton:  No focal hypermetabolic activity to suggest skeletal
metastasis.
IMPRESSION: 1. No hypermetabolic active associated right lower lobe pulmonary
nodule.  Recommend follow-up CT thorax without contrast in 3 to 6
months to evaluate stability.
2.  Diffuse metabolic activity within the thyroid gland is most
consistent with thyroiditis.

## 2013-08-15 DIAGNOSIS — H353190 Nonexudative age-related macular degeneration, unspecified eye, stage unspecified: Secondary | ICD-10-CM | POA: Diagnosis not present

## 2013-08-15 DIAGNOSIS — H33059 Total retinal detachment, unspecified eye: Secondary | ICD-10-CM | POA: Diagnosis not present

## 2013-08-15 DIAGNOSIS — Z961 Presence of intraocular lens: Secondary | ICD-10-CM | POA: Diagnosis not present

## 2013-08-15 DIAGNOSIS — H20019 Primary iridocyclitis, unspecified eye: Secondary | ICD-10-CM | POA: Diagnosis not present

## 2013-08-15 DIAGNOSIS — H16049 Marginal corneal ulcer, unspecified eye: Secondary | ICD-10-CM | POA: Diagnosis not present

## 2013-08-15 DIAGNOSIS — H1045 Other chronic allergic conjunctivitis: Secondary | ICD-10-CM | POA: Diagnosis not present

## 2013-08-15 DIAGNOSIS — H4011X0 Primary open-angle glaucoma, stage unspecified: Secondary | ICD-10-CM | POA: Diagnosis not present

## 2013-08-15 DIAGNOSIS — H04129 Dry eye syndrome of unspecified lacrimal gland: Secondary | ICD-10-CM | POA: Diagnosis not present

## 2013-08-18 DIAGNOSIS — H01029 Squamous blepharitis unspecified eye, unspecified eyelid: Secondary | ICD-10-CM | POA: Diagnosis not present

## 2013-08-18 DIAGNOSIS — H15009 Unspecified scleritis, unspecified eye: Secondary | ICD-10-CM | POA: Diagnosis not present

## 2013-08-18 DIAGNOSIS — H04129 Dry eye syndrome of unspecified lacrimal gland: Secondary | ICD-10-CM | POA: Diagnosis not present

## 2013-08-18 DIAGNOSIS — H1045 Other chronic allergic conjunctivitis: Secondary | ICD-10-CM | POA: Diagnosis not present

## 2013-08-29 ENCOUNTER — Other Ambulatory Visit: Payer: Self-pay

## 2013-08-29 DIAGNOSIS — D381 Neoplasm of uncertain behavior of trachea, bronchus and lung: Secondary | ICD-10-CM

## 2013-09-04 ENCOUNTER — Ambulatory Visit: Payer: Medicare Other

## 2013-09-13 DIAGNOSIS — R7309 Other abnormal glucose: Secondary | ICD-10-CM | POA: Diagnosis not present

## 2013-09-13 DIAGNOSIS — E039 Hypothyroidism, unspecified: Secondary | ICD-10-CM | POA: Diagnosis not present

## 2013-09-13 DIAGNOSIS — E559 Vitamin D deficiency, unspecified: Secondary | ICD-10-CM | POA: Diagnosis not present

## 2013-09-13 DIAGNOSIS — I10 Essential (primary) hypertension: Secondary | ICD-10-CM | POA: Diagnosis not present

## 2013-09-18 DIAGNOSIS — E039 Hypothyroidism, unspecified: Secondary | ICD-10-CM | POA: Diagnosis not present

## 2013-09-18 DIAGNOSIS — E559 Vitamin D deficiency, unspecified: Secondary | ICD-10-CM | POA: Diagnosis not present

## 2013-09-18 DIAGNOSIS — I10 Essential (primary) hypertension: Secondary | ICD-10-CM | POA: Diagnosis not present

## 2013-09-18 DIAGNOSIS — E785 Hyperlipidemia, unspecified: Secondary | ICD-10-CM | POA: Diagnosis not present

## 2013-09-19 ENCOUNTER — Other Ambulatory Visit: Payer: Medicare Other

## 2013-09-19 ENCOUNTER — Ambulatory Visit: Payer: Medicare Other | Admitting: Thoracic Surgery (Cardiothoracic Vascular Surgery)

## 2013-10-02 ENCOUNTER — Ambulatory Visit
Admission: RE | Admit: 2013-10-02 | Discharge: 2013-10-02 | Disposition: A | Discharge status: Home / Self Care | Payer: Medicare Other | Source: Ambulatory Visit

## 2013-10-02 ENCOUNTER — Other Ambulatory Visit: Payer: Self-pay

## 2013-10-02 DIAGNOSIS — Z1231 Encounter for screening mammogram for malignant neoplasm of breast: Secondary | ICD-10-CM

## 2013-10-17 ENCOUNTER — Other Ambulatory Visit: Payer: Medicare Other

## 2013-10-17 ENCOUNTER — Ambulatory Visit: Payer: Medicare Other | Admitting: Thoracic Surgery (Cardiothoracic Vascular Surgery)

## 2013-10-31 ENCOUNTER — Ambulatory Visit: Payer: Medicare Other | Admitting: Thoracic Surgery (Cardiothoracic Vascular Surgery)

## 2013-11-06 ENCOUNTER — Ambulatory Visit (HOSPITAL_BASED_OUTPATIENT_CLINIC_OR_DEPARTMENT_OTHER): Payer: Medicare Other | Admitting: Internal Medicine

## 2013-11-06 ENCOUNTER — Telehealth: Payer: Self-pay | Admitting: Internal Medicine

## 2013-11-06 VITALS — BP 157/87 | HR 66 | Temp 97.3°F | Resp 18 | Ht 61.5 in | Wt 220.6 lb

## 2013-11-06 DIAGNOSIS — R911 Solitary pulmonary nodule: Secondary | ICD-10-CM | POA: Diagnosis not present

## 2013-11-06 DIAGNOSIS — Z853 Personal history of malignant neoplasm of breast: Secondary | ICD-10-CM | POA: Diagnosis not present

## 2013-11-06 DIAGNOSIS — C50911 Malignant neoplasm of unspecified site of right female breast: Secondary | ICD-10-CM

## 2013-11-06 NOTE — Telephone Encounter (Signed)
Gave pt appt for lab and MD for July 2015 °

## 2013-11-06 NOTE — Progress Notes (Signed)
[facility] OFFICE PROGRESS NOTE  Amy Hahn, [place] [address]  DIAGNOSIS: Breast cancer, right breast - Plan: CBC with Differential, Comprehensive metabolic panel (Cmet) - [facility]  Lung nodule, solitary  Chief Complaint  Patient presents with  . Breast cancer, right breast    CURRENT THERAPY: Observation.   INTERVAL HISTORY: Amy Hahn 78 y.o. female with a problem list consisting of:   1. Poorly differentiated grade 3 carcinoma of the right breast with 4/22 positive lymph nodes, extracapsular spread and positive hormone receptors, stage IIB, T2 N1 dating back to April 2001. Estrogen receptor was 89%, progesterone receptor 10% and HER-2/neu was negative. The patient underwent a right modified radical mastectomy on 01/27/2000 followed by adjuvant chemotherapy consisting of Cytoxan, Adriamycin and Taxol from 06/23/2000 through 11/2000. She then received radiation to the right chest wall through 01/2001 and then was on adjuvant Arimidex from 11/2000 through 12/2005. The patient has remained disease free.  2. Hypertension  3. Goiter  4. Dyslipidemia  5. Osteopenia  6. Macular degeneration  7. Possible TIA in early 08/2000.  8. Hemangioma of the right hepatic lobe noted 02/2000.  9. Right lower lobe lung nodule noted 10/20/2007. This lesion was stable measuring 5 mm, up to a chest x-ray carried out on 06/03/2010. Recent imaging studies include a chest x-ray carried out on 07/11/2012, a CT scan of the chest on 07/14/2012, and a  PET scan from 07/25/2012. These imaging studies show that this lesion has increased, now measures 8 mm. PET scan showed no hypermetabolic activity associated with the right lower lobe lung nodule. The patient was seen by Dr. Modesto Hahn on 03/14/2013.   I saw Amy Hahn today for a follow-up visit of her history of cancer to the right breast dating back to April 2001 and mildly increased right lower lobe  nodule to 8 mm. The patient has remained disease free and her condition remains relatively unchanged. Since her last office visit, the patient state that she feels great and does not have any complaints today. She is not symptomatic and denies any symptomatology today including SOB, pain, chest pain, fever, chills, N/V/D or constipation, unusual bleeding and any breast changes.    MEDICAL HISTORY: Past Medical History  Diagnosis Date  . Breast cancer   . Macular degeneration   . Glaucoma   . Sleep apnea   . Cataracts, bilateral   . Nodule of right lung   . Hypertension   . Hyperlipidemia   . Obesity     INTERIM HISTORY: has HYPOTHYROIDISM-IATROGENIC; HYPERTENSION, BENIGN; Dizziness; Chest pain; Palpitations; Bradycardia; Carotid stenosis; Breast cancer, right breast; and Lung nodule, solitary on her problem list.    ALLERGIES:  is allergic to belviq; morphine and related; and latex.  MEDICATIONS: has a current medication list which includes the following prescription(s): amlodipine, aspirin, azilsartan medoxomil, cholecalciferol, diazepam, dorzolamide-timolol, furosemide, latanoprost, levothyroxine, multivitamin-lutein, nitrostat, NON FORMULARY, omeprazole, rosuvastatin, and UNABLE TO FIND.  SURGICAL HISTORY:  Past Surgical History  Procedure Laterality Date  . Cesarean section    . Cataract extraction, bilateral    . Total knee arthroplasty      x2    REVIEW OF SYSTEMS:   Constitutional: Denies fevers, chills or abnormal weight loss Eyes: Denies blurriness of vision Ears, nose, mouth, throat, and face: Denies mucositis or sore throat Respiratory: Denies cough, dyspnea or wheezes Cardiovascular: Denies palpitation, chest discomfort or lower extremity swelling Gastrointestinal:  Denies nausea, heartburn or change  in bowel habits Skin: Denies abnormal skin rashes Lymphatics: Denies new lymphadenopathy or easy bruising Neurological:Denies numbness, tingling or new  weaknesses Behavioral/Psych: Mood is stable, no new changes  All other systems were reviewed with the patient and are negative.  PHYSICAL EXAMINATION: ECOG PERFORMANCE STATUS: 0 - Asymptomatic  Blood pressure 157/87, pulse 66, temperature 97.3 F (36.3 C), temperature source Oral, resp. rate 18, height 5' 1.5" (1.562 m), weight 220 lb 9.6 oz (100.064 kg), SpO2 96.00%.  GENERAL:alert, no distress and comfortable SKIN: skin color, texture, turgor are normal, no rashes or significant lesions EYES: normal, Conjunctiva are pink and non-injected, sclera clear OROPHARYNX:no exudate, no erythema and lips, buccal mucosa, and tongue normal  NECK: supple, thyroid normal size, non-tender, without nodularity LYMPH:  no palpable lymphadenopathy in the cervical, axillary or supraclavicular LUNGS: clear to auscultation and percussion with normal breathing effort HEART: regular rate & rhythm and SEM 1/6 and no lower extremity edema Breasts: Right breast is surgically absent with no evidence for chest wall recurrence. Left breast is without evidence of cancer. No suspicious findings. No axillary adenopathy. ABDOMEN:abdomen soft, non-tender and normal bowel sounds Musculoskeletal:no cyanosis of digits and no clubbing  NEURO: alert & oriented x 3 with fluent speech, no focal motor/sensory deficits  Labs:  Lab Results  Component Value Date   WBC 6.8 05/08/2013   HGB 13.6 05/08/2013   HCT 39.8 05/08/2013   MCV 97.9 05/08/2013   PLT 266 05/08/2013   NEUTROABS 4.0 05/08/2013      Chemistry      Component Value Date/Time   NA 146* 05/08/2013 1303   NA 141 06/25/2011 1320   K 3.7 05/08/2013 1303   K 4.0 06/25/2011 1320   CL 108* 07/11/2012 1400   CL 106 06/25/2011 1320   CO2 26 05/08/2013 1303   CO2 25 06/25/2011 1320   BUN 22.0 05/08/2013 1303   BUN 13 06/25/2011 1320   CREATININE 1.2* 05/08/2013 1303   CREATININE 0.96 06/25/2011 1320      Component Value Date/Time   CALCIUM 10.6* 05/08/2013 1303   CALCIUM 9.2 06/25/2011  1320   ALKPHOS 71 05/08/2013 1303   ALKPHOS 80 06/25/2011 1320   AST 20 05/08/2013 1303   AST 21 06/25/2011 1320   ALT 10 05/08/2013 1303   ALT 10 06/25/2011 1320   BILITOT 0.44 05/08/2013 1303   BILITOT 0.5 06/25/2011 1320      Studies:  No results found.   RADIOGRAPHIC STUDIES: 1. Chest CT scan without IV contrast from 05/13/2009 showed a 4 mm well differentiated right lower lobe nodule which is felt to be  stable.  2. Chest x-ray, 2 view, from 06/03/2010 showed a 5 mm right lower lobe nodule which is stable. No other nodules were present.  3. Digital left screening mammogram on 07/13/2011 was negative.  4. Thyroid ultrasound from 05/30/2012 showed thyromegaly. There were small left lobe and isthmic nodules. Findings did not meet current SRU consensus criteria for biopsy.  5. Chest x-ray, 2 view, on 07/11/2012 showed that the right lower lobe pulmonary nodule measured 8 mm, as compared with 5 mm on the prior chest x-ray from 06/03/2010 and the chest CT scan from 05/13/2009.  6. CT of the chest without IV contrast on 07/14/2012 showed that the right lower lobe pulmonary nodule measured 8 mm, was felt to be slightly increased compared to the prior CT scan of 3 years ago. In the differential is a carcinoid tumor. The possibility of a lung metastasis from  breast cancer was also raised.  7. PET scan from 07/25/2012 showed no hypermetabolic activity associated with a right lower lobe pulmonary nodule, which measured  9 mm on image #90. There was diffuse metabolic activity within the thyroid gland, most consistent with thyroiditis. There were no  hypermetabolic mediastinal lymph nodes or any other suspicious lesions.  8. Mammogram of the left breast on 07/26/2012 was negative.  9. Mammogram of the left breast on 10/02/2013 was negative.   ASSESSMENT: Amy Hahn 78 y.o. female with a history of Breast cancer, right breast - Plan: CBC with Differential, Comprehensive metabolic panel (Cmet) -  [facility]  Lung nodule, solitary   PLAN:   1. Breast Cancer. --Amy Hahn continues to do well with no signs of recurrent breast cancer, now almost 13 years from the time of diagnosis.  She said she had a mammogram last month as reported above and was negative.   2. Solitary Lung nodule.  --She has not experienced any symptoms related to the lung nodule. Amy Hahn is was seen by Dr. Roxan Hockey in 03/2013 with a repeat CT of the chest was stable, plan for repeat tomorrow.   3. Follow-up. --We plan to see Amy Hahn again in approximately 6 months on at which time we will check a CBC, CMP and LDH.  All questions were answered. The patient knows to call the clinic with any problems, questions or concerns. We can certainly see the patient much sooner if necessary.  I spent 10 minutes counseling the patient face to face. The total time spent in the appointment was 15 minutes.    Amy Huisman, MD 11/07/2013 5:07 AM

## 2013-11-07 ENCOUNTER — Ambulatory Visit
Admission: RE | Admit: 2013-11-07 | Discharge: 2013-11-07 | Disposition: A | Discharge status: Home / Self Care | Payer: Medicare Other | Source: Ambulatory Visit | Attending: Thoracic Surgery (Cardiothoracic Vascular Surgery) | Admitting: Thoracic Surgery (Cardiothoracic Vascular Surgery)

## 2013-11-07 ENCOUNTER — Ambulatory Visit (INDEPENDENT_AMBULATORY_CARE_PROVIDER_SITE_OTHER): Payer: Medicare Other | Admitting: Thoracic Surgery (Cardiothoracic Vascular Surgery)

## 2013-11-07 ENCOUNTER — Encounter: Payer: Self-pay | Admitting: Thoracic Surgery (Cardiothoracic Vascular Surgery)

## 2013-11-07 VITALS — BP 140/74 | HR 68 | Resp 16 | Ht 61.5 in | Wt 220.6 lb

## 2013-11-07 DIAGNOSIS — J984 Other disorders of lung: Secondary | ICD-10-CM | POA: Diagnosis not present

## 2013-11-07 DIAGNOSIS — R911 Solitary pulmonary nodule: Secondary | ICD-10-CM

## 2013-11-07 DIAGNOSIS — D381 Neoplasm of uncertain behavior of trachea, bronchus and lung: Secondary | ICD-10-CM | POA: Diagnosis not present

## 2013-11-07 DIAGNOSIS — I2789 Other specified pulmonary heart diseases: Secondary | ICD-10-CM | POA: Diagnosis not present

## 2013-11-07 DIAGNOSIS — Z853 Personal history of malignant neoplasm of breast: Secondary | ICD-10-CM | POA: Diagnosis not present

## 2013-11-07 NOTE — Progress Notes (Signed)
HPI:  Mrs. Santino returns today for followup of her right upper lobe nodule  She is a 78 year old woman with a history of breast cancer. She had a CT of the chest back in 2010 which showed a 4.5 mm nodule in the right upper lobe. She then had another CT in October of 2013 and the nodule had grown to 7.9 mm in diameter. A PET CT was done. The lesion was not hypermetabolic. It also was very well circumscribed. We decided to follow the lesion and had been doing so at six-month intervals are percent.  Since her last visit Mrs. Pal says that she's been doing "okay." She does complain that she fatigues easily. She still has some issues with dizziness. She has not had any unusual cough. She denies hemoptysis. Says that she has been gaining weight. Her exercise is limited by knee pain. She is using a patch for that, but says that it is ineffective.  Past Medical History  Diagnosis Date  . Breast cancer   . Macular degeneration   . Glaucoma   . Sleep apnea   . Cataracts, bilateral   . Nodule of right lung   . Hypertension   . Hyperlipidemia   . Obesity      Current Outpatient Prescriptions  Medication Sig Dispense Refill  . amLODipine (NORVASC) 10 MG tablet TAKE 1 TABLET (10 MG TOTAL) BY MOUTH DAILY.  90 tablet  3  . aspirin 81 MG tablet Take 81 mg by mouth daily.      . Azilsartan Medoxomil (EDARBI) 80 MG TABS Take 80 mg by mouth daily.       . buprenorphine (BUTRANS) 10 MCG/HR PTWK patch Place 10 mcg onto the skin once a week.      . cholecalciferol (VITAMIN D) 1000 UNITS tablet Take 5,000 Units by mouth daily.      . diazepam (VALIUM) 5 MG tablet Take 5 mg by mouth as directed.       . dorzolamide-timolol (COSOPT) 22.3-6.8 MG/ML ophthalmic solution Place 1 drop into both eyes as directed.       . furosemide (LASIX) 40 MG tablet Take 20 mg by mouth daily.       [name] latanoprost (XALATAN) 0.005 % ophthalmic solution Place 1 drop into both eyes at bedtime.      [name] levothyroxine (SYNTHROID,  LEVOTHROID) 25 MCG tablet Take 25 mcg by mouth daily.      . multivitamin-lutein (OCUVITE-LUTEIN) CAPS Take 1 capsule by mouth daily.      [name] NITROSTAT 0.4 MG SL tablet       . NON FORMULARY CPAP machine       . omeprazole (PRILOSEC) 20 MG capsule Take 20 mg by mouth every other day.       . rosuvastatin (CRESTOR) 10 MG tablet Take 10 mg by mouth. Monday and Friday      . atropine 1 % ophthalmic solution       . ZYLET 0.5-0.3 % SUSP        No current facility-administered medications for this visit.    Physical Exam BP 140/74  Pulse 68  Resp 16  Ht 5' 1.5" (1.562 m)  Wt 220 lb 9.6 oz (100.064 kg)  BMI 41.01 kg/m2  SpO2 95% Morbidly obese 78 year old woman in no acute distress Alert and oriented x3 with no focal neurologic deficits No cervical or so clavicular adenopathy Cardiac regular rate and rhythm Lungs diminished breath sounds bilaterally  Diagnostic Tests: CT chest 11/07/2013 CT CHEST  WITHOUT CONTRAST  TECHNIQUE:  Multidetector CT imaging of the chest was performed following the  standard protocol without IV contrast.  COMPARISON: CT CHEST W/O CM dated 03/14/2013; NM PET IMAGE INITIAL  (PI) SKULL BASE TO THIGH dated 07/25/2012; CT CHEST W/O CM dated  07/14/2012; CT CHEST W/O CM dated 05/13/2009  FINDINGS:  There are stable postsurgical changes status post right mastectomy  and axillary node dissection. No pathologically enlarged  mediastinal, hilar, axillary or internal mammary lymph nodes are  identified.  There is stable mild prominence of the thyroid gland. There is  stable diffuse atherosclerosis of the aorta, great vessels and  coronary arteries. Central enlargement of the pulmonary arteries may  be progressive with the main pulmonary artery measuring up to 4.4 cm  on the current examination. There is no significant pleural or  pericardial effusion.  Radiation changes anteriorly in the right upper lobe are again  noted. The well-circumscribed right lower lobe  pulmonary nodule  measures 8 mm on image 28, unchanged from the most recent studies.  As noted previously, this has slowly grown since 2010. No other  nodules are identified. There is no endobronchial lesion or  confluent airspace opacity.  The visualized upper abdomen is grossly stable with low-density  hepatic and renal lesions. There is no adrenal mass. There are no  worrisome osseous findings.  IMPRESSION:  1. 8 mm right lower lobe pulmonary nodule is unchanged from the most  recent study of 2014, although slowly growing from 2010.  2. No new nodules or enlarged lymph nodes identified status post  right mastectomy and axillary node dissection.  3. Central enlargement of the pulmonary arteries consistent with  pulmonary arterial hypertension.  4. Diffuse atherosclerosis.  Electronically Signed  By: Camie Patience M.D.  On: 11/07/2013 14:34  Impression: 78 year old woman with an 8 mm right upper lobe nodule. This lesion grew from 5 to 8 mm between 2010 and 2013. We have been following it with serial CT scans over the past 18 months and there has been no further increase in size. It was not hypermetabolic on PET. It is a very round well-circumscribed nodule. 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  I recommended to her that we continue to follow this nodule. We will plan to repeat a CT in 6 months. She will call if she has any respiratory issues in the meantime.

## 2013-11-29 DIAGNOSIS — R7309 Other abnormal glucose: Secondary | ICD-10-CM | POA: Diagnosis not present

## 2013-11-29 DIAGNOSIS — E785 Hyperlipidemia, unspecified: Secondary | ICD-10-CM | POA: Diagnosis not present

## 2013-11-29 DIAGNOSIS — I10 Essential (primary) hypertension: Secondary | ICD-10-CM | POA: Diagnosis not present

## 2013-11-29 DIAGNOSIS — R21 Rash and other nonspecific skin eruption: Secondary | ICD-10-CM | POA: Diagnosis not present

## 2013-12-18 DIAGNOSIS — I10 Essential (primary) hypertension: Secondary | ICD-10-CM | POA: Diagnosis not present

## 2013-12-18 DIAGNOSIS — R21 Rash and other nonspecific skin eruption: Secondary | ICD-10-CM | POA: Diagnosis not present

## 2013-12-19 DIAGNOSIS — H353190 Nonexudative age-related macular degeneration, unspecified eye, stage unspecified: Secondary | ICD-10-CM | POA: Diagnosis not present

## 2013-12-19 DIAGNOSIS — H4011X0 Primary open-angle glaucoma, stage unspecified: Secondary | ICD-10-CM | POA: Diagnosis not present

## 2013-12-19 DIAGNOSIS — Z961 Presence of intraocular lens: Secondary | ICD-10-CM | POA: Diagnosis not present

## 2013-12-29 DIAGNOSIS — R894 Abnormal immunological findings in specimens from other organs, systems and tissues: Secondary | ICD-10-CM | POA: Diagnosis not present

## 2014-02-20 DIAGNOSIS — R21 Rash and other nonspecific skin eruption: Secondary | ICD-10-CM | POA: Diagnosis not present

## 2014-02-20 DIAGNOSIS — R894 Abnormal immunological findings in specimens from other organs, systems and tissues: Secondary | ICD-10-CM | POA: Diagnosis not present

## 2014-02-23 DIAGNOSIS — R21 Rash and other nonspecific skin eruption: Secondary | ICD-10-CM | POA: Diagnosis not present

## 2014-04-03 ENCOUNTER — Telehealth: Payer: Self-pay | Admitting: Internal Medicine

## 2014-04-03 NOTE — Telephone Encounter (Signed)
s.w. pt and r/s 7.6 appt to 7.24 due to MD on call...pt ok adn aware of new d.t

## 2014-04-09 ENCOUNTER — Other Ambulatory Visit: Payer: Medicare Other

## 2014-04-09 ENCOUNTER — Ambulatory Visit: Payer: Medicare Other

## 2014-04-23 ENCOUNTER — Other Ambulatory Visit: Payer: Self-pay

## 2014-04-23 DIAGNOSIS — D381 Neoplasm of uncertain behavior of trachea, bronchus and lung: Secondary | ICD-10-CM

## 2014-04-24 DIAGNOSIS — H4011X0 Primary open-angle glaucoma, stage unspecified: Secondary | ICD-10-CM | POA: Diagnosis not present

## 2014-04-24 DIAGNOSIS — Z961 Presence of intraocular lens: Secondary | ICD-10-CM | POA: Diagnosis not present

## 2014-04-24 DIAGNOSIS — H353190 Nonexudative age-related macular degeneration, unspecified eye, stage unspecified: Secondary | ICD-10-CM | POA: Diagnosis not present

## 2014-04-27 ENCOUNTER — Telehealth: Payer: Self-pay | Admitting: Internal Medicine

## 2014-04-27 ENCOUNTER — Ambulatory Visit (HOSPITAL_BASED_OUTPATIENT_CLINIC_OR_DEPARTMENT_OTHER): Payer: Medicare Other | Admitting: Internal Medicine

## 2014-04-27 ENCOUNTER — Other Ambulatory Visit (HOSPITAL_BASED_OUTPATIENT_CLINIC_OR_DEPARTMENT_OTHER): Payer: Medicare Other

## 2014-04-27 VITALS — BP 161/82 | HR 64 | Temp 98.0°F | Resp 19 | Ht 61.0 in | Wt 216.1 lb

## 2014-04-27 DIAGNOSIS — Z853 Personal history of malignant neoplasm of breast: Secondary | ICD-10-CM

## 2014-04-27 DIAGNOSIS — C50911 Malignant neoplasm of unspecified site of right female breast: Secondary | ICD-10-CM

## 2014-04-27 DIAGNOSIS — R911 Solitary pulmonary nodule: Secondary | ICD-10-CM | POA: Diagnosis not present

## 2014-04-27 LAB — COMPREHENSIVE METABOLIC PANEL (CC13)
ALT: 17 U/L (ref 0–55)
AST: 24 U/L (ref 5–34)
Albumin: 3.6 g/dL (ref 3.5–5.0)
Alkaline Phosphatase: 80 U/L (ref 40–150)
Anion Gap: 10 mEq/L (ref 3–11)
BILIRUBIN TOTAL: 0.41 mg/dL (ref 0.20–1.20)
BUN: 19.6 mg/dL (ref 7.0–26.0)
CALCIUM: 9.9 mg/dL (ref 8.4–10.4)
CHLORIDE: 108 meq/L (ref 98–109)
CO2: 24 mEq/L (ref 22–29)
CREATININE: 1.2 mg/dL — AB (ref 0.6–1.1)
Glucose: 123 mg/dl (ref 70–140)
Potassium: 3.9 mEq/L (ref 3.5–5.1)
Sodium: 142 mEq/L (ref 136–145)
Total Protein: 7 g/dL (ref 6.4–8.3)

## 2014-04-27 LAB — CBC WITH DIFFERENTIAL/PLATELET
BASO%: 0.7 % (ref 0.0–2.0)
Basophils Absolute: 0.1 10*3/uL (ref 0.0–0.1)
EOS%: 2.2 % (ref 0.0–7.0)
Eosinophils Absolute: 0.2 10*3/uL (ref 0.0–0.5)
HEMATOCRIT: 37 % (ref 34.8–46.6)
HEMOGLOBIN: 12 g/dL (ref 11.6–15.9)
LYMPH#: 1.8 10*3/uL (ref 0.9–3.3)
LYMPH%: 25 % (ref 14.0–49.7)
MCH: 32.8 pg (ref 25.1–34.0)
MCHC: 32.5 g/dL (ref 31.5–36.0)
MCV: 100.7 fL (ref 79.5–101.0)
MONO#: 0.7 10*3/uL (ref 0.1–0.9)
MONO%: 9.2 % (ref 0.0–14.0)
NEUT#: 4.5 10*3/uL (ref 1.5–6.5)
NEUT%: 62.9 % (ref 38.4–76.8)
Platelets: 276 10*3/uL (ref 145–400)
RBC: 3.67 10*6/uL — ABNORMAL LOW (ref 3.70–5.45)
RDW: 13.2 % (ref 11.2–14.5)
WBC: 7.2 10*3/uL (ref 3.9–10.3)

## 2014-04-27 NOTE — Telephone Encounter (Signed)
gv adn rpinted appt sched and avs for pt for July 2016

## 2014-04-27 NOTE — Progress Notes (Signed)
[facility] OFFICE PROGRESS NOTE  [name], MD [address] [place] [address]  DIAGNOSIS: Breast cancer, right breast - Plan: CBC with Differential, Basic metabolic panel (Bmet) - [facility], Lactate dehydrogenase (LDH) - [facility]  Chief Complaint  Patient presents with  . Breast cancer, right breast    CURRENT THERAPY: Observation.   INTERVAL HISTORY: BRENETTA PENNY 78 y.o. female with a problem list consisting of:   1. Poorly differentiated grade 3 carcinoma of the right breast with 4/22 positive lymph nodes, extracapsular spread and positive hormone receptors, stage IIB, T2 N1 dating back to April 2001. Estrogen receptor was 89%, progesterone receptor 10% and HER-2/neu was negative. The patient underwent a right modified radical mastectomy on 01/27/2000 followed by adjuvant chemotherapy consisting of Cytoxan, Adriamycin and Taxol from 06/23/2000 through 11/2000. She then received radiation to the right chest wall through 01/2001 and then was on adjuvant Arimidex from 11/2000 through 12/2005. The patient has remained disease free.  2. Hypertension  3. Goiter  4. Dyslipidemia  5. Osteopenia  6. Macular degeneration  7. Possible TIA in early 08/2000.  8. Hemangioma of the right hepatic lobe noted 02/2000.  9. Right lower lobe lung nodule noted 10/20/2007. This lesion was stable measuring 5 mm, up to a chest x-ray carried out on 06/03/2010. Recent imaging studies include a chest x-ray carried out on 07/11/2012, a CT scan of the chest on 07/14/2012, and a PET scan from 07/25/2012. These imaging studies show that this lesion has increased, now measures 8 mm. PET scan showed no hypermetabolic activity associated with the right lower lobe lung nodule. The patient was seen by Dr. Modesto Charon on 03/14/2013.   I saw Mrs. Damita Dunnings today for a follow-up visit of her history of cancer to the right breast dating back to April 2001 and mildly increased right lower  lobe nodule to 8 mm. The patient has remained disease free and her condition remains relatively unchanged. She reports having an adverse reaction to one of her blood pressure medications.  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  She was treated with a shot of steroids and cream.  Her facial "butterfly" rash is now resolving.  She is not symptomatic and denies any symptomatology today including SOB, pain, chest pain, fever, chills, N/V/D or constipation, unusual bleeding and any breast changes.    MEDICAL HISTORY: Past Medical History  Diagnosis Date  . Breast cancer   . Macular degeneration   . Glaucoma   . Sleep apnea   . Cataracts, bilateral   . Nodule of right lung   . Hypertension   . Hyperlipidemia   . Obesity     INTERIM HISTORY: has HYPOTHYROIDISM-IATROGENIC; HYPERTENSION, BENIGN; Dizziness; Chest pain; Palpitations; Bradycardia; Carotid stenosis; Breast cancer, right breast; and Lung nodule, solitary on her problem list.    ALLERGIES:  is allergic to belviq; morphine and related; and latex.  MEDICATIONS: has a current medication list which includes the following prescription(s): amlodipine, aspirin, atropine, cholecalciferol, diazepam, dorzolamide-timolol, furosemide, hydrocodone-acetaminophen, latanoprost, levothyroxine, metronidazole, multivitamin-lutein, nitrostat, NON FORMULARY, olmesartan medoxomil, omeprazole, and rosuvastatin.  SURGICAL HISTORY:  Past Surgical History  Procedure Laterality Date  . Cesarean section    . Cataract extraction, bilateral    . Total knee arthroplasty      x2    REVIEW OF SYSTEMS:   Constitutional: Denies fevers, chills or abnormal weight loss Eyes: Denies blurriness of vision Ears, nose, mouth, throat, and face: Denies mucositis or sore throat Respiratory:  Denies cough, dyspnea or wheezes Cardiovascular: Denies palpitation, chest discomfort or lower extremity swelling Gastrointestinal:  Denies nausea, heartburn or change in bowel  habits Skin: Denies abnormal skin rashes Lymphatics: Denies new lymphadenopathy or easy bruising Neurological:Denies numbness, tingling or new weaknesses Behavioral/Psych: Mood is stable, no new changes  All other systems were reviewed with the patient and are negative.  PHYSICAL EXAMINATION: ECOG PERFORMANCE STATUS: 0 - Asymptomatic  Blood pressure 161/82, pulse 64, temperature 98 F (36.7 C), temperature source Oral, resp. rate 19, height '5\' 1"'  (1.549 m), weight 216 lb 1.6 oz (98.022 kg), SpO2 100.00%.  GENERAL:alert, no distress and comfortable SKIN: skin color, texture, turgor are normal, no rashes or significant lesions EYES: normal, Conjunctiva are pink and non-injected, sclera clear OROPHARYNX:no exudate, no erythema and lips, buccal mucosa, and tongue normal  NECK: supple, thyroid normal size, non-tender, without nodularity LYMPH:  no palpable lymphadenopathy in the cervical, axillary or supraclavicular LUNGS: clear to auscultation and percussion with normal breathing effort HEART: regular rate & rhythm and SEM 1/6 and no lower extremity edema Breasts: Right breast is surgically absent with no evidence for chest wall recurrence. Left breast is without evidence of cancer. No suspicious findings. No axillary adenopathy. ABDOMEN:abdomen soft, non-tender and normal bowel sounds Musculoskeletal:no cyanosis of digits and no clubbing  NEURO: alert & oriented x 3 with fluent speech, no focal motor/sensory deficits  Labs:  Lab Results  Component Value Date   WBC 7.2 04/27/2014   HGB 12.0 04/27/2014   HCT 37.0 04/27/2014   MCV 100.7 04/27/2014   PLT 276 04/27/2014   NEUTROABS 4.5 04/27/2014      Chemistry      Component Value Date/Time   NA 142 04/27/2014 1444   NA 141 06/25/2011 1320   K 3.9 04/27/2014 1444   K 4.0 06/25/2011 1320   CL 108* 07/11/2012 1400   CL 106 06/25/2011 1320   CO2 24 04/27/2014 1444   CO2 25 06/25/2011 1320   BUN 19.6 04/27/2014 1444   BUN 13 06/25/2011 1320    CREATININE 1.2* 04/27/2014 1444   CREATININE 0.96 06/25/2011 1320      Component Value Date/Time   CALCIUM 9.9 04/27/2014 1444   CALCIUM 9.2 06/25/2011 1320   ALKPHOS 80 04/27/2014 1444   ALKPHOS 80 06/25/2011 1320   AST 24 04/27/2014 1444   AST 21 06/25/2011 1320   ALT 17 04/27/2014 1444   ALT 10 06/25/2011 1320   BILITOT 0.41 04/27/2014 1444   BILITOT 0.5 06/25/2011 1320      Studies:  No results found.   RADIOGRAPHIC STUDIES: 1. Chest CT scan without IV contrast from 05/13/2009 showed a 4 mm well differentiated right lower lobe nodule which is felt to be  stable.  2. Chest x-ray, 2 view, from 06/03/2010 showed a 5 mm right lower lobe nodule which is stable. No other nodules were present.  3. Digital left screening mammogram on 07/13/2011 was negative.  4. Thyroid ultrasound from 05/30/2012 showed thyromegaly. There were small left lobe and isthmic nodules. Findings did not meet current SRU consensus criteria for biopsy.  5. Chest x-ray, 2 view, on 07/11/2012 showed that the right lower lobe pulmonary nodule measured 8 mm, as compared with 5 mm on the prior chest x-ray from 06/03/2010 and the chest CT scan from 05/13/2009.  6. CT of the chest without IV contrast on 07/14/2012 showed that the right lower lobe pulmonary nodule measured 8 mm, was felt to be slightly increased compared to the  prior CT scan of 3 years ago. In the differential is a carcinoid tumor. The possibility of a lung metastasis from breast cancer was also raised.  7. PET scan from 07/25/2012 showed no hypermetabolic activity associated with a right lower lobe pulmonary nodule, which measured  9 mm on image #90. There was diffuse metabolic activity within the thyroid gland, most consistent with thyroiditis. There were no  hypermetabolic mediastinal lymph nodes or any other suspicious lesions.  8. Mammogram of the left breast on 07/26/2012 was negative.  9. Mammogram of the left breast on 10/02/2013 was negative. 10. CT chest  w/o contrast consistent with 1. 8 mm right lower lobe pulmonary nodule is unchanged from the most  recent study of 2014, although slowly growing from 2010.  2. No new nodules or enlarged lymph nodes identified status post right mastectomy and axillary node dissection.    ASSESSMENT: ALEXIS REBER 78 y.o. female with a history of Breast cancer, right breast - Plan: CBC with Differential, Basic metabolic panel (Bmet) - [facility], Lactate dehydrogenase (LDH) - [facility]   PLAN:   1. Breast Cancer. --Mrs. Prospero continues to do well with no signs of recurrent breast cancer, now almost 13 years from the time of diagnosis. She said she had a mammogram in 09/2013 as reported above and was negative.   2. Solitary Lung nodule.  --She has not experienced any symptoms related to the lung nodule. Mrs. Deyo is was seen by Dr. Roxan Hockey and had a repeat CT of the chest was stable.   3. Follow-up. --We plan to see Mrs. Herdt again in approximately 12 months on at which time we will check a CBC, CMP and LDH.  All questions were answered. The patient knows to call the clinic with any problems, questions or concerns. We can certainly see the patient much sooner if necessary.  I spent 10 minutes counseling the patient face to face. The total time spent in the appointment was 15 minutes.    Arlayne Liggins, MD 04/28/2014 12:49 PM

## 2014-04-30 ENCOUNTER — Other Ambulatory Visit: Payer: Self-pay | Admitting: *Deleted

## 2014-04-30 ENCOUNTER — Other Ambulatory Visit: Payer: Self-pay | Admitting: Internal Medicine

## 2014-04-30 DIAGNOSIS — E032 Hypothyroidism due to medicaments and other exogenous substances: Secondary | ICD-10-CM

## 2014-04-30 DIAGNOSIS — C50911 Malignant neoplasm of unspecified site of right female breast: Secondary | ICD-10-CM

## 2014-04-30 LAB — TSH CHCC: TSH: 3.105 m[IU]/L (ref 0.308–3.960)

## 2014-05-22 ENCOUNTER — Ambulatory Visit (INDEPENDENT_AMBULATORY_CARE_PROVIDER_SITE_OTHER): Payer: Medicare Other | Admitting: Thoracic Surgery (Cardiothoracic Vascular Surgery)

## 2014-05-22 ENCOUNTER — Ambulatory Visit
Admission: RE | Admit: 2014-05-22 | Discharge: 2014-05-22 | Disposition: A | Discharge status: Home / Self Care | Payer: Medicare Other | Source: Ambulatory Visit | Attending: Thoracic Surgery (Cardiothoracic Vascular Surgery) | Admitting: Thoracic Surgery (Cardiothoracic Vascular Surgery)

## 2014-05-22 ENCOUNTER — Encounter: Payer: Self-pay | Admitting: Thoracic Surgery (Cardiothoracic Vascular Surgery)

## 2014-05-22 VITALS — BP 132/70 | HR 64 | Resp 20 | Ht 61.0 in | Wt 216.0 lb

## 2014-05-22 DIAGNOSIS — J984 Other disorders of lung: Secondary | ICD-10-CM | POA: Diagnosis not present

## 2014-05-22 DIAGNOSIS — D381 Neoplasm of uncertain behavior of trachea, bronchus and lung: Secondary | ICD-10-CM

## 2014-05-22 DIAGNOSIS — R911 Solitary pulmonary nodule: Secondary | ICD-10-CM

## 2014-05-22 NOTE — Progress Notes (Signed)
HPI:  Mrs. Stoffel turns today for a 6 month followup visit.  She is a 78 year old woman who we have followed since 2013 for a right lower lobe nodule. This was found incidentally on CT scan. It was negative on PET and is very well circumscribed with a benign appearance, so we elected to follow it radiologically. She was last seen in the office in February of 2015 nodules stable at that time.  Since her last visit she's been having some difficulty related to medications. 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 She is being followed by Dr. Maudie Mercury for that.  She has not had any problems with her breathing. She denies any unusual cough or hemoptysis.  Past Medical History  Diagnosis Date  . Breast cancer   . Macular degeneration   . Glaucoma   . Sleep apnea   . Cataracts, bilateral   . Nodule of right lung   . Hypertension   . Hyperlipidemia   . Obesity   '  Current Outpatient Prescriptions  Medication Sig Dispense Refill  . amLODipine (NORVASC) 10 MG tablet TAKE 1 TABLET (10 MG TOTAL) BY MOUTH DAILY.  90 tablet  3  . aspirin 81 MG tablet Take 81 mg by mouth daily.      [name] atropine 1 % ophthalmic solution       . cholecalciferol (VITAMIN D) 1000 UNITS tablet Take 5,000 Units by mouth daily.      . diazepam (VALIUM) 5 MG tablet Take 5 mg by mouth as directed.       . dorzolamide-timolol (COSOPT) 22.3-6.8 MG/ML ophthalmic solution Place 1 drop into both eyes as directed.       . furosemide (LASIX) 40 MG tablet Take 20 mg by mouth daily.       [name] HYDROcodone-acetaminophen (NORCO) 7.5-325 MG per tablet Take 1 tablet by mouth at bedtime as needed.      . latanoprost (XALATAN) 0.005 % ophthalmic solution Place 1 drop into both eyes at bedtime.      [name] levothyroxine (SYNTHROID, LEVOTHROID) 25 MCG tablet Take 25 mcg by mouth daily.      . metroNIDAZOLE (METROCREAM) 0.75 % cream Apply topically. Under breasts prn      . multivitamin-lutein (OCUVITE-LUTEIN) CAPS  Take 1 capsule by mouth daily.      [name] NITROSTAT 0.4 MG SL tablet Place 0.4 mg under the tongue every 5 (five) minutes as needed.       . NON FORMULARY CPAP machine       . Olmesartan Medoxomil (BENICAR PO) Take 10 mg by mouth daily.      [name] omeprazole (PRILOSEC) 20 MG capsule Take 20 mg by mouth every other day.       . rosuvastatin (CRESTOR) 10 MG tablet Take 10 mg by mouth. Monday and Friday      . clotrimazole-betamethasone (LOTRISONE) cream        No current facility-administered medications for this visit.    Physical Exam BP 132/70  Pulse 64  Resp 20  Ht 5\' 1"  (1.549 m)  Wt 216 lb (97.977 kg)  BMI 40.83 kg/m2  SpO2 73% Obese 78 year old woman in no acute distress Alert and oriented x3 with no focal neurologic deficits Lungs clear with equal breath sounds bilaterally Cardiac regular rate and rhythm normal S1 and S2  Diagnostic Tests: CT CHEST WITHOUT CONTRAST  TECHNIQUE:  Multidetector CT imaging of the chest was performed following the  standard protocol without IV  contrast.  COMPARISON: Multiple chest CTs. The most recent 11/07/2013. CTs  dating back to 05/13/2009.  FINDINGS:  Changes of right mastectomy. Radiation changes noted in the anterior  right lung, stable.  Right lower lobe nodule on image 30 measures up to 8 mm. This is  unchanged since most recent study but has slowly enlarged since  2010.  No new pulmonary nodules. No pleural effusions. No confluent  airspace opacities in the left lung.  Heart is enlarged. Coronary artery and aortic calcifications.  Ascending aorta is ectatic, measuring maximally 3.8 cm, stable. No  mediastinal, hilar, or axillary adenopathy. Chest wall soft tissues  are unremarkable.  Imaging in the upper abdomen again demonstrates hypodensities  scattered throughout the liver, unchanged. No adrenal masses. No  acute findings in the upper abdomen. Prior cholecystectomy.  No acute bony abnormality or focal bone lesion. Degenerative  changes  and scoliosis in the thoracic spine.  IMPRESSION:  Changes of right mastectomy. Postradiation changes/ fibrosis in the  anterior right lung.  8 mm right lower lobe pulmonary nodule, stable since 07/14/2012.  Electronically Signed  By: Rolm Baptise M.D.  On: 05/22/2014 13:56   Impression: 78 year old woman with an 8 mm right lower lobe nodule. This is not changed significantly over the past 2 years, but has increased in size is 2010. It most likely is benign. I do think that we should keep an on this nodule and recommended that we repeat a CT scan in one year, as I have seen nodules that progressed after more than 2 years of stability.  Plan: Return in one year with CT of chest

## 2014-05-25 ENCOUNTER — Other Ambulatory Visit: Payer: Self-pay | Admitting: Internal Medicine

## 2014-06-27 DIAGNOSIS — Z23 Encounter for immunization: Secondary | ICD-10-CM | POA: Diagnosis not present

## 2014-08-08 ENCOUNTER — Telehealth: Payer: Self-pay | Admitting: Hematology

## 2014-08-08 NOTE — Telephone Encounter (Signed)
S/w pt to advise of appt time chg on 04/26/15 from 1pm to 2pm due to md meeting. Pt verbalized understanding.

## 2014-08-28 DIAGNOSIS — H4011X2 Primary open-angle glaucoma, moderate stage: Secondary | ICD-10-CM | POA: Diagnosis not present

## 2014-08-28 DIAGNOSIS — H3531 Nonexudative age-related macular degeneration: Secondary | ICD-10-CM | POA: Diagnosis not present

## 2014-08-28 DIAGNOSIS — Z961 Presence of intraocular lens: Secondary | ICD-10-CM | POA: Diagnosis not present

## 2014-09-03 ENCOUNTER — Other Ambulatory Visit: Payer: Self-pay

## 2014-09-03 DIAGNOSIS — Z1231 Encounter for screening mammogram for malignant neoplasm of breast: Secondary | ICD-10-CM

## 2014-09-04 DIAGNOSIS — H4011X2 Primary open-angle glaucoma, moderate stage: Secondary | ICD-10-CM | POA: Diagnosis not present

## 2014-09-12 DIAGNOSIS — E559 Vitamin D deficiency, unspecified: Secondary | ICD-10-CM | POA: Diagnosis not present

## 2014-09-12 DIAGNOSIS — H4011X0 Primary open-angle glaucoma, stage unspecified: Secondary | ICD-10-CM | POA: Diagnosis not present

## 2014-09-12 DIAGNOSIS — E039 Hypothyroidism, unspecified: Secondary | ICD-10-CM | POA: Diagnosis not present

## 2014-09-12 DIAGNOSIS — I10 Essential (primary) hypertension: Secondary | ICD-10-CM | POA: Diagnosis not present

## 2014-09-17 DIAGNOSIS — I10 Essential (primary) hypertension: Secondary | ICD-10-CM | POA: Diagnosis not present

## 2014-09-17 DIAGNOSIS — E785 Hyperlipidemia, unspecified: Secondary | ICD-10-CM | POA: Diagnosis not present

## 2014-09-17 DIAGNOSIS — E039 Hypothyroidism, unspecified: Secondary | ICD-10-CM | POA: Diagnosis not present

## 2014-10-03 ENCOUNTER — Ambulatory Visit
Admission: RE | Admit: 2014-10-03 | Discharge: 2014-10-03 | Disposition: A | Discharge status: Home / Self Care | Payer: Medicare Other | Source: Ambulatory Visit

## 2014-10-03 DIAGNOSIS — Z1231 Encounter for screening mammogram for malignant neoplasm of breast: Secondary | ICD-10-CM

## 2014-11-19 ENCOUNTER — Other Ambulatory Visit: Payer: Self-pay | Admitting: Internal Medicine

## 2015-03-11 ENCOUNTER — Other Ambulatory Visit: Payer: Self-pay | Admitting: Physician Assistant

## 2015-04-18 ENCOUNTER — Other Ambulatory Visit: Payer: Self-pay | Admitting: Thoracic Surgery (Cardiothoracic Vascular Surgery)

## 2015-04-18 DIAGNOSIS — R911 Solitary pulmonary nodule: Secondary | ICD-10-CM

## 2015-04-19 ENCOUNTER — Other Ambulatory Visit: Payer: Self-pay | Admitting: *Deleted

## 2015-04-19 DIAGNOSIS — C50911 Malignant neoplasm of unspecified site of right female breast: Secondary | ICD-10-CM

## 2015-04-21 NOTE — Assessment & Plan Note (Signed)
Rt Mastectomy 2002:[facility] 4/22 positive lymph nodes, extracapsular spread  stage IIB, T2 N1  ER 89%, Pr 10% and HER-2/neu was negative S/P XRT and 5 years of Anti-estrogen therapy.  Breast Cancer Surveillance: 1. Breast exam 04/21/15: Normal 2. Mammogram 10/03/14 No abnormalities. Postsurgical changes. Breast Density Category B. I recommended that she get 3-D mammograms for surveillance. Discussed the differences between different breast density categories.

## 2015-04-22 ENCOUNTER — Other Ambulatory Visit (HOSPITAL_BASED_OUTPATIENT_CLINIC_OR_DEPARTMENT_OTHER): Payer: Medicare Other

## 2015-04-22 ENCOUNTER — Ambulatory Visit (HOSPITAL_BASED_OUTPATIENT_CLINIC_OR_DEPARTMENT_OTHER): Payer: Medicare Other | Admitting: Hematology and Oncology

## 2015-04-22 ENCOUNTER — Encounter: Payer: Self-pay | Admitting: Hematology and Oncology

## 2015-04-22 VITALS — BP 135/83 | HR 68 | Temp 98.2°F | Resp 18 | Ht 61.0 in | Wt 225.8 lb

## 2015-04-22 DIAGNOSIS — C50911 Malignant neoplasm of unspecified site of right female breast: Secondary | ICD-10-CM

## 2015-04-22 DIAGNOSIS — Z853 Personal history of malignant neoplasm of breast: Secondary | ICD-10-CM | POA: Diagnosis not present

## 2015-04-22 LAB — COMPREHENSIVE METABOLIC PANEL (CC13)
ALT: 16 U/L (ref 0–55)
ANION GAP: 7 meq/L (ref 3–11)
AST: 23 U/L (ref 5–34)
Albumin: 3.5 g/dL (ref 3.5–5.0)
Alkaline Phosphatase: 86 U/L (ref 40–150)
BUN: 22.4 mg/dL (ref 7.0–26.0)
CO2: 26 mEq/L (ref 22–29)
Calcium: 10.4 mg/dL (ref 8.4–10.4)
Chloride: 110 mEq/L — ABNORMAL HIGH (ref 98–109)
Creatinine: 1 mg/dL (ref 0.6–1.1)
EGFR: 52 mL/min/{1.73_m2} — ABNORMAL LOW (ref >90)
Glucose: 92 mg/dl (ref 70–140)
POTASSIUM: 3.9 meq/L (ref 3.5–5.1)
Sodium: 144 mEq/L (ref 136–145)
TOTAL PROTEIN: 6.8 g/dL (ref 6.4–8.3)
Total Bilirubin: 0.42 mg/dL (ref 0.20–1.20)

## 2015-04-22 LAB — CBC WITH DIFFERENTIAL/PLATELET
BASO%: 0.6 % (ref 0.0–2.0)
BASOS ABS: 0 10*3/uL (ref 0.0–0.1)
EOS%: 1.7 % (ref 0.0–7.0)
Eosinophils Absolute: 0.1 10*3/uL (ref 0.0–0.5)
HCT: 38.6 % (ref 34.8–46.6)
HGB: 12.8 g/dL (ref 11.6–15.9)
LYMPH#: 1.7 10*3/uL (ref 0.9–3.3)
LYMPH%: 23.1 % (ref 14.0–49.7)
MCH: 32.5 pg (ref 25.1–34.0)
MCHC: 33.2 g/dL (ref 31.5–36.0)
MCV: 97.9 fL (ref 79.5–101.0)
MONO#: 0.8 10*3/uL (ref 0.1–0.9)
MONO%: 10.5 % (ref 0.0–14.0)
NEUT#: 4.7 10*3/uL (ref 1.5–6.5)
NEUT%: 64.1 % (ref 38.4–76.8)
Platelets: 240 10*3/uL (ref 145–400)
RBC: 3.94 10*6/uL (ref 3.70–5.45)
RDW: 13.6 % (ref 11.2–14.5)
WBC: 7.3 10*3/uL (ref 3.9–10.3)

## 2015-04-22 LAB — LACTATE DEHYDROGENASE (CC13): LDH: 200 U/L (ref 125–245)

## 2015-04-22 NOTE — Progress Notes (Signed)
Patient Care Team: [name], MD as PCP - General (Internal Medicine)  DIAGNOSIS: No matching staging information was found for the patient.  SUMMARY OF ONCOLOGIC HISTORY:   Breast cancer, right breast   01/27/2000 Surgery Rt Mastectomy:4/22 positive lymph nodes, extracapsular spread  stage IIB, T2 N1  ER 89%, Pr 10% and HER-2/neu was negative   06/23/2000 - 11/15/2000 Chemotherapy AC foll by Taxol   11/22/2000 - 12/19/2005 Anti-estrogen oral therapy Arimidex 75m daily   12/13/2000 - 01/19/2001 Radiation Therapy Adj XRT   10/20/2007 Imaging Right lower lobe lung nodule 5 mm at diagnosis and inc to 8 mm foll by Dr.Hendrickson    CHIEF COMPLIANT: Follow-up of right breast cancer  INTERVAL HISTORY: Amy JASSOis a 79year old with above-mentioned history right breast cancer treated with mastectomy followed by adjuvant chemotherapy and radiation. She is here for annual follow-up. She reports no new problems or concerns. She had a right lower lobe nodule which is being followed by Dr. HRoxan Hockey She has had annual mammograms on left breast in December which have been normal.  REVIEW OF SYSTEMS:   Constitutional: Denies fevers, chills or abnormal weight loss Eyes: Denies blurriness of vision Ears, nose, mouth, throat, and face: Denies mucositis or sore throat Respiratory: Denies cough, dyspnea or wheezes Cardiovascular: Denies palpitation, chest discomfort or lower extremity swelling Gastrointestinal:  Denies nausea, heartburn or change in bowel habits Skin: Denies abnormal skin rashes Lymphatics: Denies new lymphadenopathy or easy bruising Neurological:Denies numbness, tingling or new weaknesses Behavioral/Psych: Mood is stable, no new changes  Breast:  denies any pain or lumps or nodules in either breasts All other systems were reviewed with the patient and are negative.  I have reviewed the past medical history, past surgical history, social history and family history with the patient and  they are unchanged from previous note.  ALLERGIES:  is allergic to belviq; edarbi; morphine and related; and latex.  MEDICATIONS:  Current Outpatient Prescriptions  Medication Sig Dispense Refill  . amLODipine (NORVASC) 10 MG tablet TAKE 1 TABLET (10 MG TOTAL) BY MOUTH DAILY. 90 tablet 2  . aspirin 81 MG tablet Take 81 mg by mouth daily.    .Marland Kitchenatropine 1 % ophthalmic solution     . BENICAR 20 MG tablet     . brimonidine (ALPHAGAN) 0.2 % ophthalmic solution     . cholecalciferol (VITAMIN D) 1000 UNITS tablet Take 5,000 Units by mouth daily.    . clotrimazole-betamethasone (LOTRISONE) cream     . diazepam (VALIUM) 5 MG tablet Take 5 mg by mouth as directed.     . dorzolamide-timolol (COSOPT) 22.3-6.8 MG/ML ophthalmic solution Place 1 drop into both eyes as directed.     . fluticasone (CUTIVATE) 0.05 % cream     . furosemide (LASIX) 40 MG tablet Take 20 mg by mouth daily.     .Marland KitchenHYDROcodone-acetaminophen (NORCO) 7.5-325 MG per tablet Take 1 tablet by mouth at bedtime as needed.    .Marland KitchenHYDROcodone-acetaminophen (NORCO/VICODIN) 5-325 MG per tablet     . ketoconazole (NIZORAL) 2 % cream     . latanoprost (XALATAN) 0.005 % ophthalmic solution Place 1 drop into both eyes at bedtime.    .Marland Kitchenlevothyroxine (SYNTHROID, LEVOTHROID) 25 MCG tablet Take 25 mcg by mouth daily.    . metroNIDAZOLE (METROCREAM) 0.75 % cream Apply topically. Under breasts prn    . multivitamin-lutein (OCUVITE-LUTEIN) CAPS Take 1 capsule by mouth daily.    .Marland KitchenNITROSTAT 0.4 MG SL tablet Place 0.4 mg  under the tongue every 5 (five) minutes as needed.     . NON FORMULARY CPAP machine     . Olmesartan Medoxomil (BENICAR PO) Take 10 mg by mouth daily.    [name] omeprazole (PRILOSEC) 20 MG capsule Take 20 mg by mouth every other day.     . rosuvastatin (CRESTOR) 10 MG tablet Take 10 mg by mouth. Monday and Friday     No current facility-administered medications for this visit.    PHYSICAL EXAMINATION: ECOG PERFORMANCE STATUS: 1 -  Symptomatic but completely ambulatory  Filed Vitals:   04/22/15 1409  BP: 135/83  Pulse: 68  Temp: 98.2 F (36.8 C)  Resp: 18   Filed Weights   04/22/15 1409  Weight: 225 lb 12.8 oz (102.422 kg)    GENERAL:alert, no distress and comfortable SKIN: skin color, texture, turgor are normal, no rashes or significant lesions EYES: normal, Conjunctiva are pink and non-injected, sclera clear OROPHARYNX:no exudate, no erythema and lips, buccal mucosa, and tongue normal  NECK: supple, thyroid normal size, non-tender, without nodularity LYMPH:  no palpable lymphadenopathy in the cervical, axillary or inguinal LUNGS: clear to auscultation and percussion with normal breathing effort HEART: regular rate & rhythm and no murmurs and no lower extremity edema ABDOMEN:abdomen soft, non-tender and normal bowel sounds Musculoskeletal:no cyanosis of digits and no clubbing  NEURO: alert & oriented x 3 with fluent speech, no focal motor/sensory deficits BREAST: No palpable nodules in the left breast or axilla. Right chest wall or axillary without any lumps or nodules.. (exam performed in the presence of a chaperone)  LABORATORY DATA:  I have reviewed the data as listed   Chemistry      Component Value Date/Time   NA 142 04/27/2014 1444   NA 141 06/25/2011 1320   K 3.9 04/27/2014 1444   K 4.0 06/25/2011 1320   CL 108* 07/11/2012 1400   CL 106 06/25/2011 1320   CO2 24 04/27/2014 1444   CO2 25 06/25/2011 1320   BUN 19.6 04/27/2014 1444   BUN 13 06/25/2011 1320   CREATININE 1.2* 04/27/2014 1444   CREATININE 0.96 06/25/2011 1320      Component Value Date/Time   CALCIUM 9.9 04/27/2014 1444   CALCIUM 9.2 06/25/2011 1320   ALKPHOS 80 04/27/2014 1444   ALKPHOS 80 06/25/2011 1320   AST 24 04/27/2014 1444   AST 21 06/25/2011 1320   ALT 17 04/27/2014 1444   ALT 10 06/25/2011 1320   BILITOT 0.41 04/27/2014 1444   BILITOT 0.5 06/25/2011 1320       Lab Results  Component Value Date   WBC 7.3  04/22/2015   HGB 12.8 04/22/2015   HCT 38.6 04/22/2015   MCV 97.9 04/22/2015   PLT 240 04/22/2015   NEUTROABS 4.7 04/22/2015     RADIOGRAPHIC STUDIES: I have personally reviewed the radiology reports and agreed with their findings. Mammogram December 2015 is normal   ASSESSMENT & PLAN:  Breast cancer, right breast Rt Mastectomy 2002:[facility] 4/22 positive lymph nodes, extracapsular spread  stage IIB, T2 N1  ER 89%, Pr 10% and HER-2/neu was negative S/P XRT and 5 years of Anti-estrogen therapy.  Breast Cancer Surveillance: 1. Breast exam 04/21/15: Normal 2. Mammogram 10/03/14 No abnormalities. Postsurgical changes. Breast Density Category B. I recommended that she get 3-D mammograms for surveillance. Discussed the differences between different breast density categories.  Patient would like to follow with her primary care physician since she is 15 years from diagnosis. I believe it is perfectly  reasonable and I congratulated her graduating from the breast clinic. If she has any problems or concerns I instructed her to call us and we can see her immediately.   No orders of the defined types were placed in this encounter.   The patient has a good understanding of the overall plan. she agrees with it. she will call with any problems that may develop before the next visit here.   Rulon Eisenmenger, MD

## 2015-04-26 ENCOUNTER — Other Ambulatory Visit: Payer: Medicare Other

## 2015-04-26 ENCOUNTER — Ambulatory Visit: Payer: Medicare Other

## 2015-05-21 ENCOUNTER — Ambulatory Visit (INDEPENDENT_AMBULATORY_CARE_PROVIDER_SITE_OTHER): Payer: Medicare Other | Admitting: Thoracic Surgery (Cardiothoracic Vascular Surgery)

## 2015-05-21 ENCOUNTER — Ambulatory Visit
Admission: RE | Admit: 2015-05-21 | Discharge: 2015-05-21 | Disposition: A | Discharge status: Home / Self Care | Payer: Medicare Other | Source: Ambulatory Visit | Attending: Thoracic Surgery (Cardiothoracic Vascular Surgery) | Admitting: Thoracic Surgery (Cardiothoracic Vascular Surgery)

## 2015-05-21 ENCOUNTER — Encounter: Payer: Self-pay | Admitting: Thoracic Surgery (Cardiothoracic Vascular Surgery)

## 2015-05-21 VITALS — BP 120/86 | HR 82 | Resp 20 | Ht 61.0 in | Wt 225.0 lb

## 2015-05-21 DIAGNOSIS — D381 Neoplasm of uncertain behavior of trachea, bronchus and lung: Secondary | ICD-10-CM

## 2015-05-21 DIAGNOSIS — R911 Solitary pulmonary nodule: Secondary | ICD-10-CM

## 2015-05-21 DIAGNOSIS — Z853 Personal history of malignant neoplasm of breast: Secondary | ICD-10-CM

## 2015-05-21 NOTE — Progress Notes (Signed)
[address]       [place] [address]             [phone]       HPI:  Mrs. Basford returns for one year follow-up visit regarding a right lung nodule.  We have been following this nodule since 2013. I last saw her in the office a year ago. At that time the nodule was unchanged.  She says she's been feeling well. She had glaucoma surgery about 2 weeks ago. That went well. She has not had any unusual cough or hemoptysis. She denies chest pain or shortness of breath.  Past Medical History  Diagnosis Date  . Breast cancer   . Macular degeneration   . Glaucoma   . Sleep apnea   . Cataracts, bilateral   . Nodule of right lung   . Hypertension   . Hyperlipidemia   . Obesity       Current Outpatient Prescriptions  Medication Sig Dispense Refill  . amLODipine (NORVASC) 10 MG tablet TAKE 1 TABLET (10 MG TOTAL) BY MOUTH DAILY. 90 tablet 2  . aspirin 81 MG tablet Take 81 mg by mouth daily.    [name] atropine 1 % ophthalmic solution     . BENICAR 20 MG tablet Take 10 mg by mouth daily.     . brimonidine (ALPHAGAN) 0.2 % ophthalmic solution     . cholecalciferol (VITAMIN D) 1000 UNITS tablet Take 5,000 Units by mouth daily.    . clotrimazole-betamethasone (LOTRISONE) cream     . diazepam (VALIUM) 5 MG tablet Take 5 mg by mouth as directed.     . dorzolamide-timolol (COSOPT) 22.3-6.8 MG/ML ophthalmic solution Place 1 drop into both eyes as directed.     . fluticasone (CUTIVATE) 0.05 % cream     . furosemide (LASIX) 40 MG tablet Take 20 mg by mouth daily.     [name] ketoconazole (NIZORAL) 2 % cream     . latanoprost (XALATAN) 0.005 % ophthalmic solution Place 1 drop into both eyes at bedtime.    [name] levothyroxine (SYNTHROID, LEVOTHROID) 75 MCG tablet Take 75 mcg by mouth daily before breakfast.     . metroNIDAZOLE (METROCREAM) 0.75 % cream Apply topically. Under breasts prn    . multivitamin-lutein (OCUVITE-LUTEIN) CAPS Take 1 capsule by mouth daily.    [name] NITROSTAT 0.4  MG SL tablet Place 0.4 mg under the tongue every 5 (five) minutes as needed.     . NON FORMULARY CPAP machine     . omeprazole (PRILOSEC) 20 MG capsule Take 20 mg by mouth every other day.     . rosuvastatin (CRESTOR) 10 MG tablet Take 10 mg by mouth. Monday and Friday     No current facility-administered medications for this visit.    Physical Exam BP 120/86 mmHg  Pulse 82  Resp 20  Ht 5\' 1"  (1.549 m)  Wt 225 lb (102.059 kg)  BMI 42.54 kg/m2  SpO12 22% 79 year old obese woman in no acute distress Alert and oriented 3 with no focal deficits Cardiac regular rate and rhythm normal S1 and S2 Lungs diminished breath sounds in the bases otherwise clear  Diagnostic Tests: CT CHEST WITHOUT CONTRAST  TECHNIQUE: Multidetector CT imaging of the chest was performed following the standard protocol without IV contrast.  COMPARISON: Most recent chest CT from 05/22/2014.  FINDINGS: Mediastinum/Nodes: Stable mild cardiomegaly. No pericardial fluid/thickening. There is atherosclerosis of the thoracic aorta, the great vessels of the mediastinum  and the coronary arteries, including calcified atherosclerotic plaque in the left anterior descending and left circumflex coronary arteries. Atherosclerotic nonaneurysmal thoracic aorta. Dilated main pulmonary artery (4.0 cm diameter), unchanged, in keeping with pulmonary hypertension. Normal visualized thyroid. Normal esophagus. Status post right axillary node dissection. No pathologically enlarged axillary or mediastinal lymph nodes. No gross hilar adenopathy, noting limited sensitivity for the detection of hilar adenopathy on a noncontrast study.  Lungs/Pleura: No pneumothorax. No pleural effusion. There is stable sharply marginated consolidation with associated architectural distortion and traction bronchiectasis in the anterior right upper lobe, in keeping with stable radiation fibrosis. There is a 0.9 cm solid right lower lobe pulmonary  nodule (series 4/image 30), which is stable to minimally increased from 0.8 cm on 05/22/2014. There is a 0.3 cm left lower lobe pulmonary nodule (4/36), unchanged since 05/22/2014, minimally increased from 0.2 cm on 03/14/2013. No new significant pulmonary nodules. No acute consolidative airspace disease. Stable mild scarring versus atelectasis in the dependent lingula and left lower lobe.  Upper abdomen: Status post cholecystectomy. Two stable 1.0 cm simple liver cysts in the posterior right lower lobe. An incompletely characterized 2.5 cm and 2.0 cm renal cysts in the posterior mid right kidney, the visualized portions of which are simple. Simple exophytic 1.2 cm lateral upper left renal cyst. Stable coarse calcification in the central liver, likely from prior granulomatous disease.  Musculoskeletal: Stable postsurgical changes from right mastectomy. Moderate degenerative changes in the thoracic spine. No aggressive appearing focal osseous lesions.  IMPRESSION: 1. Stable to slightly enlarged right lower lobe pulmonary nodule compared to 05/22/14, now 9 mm. Interval stability of a 3 mm left lower lobe pulmonary nodule compared to 03/14/13, which is minimally increased in size since 03/14/2013. These nodules are probably benign, and were non-FDG avid on 07/25/12 PET-CT. No new pulmonary nodules. Continued chest CT surveillance is advised in 6-12 months. 2. Stable radiation fibrosis in the anterior right upper lobe. 3. Stable dilated main pulmonary artery, in keeping with pulmonary arterial hypertension.   Electronically Signed  By: Ilona Sorrel M.D.  On: 05/21/2015 13:48  I personally reviewed the CT scan and concur with the findings as noted above. There has been a slight increase in size of the right lower lobe nodule in the interim since her last visit, but the overall appearance is still consistent with benign nodule  Impression: 79 year old woman with bilateral lung  nodules. There is a 3 mm left lower lobe nodule that is unchanged. There is a 9 mm nodule in the right lower lobe that has increased in size by about a millimeter over the past year. This is a round well-circumscribed nodule. It most likely is benign. However given that it has increased in size at do think we should keep an eye on this. I recommended that we repeat her scan and a year.  Plan: Return in one year with CT chest  Melrose Nakayama, MD [facility] [phone]

## 2015-08-27 ENCOUNTER — Other Ambulatory Visit: Payer: Self-pay

## 2015-08-27 DIAGNOSIS — Z1231 Encounter for screening mammogram for malignant neoplasm of breast: Secondary | ICD-10-CM

## 2015-09-17 ENCOUNTER — Telehealth: Payer: Self-pay | Admitting: Cardiovascular Disease

## 2015-09-17 NOTE — Telephone Encounter (Signed)
Received records from [facility] for appointment on 09/23/15 with Dr Gwenlyn Found.  Records given to [facility] (medical records) for Dr Kennon Holter schedule on 09/23/15. lp

## 2015-09-23 ENCOUNTER — Ambulatory Visit (INDEPENDENT_AMBULATORY_CARE_PROVIDER_SITE_OTHER): Payer: Medicare Other | Admitting: Cardiovascular Disease

## 2015-09-23 ENCOUNTER — Encounter: Payer: Self-pay | Admitting: Cardiovascular Disease

## 2015-09-23 VITALS — BP 180/110 | HR 75 | Ht 62.0 in | Wt 223.3 lb

## 2015-09-23 DIAGNOSIS — R0789 Other chest pain: Secondary | ICD-10-CM | POA: Diagnosis not present

## 2015-09-23 DIAGNOSIS — R079 Chest pain, unspecified: Secondary | ICD-10-CM | POA: Diagnosis not present

## 2015-09-23 DIAGNOSIS — I10 Essential (primary) hypertension: Secondary | ICD-10-CM

## 2015-09-23 DIAGNOSIS — E785 Hyperlipidemia, unspecified: Secondary | ICD-10-CM | POA: Insufficient documentation

## 2015-09-23 MED ORDER — BENICAR 20 MG PO TABS: 20.0000 mg | ORAL_TABLET | Freq: Every day | ORAL | Status: DC

## 2015-09-23 NOTE — Patient Instructions (Addendum)
Medication Instructions:  Your physician has recommended you make the following change in your medication:  1-Increase Benicar 20 mg by mouth daily  Labwork: NONE  Testing/Procedures: Your physician has requested that you have a lexiscan myoview. For further information please visit [email or web address]. Please follow instruction sheet, as given.   Follow-Up: We request that you follow-up in: 2 months with Dr Gwenlyn Found Your physician recommends that you schedule a follow-up appointment with [name] the Pharmacist every week for four weeks for Blood Pressure.   If you need a refill on your cardiac medications before your next appointment, please call your pharmacy.

## 2015-09-23 NOTE — Assessment & Plan Note (Signed)
History of hyperlipidemia on Crestor followed by her PCP 

## 2015-09-23 NOTE — Assessment & Plan Note (Signed)
Recent episode of chest pain resulted in an ER visit. He was primarily nocturnal. She does have risk factors including hypertension, hyperlipidemia and strong family history of heart disease. We will obtain a pharmacologic Myoview stress test to risk stratify her.

## 2015-09-23 NOTE — Assessment & Plan Note (Signed)
History of hypertension with blood pressure in the office today measured at 180/110. She is on Benicar 10 mg a day as well as furosemide. We talked about salt restriction. I'm going to increase her Benicar to 20 mg a day and we'll have her see [name] back in the office on a weekly basis for the next 4 weeks. If her blood pressure still more difficult control we may consider adding a beta blocker such as Coreg or Bystolic .[name]

## 2015-09-23 NOTE — Progress Notes (Signed)
09/23/2015 Amy Hahn   06-05-1936  [id number]  Primary Physician [name], MD Primary Cardiologist: Lorretta Harp MD Renae Gloss   HPI:  Amy Hahn is a 79 year old moderately overweight very Caucasian female mother of 41, grandmother and 3 grandchildren who is accompanied by her daughter Nira Conn today. Amy Hahn was referred by Dr. Maudie Mercury for cardiovascular evaluation because of a recent episode of chest pain. Her risk factors include treated hypertension and hyperlipidemia as well as a strong family history of heart disease with a father who died of a myocardial infarction, a brother who's had stents and a son who's had a heart attack at age 57. Amy Hahn has never had a heart attack or stroke. Amy Hahn had recent nocturnal chest pain 2 days in a row resulting in ER evaluation. Her blood pressure has been high recently.   Current Outpatient Prescriptions  Medication Sig Dispense Refill  . aspirin 81 MG tablet Take 81 mg by mouth daily.    [name] BENICAR 20 MG tablet Take 10 mg by mouth daily.     . brimonidine (ALPHAGAN) 0.2 % ophthalmic solution Place 2 drops into both eyes daily.     . cholecalciferol (VITAMIN D) 1000 UNITS tablet Take 5,000 Units by mouth daily.    . clotrimazole-betamethasone (LOTRISONE) cream Apply 1 application topically daily.     . diazepam (VALIUM) 5 MG tablet Take 5 mg by mouth as directed.     . dorzolamide-timolol (COSOPT) 22.3-6.8 MG/ML ophthalmic solution Place 1 drop into both eyes as directed.     . furosemide (LASIX) 40 MG tablet Take 20 mg by mouth daily.     [name] ketoconazole (NIZORAL) 2 % cream     . latanoprost (XALATAN) 0.005 % ophthalmic solution Place 1 drop into both eyes at bedtime.    [name] levothyroxine (SYNTHROID, LEVOTHROID) 75 MCG tablet Take 75 mcg by mouth daily before breakfast.     . multivitamin-lutein (OCUVITE-LUTEIN) CAPS Take 1 capsule by mouth daily.    [name] NITROSTAT 0.4 MG SL tablet Place 0.4 mg under the tongue every 5 (five) minutes as  needed.     . NON FORMULARY CPAP machine     . omeprazole (PRILOSEC) 20 MG capsule Take 20 mg by mouth every other day.     . rosuvastatin (CRESTOR) 10 MG tablet Take 10 mg by mouth. Monday and Friday     No current facility-administered medications for this visit.    Allergies  Allergen Reactions  . Belviq [Lorcaserin Hcl] Itching  . Edarbi [Azilsartan] Other (See Comments)    Lupus  . Morphine And Related Other (See Comments)    "MAKES ME CRAZY"  . Latex Rash    " IF ON ME FOR OVER 24 HOURS"    Social History   Social History  . Marital Status: Married    Spouse Name: N/A  . Number of Children: N/A  . Years of Education: N/A   Occupational History  . Not on file.   Social History Main Topics  . Smoking status: Former Smoker -- 1.00 packs/day    Types: Cigarettes    Start date: 08/09/1960  . Smokeless tobacco: Never Used  . Alcohol Use: No  . Drug Use: No  . Sexual Activity: Not on file   Other Topics Concern  . Not on file   Social History Narrative     Review of Systems: General: negative for chills, fever, night sweats or weight changes.  Cardiovascular: negative for chest  pain, dyspnea on exertion, edema, orthopnea, palpitations, paroxysmal nocturnal dyspnea or shortness of breath Dermatological: negative for rash Respiratory: negative for cough or wheezing Urologic: negative for hematuria Abdominal: negative for nausea, vomiting, diarrhea, bright red blood per rectum, melena, or hematemesis Neurologic: negative for visual changes, syncope, or dizziness All other systems reviewed and are otherwise negative except as noted above.    Blood pressure 180/110, pulse 75, height 5\' 2"  (1.575 m), weight 223 lb 4.8 oz (101.288 kg).  General appearance: alert and no distress Neck: no adenopathy, no carotid bruit, no JVD, supple, symmetrical, trachea midline and thyroid not enlarged, symmetric, no tenderness/mass/nodules Lungs: clear to auscultation  bilaterally Heart: regular rate and rhythm, S1, S2 normal, no murmur, click, rub or gallop Extremities: extremities normal, atraumatic, no cyanosis or edema  EKG normal sinus rhythm at 75 without ST or T-wave changes. There was left axis deviation. I personally reviewed this EKG  ASSESSMENT AND PLAN:   HYPERTENSION, BENIGN History of hypertension with blood pressure in the office today measured at 180/110. Amy Hahn is on Benicar 10 mg a day as well as furosemide. We talked about salt restriction. I'm going to increase her Benicar to 20 mg a day and we'll have her see [name] back in the office on a weekly basis for the next 4 weeks. If her blood pressure still more difficult control we may consider adding a beta blocker such as Coreg or Bystolic .[name]  Chest pain Recent episode of chest pain resulted in an ER visit. He was primarily nocturnal. Amy Hahn does have risk factors including hypertension, hyperlipidemia and strong family history of heart disease. We will obtain a pharmacologic Myoview stress test to risk stratify her.  Hyperlipidemia History of hyperlipidemia on Crestor followed by her PCP      Lorretta Harp MD [facility], [place] 09/23/2015 3:00 PM

## 2015-09-25 ENCOUNTER — Telehealth (HOSPITAL_COMMUNITY): Payer: Self-pay

## 2015-09-25 NOTE — Telephone Encounter (Signed)
Encounter complete. 

## 2015-10-01 ENCOUNTER — Ambulatory Visit (HOSPITAL_COMMUNITY)
Admission: RE | Admit: 2015-10-01 | Discharge: 2015-10-01 | Disposition: A | Discharge status: Home / Self Care | Payer: Medicare Other | Source: Ambulatory Visit | Attending: Cardiovascular Disease | Admitting: Cardiovascular Disease

## 2015-10-01 DIAGNOSIS — R0602 Shortness of breath: Secondary | ICD-10-CM | POA: Diagnosis not present

## 2015-10-01 DIAGNOSIS — R0609 Other forms of dyspnea: Secondary | ICD-10-CM | POA: Insufficient documentation

## 2015-10-01 DIAGNOSIS — I779 Disorder of arteries and arterioles, unspecified: Secondary | ICD-10-CM | POA: Insufficient documentation

## 2015-10-01 DIAGNOSIS — Z8249 Family history of ischemic heart disease and other diseases of the circulatory system: Secondary | ICD-10-CM | POA: Diagnosis not present

## 2015-10-01 DIAGNOSIS — I10 Essential (primary) hypertension: Secondary | ICD-10-CM

## 2015-10-01 DIAGNOSIS — Z87891 Personal history of nicotine dependence: Secondary | ICD-10-CM | POA: Insufficient documentation

## 2015-10-01 DIAGNOSIS — R079 Chest pain, unspecified: Secondary | ICD-10-CM

## 2015-10-01 DIAGNOSIS — E669 Obesity, unspecified: Secondary | ICD-10-CM | POA: Diagnosis not present

## 2015-10-01 DIAGNOSIS — R9439 Abnormal result of other cardiovascular function study: Secondary | ICD-10-CM | POA: Insufficient documentation

## 2015-10-01 DIAGNOSIS — I517 Cardiomegaly: Secondary | ICD-10-CM | POA: Insufficient documentation

## 2015-10-01 DIAGNOSIS — Z6841 Body Mass Index (BMI) 40.0 and over, adult: Secondary | ICD-10-CM | POA: Insufficient documentation

## 2015-10-01 DIAGNOSIS — G4733 Obstructive sleep apnea (adult) (pediatric): Secondary | ICD-10-CM | POA: Diagnosis not present

## 2015-10-01 DIAGNOSIS — R002 Palpitations: Secondary | ICD-10-CM | POA: Insufficient documentation

## 2015-10-01 LAB — MYOCARDIAL PERFUSION IMAGING
CHL CUP NUCLEAR SDS: 2
CSEPPHR: 81 {beats}/min
LV dias vol: 133 mL
LVSYSVOL: 69 mL
Rest HR: 71 {beats}/min
SRS: 2
SSS: 3
TID: 1.07

## 2015-10-01 MED ORDER — TECHNETIUM TC 99M SESTAMIBI GENERIC - CARDIOLITE: 10.0000 | Freq: Once | INTRAVENOUS | Status: DC | PRN

## 2015-10-01 MED ORDER — TECHNETIUM TC 99M SESTAMIBI GENERIC - CARDIOLITE
31.0000 | Freq: Once | INTRAVENOUS | Status: AC | PRN
  Administered 2015-10-01: 31 via INTRAVENOUS

## 2015-10-01 MED ORDER — TECHNETIUM TC 99M SESTAMIBI GENERIC - CARDIOLITE: 30.5000 | Freq: Once | INTRAVENOUS | Status: DC | PRN

## 2015-10-01 MED ORDER — AMINOPHYLLINE 25 MG/ML IV SOLN: 125.0000 mg | Freq: Once | INTRAVENOUS | Status: DC

## 2015-10-01 MED ORDER — AMINOPHYLLINE 25 MG/ML IV SOLN: 75.0000 mg | Freq: Once | INTRAVENOUS | Status: DC

## 2015-10-01 MED ORDER — REGADENOSON 0.4 MG/5ML IV SOLN: 0.4000 mg | Freq: Once | INTRAVENOUS | Status: DC

## 2015-10-01 MED ORDER — TECHNETIUM TC 99M SESTAMIBI GENERIC - CARDIOLITE
10.9000 | Freq: Once | INTRAVENOUS | Status: AC | PRN
  Administered 2015-10-01: 10.9 via INTRAVENOUS

## 2015-10-01 MED ORDER — REGADENOSON 0.4 MG/5ML IV SOLN
0.4000 mg | Freq: Once | INTRAVENOUS | Status: AC
  Administered 2015-10-01: 0.4 mg via INTRAVENOUS

## 2015-10-02 ENCOUNTER — Telehealth: Payer: Self-pay | Admitting: Cardiovascular Disease

## 2015-10-02 NOTE — Telephone Encounter (Signed)
Noted.  Pt on my schedule for Thursday Dec 29.  Will review with her at that time

## 2015-10-02 NOTE — Telephone Encounter (Signed)
Patient called after finding her RX for Benicar is the same that she has been on for over a year States she has not been on Benicar 10 mg up to her visit with Dr. Gwenlyn Found, she has been on Benicar 20 mg for over a year At appointment on 12/19th with Dr. Gwenlyn Found her Benicar was increased from 10 mg to 20 mg; patient states she has been taking one 20 mg Benicar for over a year. Has scheduled appointment with [name] for tomorrow for BP and RX follow up. Patient said she took Benicar 20 mg two this morning because her BP was running 180/100 Instructed not to drive if she felt any weakness or dizziness.   Routed to [place]  Routed to Dr. Gwenlyn Found For their information

## 2015-10-03 ENCOUNTER — Encounter: Payer: Self-pay | Admitting: Pharmacist Clinician (PhC)/ Clinical Pharmacy Specialist

## 2015-10-03 ENCOUNTER — Ambulatory Visit
Admission: RE | Admit: 2015-10-03 | Discharge: 2015-10-03 | Disposition: A | Discharge status: Home / Self Care | Payer: Medicare Other | Source: Ambulatory Visit

## 2015-10-03 ENCOUNTER — Ambulatory Visit (INDEPENDENT_AMBULATORY_CARE_PROVIDER_SITE_OTHER): Payer: Medicare Other | Admitting: Pharmacist Clinician (PhC)/ Clinical Pharmacy Specialist

## 2015-10-03 VITALS — BP 164/110 | HR 72 | Ht 62.0 in | Wt 225.1 lb

## 2015-10-03 DIAGNOSIS — Z1231 Encounter for screening mammogram for malignant neoplasm of breast: Secondary | ICD-10-CM

## 2015-10-03 DIAGNOSIS — I10 Essential (primary) hypertension: Secondary | ICD-10-CM | POA: Diagnosis not present

## 2015-10-03 MED ORDER — CHLORTHALIDONE 25 MG PO TABS: ORAL_TABLET | ORAL | Status: DC

## 2015-10-03 NOTE — Progress Notes (Signed)
10/03/2015 Amy Hahn [birth year] [id number]   HPI:  Amy Hahn is a 79 y.o. female patient of Dr Gwenlyn Found, with a PMH below who presents today for hypertension clinic evaluation.  Patient is moderately obese and has a difficult time getting her blood pressure checked.  It is not easy to hear the Krotkoff sounds in her arm, and she gets uncomfortable when the cuff is inflated to 160 or higher.  She has a home cuff, but has not been able to get any readings from it.    Cardiac Hx: hypertension, hyperlipidemia, chest pain  Family Hx: includes brother with MI at 48 then CABG, has stents in legs and kidneys as well; son had MI at 13 and still smokes; father died from MI at 85  Social Hx: smoked for only about 5 years and quit close to 60 years ago; does not drink alcohol or caffeine (it makes her too jittery)  Diet: does eat out 3-4 times per week, usually small diners or [place]; does not add salt to home cooked foods; drinks 42 oz of water per day with Crystal Lite added  Exercise: none since broke ankle 3 years ago and had both knees replaced  Home BP readings: inaccurate due to size of upper arm  Current antihypertensive medications: Benicar 40 mg, furosemide 20 mg most days (does not take if going out)  Wt Readings from Last 3 Encounters:  10/03/15 225 lb 1.6 oz (102.105 kg)  10/01/15 223 lb (101.152 kg)  09/23/15 223 lb 4.8 oz (101.288 kg)   BP Readings from Last 3 Encounters:  10/03/15 164/110  09/23/15 180/110  05/21/15 120/86   Pulse Readings from Last 3 Encounters:  10/03/15 72  09/23/15 75  05/21/15 82    Current Outpatient Prescriptions  Medication Sig Dispense Refill  . aspirin 81 MG tablet Take 81 mg by mouth daily.    [name] BENICAR 20 MG tablet Take 1 tablet (20 mg total) by mouth daily. 30 tablet 11  . brimonidine (ALPHAGAN) 0.2 % ophthalmic solution Place 2 drops into both eyes daily.     . chlorthalidone (HYGROTON) 25 MG tablet Take 1/2 to 1 tablet daily as  directed 30 tablet 3  . cholecalciferol (VITAMIN D) 1000 UNITS tablet Take 5,000 Units by mouth daily.    . clotrimazole-betamethasone (LOTRISONE) cream Apply 1 application topically daily.     . diazepam (VALIUM) 5 MG tablet Take 5 mg by mouth as directed.     . dorzolamide-timolol (COSOPT) 22.3-6.8 MG/ML ophthalmic solution Place 1 drop into both eyes as directed.     . furosemide (LASIX) 40 MG tablet Take 20 mg by mouth daily.     [name] ketoconazole (NIZORAL) 2 % cream     . latanoprost (XALATAN) 0.005 % ophthalmic solution Place 1 drop into both eyes at bedtime.    [name] levothyroxine (SYNTHROID, LEVOTHROID) 75 MCG tablet Take 75 mcg by mouth daily before breakfast.     . multivitamin-lutein (OCUVITE-LUTEIN) CAPS Take 1 capsule by mouth daily.    [name] NITROSTAT 0.4 MG SL tablet Place 0.4 mg under the tongue every 5 (five) minutes as needed.     . NON FORMULARY CPAP machine     . omeprazole (PRILOSEC) 20 MG capsule Take 20 mg by mouth every other day.     . rosuvastatin (CRESTOR) 10 MG tablet Take 10 mg by mouth. Monday and Friday     No current facility-administered medications for this visit.  Allergies  Allergen Reactions  . Belviq [Lorcaserin Hcl] Itching  . Edarbi [Azilsartan] Other (See Comments)    Lupus  . Morphine And Related Other (See Comments)    "MAKES ME CRAZY"  . Latex Rash    " IF ON ME FOR OVER 24 HOURS"    Past Medical History  Diagnosis Date  . Breast cancer ([place])   . Macular degeneration   . Glaucoma   . Sleep apnea   . Cataracts, bilateral   . Nodule of right lung   . Hypertension   . Hyperlipidemia   . Obesity     Blood pressure 164/110, pulse 72, height 5\' 2"  (1.575 m), weight 225 lb 1.6 oz (102.105 kg).    Tommy Medal PharmD CPP [facility]

## 2015-10-03 NOTE — Assessment & Plan Note (Signed)
We checked her blood pressure several times, both myself and Kim RN for Dr. Gwenlyn Found.  Also used the electronic office cuff, although it only read "error".   After multiple attempts BP was 164/110.   Patient reports sensitivities to multiple medications, including medication induced lupus rash from [place].  She states her full allergy list of BP medications is at Dr. Julianne Rice office.  I will try to get that information before her next visit.  However with her pressure still elevated we will start her on chlorthalidone.  Would like to try amlodipine, however she believes that to be one of her sensitivities.  Because of her concerns about new medications, I will start her at 12.5 mg once daily.  I have changed her appointments to every 2 weeks and will see her back then to determine if any improvement with the chlorthalidone.  She is to call in the meantime if she has any problems with the medication.  Patient is agreeable to plan.

## 2015-10-03 NOTE — Patient Instructions (Signed)
Return for a a follow up appointment in 2 weeks  Your blood pressure today is 164/110  (goal is < 150/90)  Take your BP meds as follows: add chlorthalidone 12.5 mg (1/2 tablet) each morning;  Continue with all other medication  Exercise as you're able, try to walk approximately 30 minutes per day.  Keep salt intake to a minimum, especially watch canned and prepared boxed foods.  Eat more fresh fruits and vegetables and fewer canned items.  Avoid eating in fast food restaurants.

## 2015-10-06 DIAGNOSIS — I4821 Permanent atrial fibrillation: Secondary | ICD-10-CM

## 2015-10-06 HISTORY — DX: Permanent atrial fibrillation: I48.21

## 2015-10-10 ENCOUNTER — Ambulatory Visit: Payer: Medicare Other | Admitting: Pharmacist Clinician (PhC)/ Clinical Pharmacy Specialist

## 2015-10-16 ENCOUNTER — Telehealth: Payer: Self-pay | Admitting: Cardiovascular Disease

## 2015-10-16 NOTE — Telephone Encounter (Signed)
Pt called requesting med refill for her benicar - reports she is taking 2x 20mg  doses daily bc there was an initial miscommunication about the reported dose (she reported when seen in Dec that she was taking 10mg  daily, and was instructed to increase dose to 20mg  for BP control. When she got home, she noted her pill bottle was for 20mg  strength.)  Informed pt I would have to defer to [facility] or Dr. Gwenlyn Found for advice on this before sending an adjusted dose Rx.   She has no recent BPs to report. Pt reports she has difficulty in obtaining BPs at home, d/t her equipment. Elevated at last visit. Pt then noted she has f/u w/ [name] tomorrow - BP visit. She has enough medication to get through tomorrow.  I advised to address these concerns/med questions tomorrow. Pt voiced understanding.

## 2015-10-16 NOTE — Telephone Encounter (Signed)
Pt is calling in stating that her Benicar prescription was suppose to be doubled to 40mg  instead of 20mg . She also requested the generic for Benicar because it is cheaper. She needs this called in to the [facility] in [place] today if possible.  Thanks

## 2015-10-17 ENCOUNTER — Encounter: Payer: Self-pay | Admitting: Pharmacist Clinician (PhC)/ Clinical Pharmacy Specialist

## 2015-10-17 ENCOUNTER — Ambulatory Visit (INDEPENDENT_AMBULATORY_CARE_PROVIDER_SITE_OTHER): Payer: Medicare Other | Admitting: Pharmacist Clinician (PhC)/ Clinical Pharmacy Specialist

## 2015-10-17 VITALS — BP 160/94 | HR 72 | Ht 62.0 in | Wt 218.6 lb

## 2015-10-17 DIAGNOSIS — I10 Essential (primary) hypertension: Secondary | ICD-10-CM | POA: Diagnosis not present

## 2015-10-17 NOTE — Patient Instructions (Signed)
   Your blood pressure today is 160/94  Take your BP meds as follows: increase chlorthalidone to 1 tablet (25mg ) once daily, continue with the Benicar 40 mg   Bring all of your meds, your BP cuff and your record of home blood pressures to your next appointment.  Exercise as you're able, try to walk approximately 30 minutes per day.  Keep salt intake to a minimum, especially watch canned and prepared boxed foods.  Eat more fresh fruits and vegetables and fewer canned items.  Avoid eating in fast food restaurants.    HOW TO TAKE YOUR BLOOD PRESSURE: . Rest 5 minutes before taking your blood pressure. .  Don't smoke or drink caffeinated beverages for at least 30 minutes before. . Take your blood pressure before (not after) you eat. . Sit comfortably with your back supported and both feet on the floor (don't cross your legs). . Elevate your arm to heart level on a table or a desk. . Use the proper sized cuff. It should fit smoothly and snugly around your bare upper arm. There should be enough room to slip a fingertip under the cuff. The bottom edge of the cuff should be 1 inch above the crease of the elbow. . Ideally, take 3 measurements at one sitting and record the average.

## 2015-10-17 NOTE — Assessment & Plan Note (Addendum)
Her blood pressure is slightly improved today, with the systolic almost unchanged, but diastolic down 16 points.  Will increase her chlorthalidone to a full 25 mg tablet starting tomorrow and see her back in 2 weeks for follow up.  She was praised on the almost 7 pound weight loss and encouraged to continue with her walking and decreasing dietary sodium.   She had labs recently by Dr. Maudie Mercury, will send request to that office for a copy.

## 2015-10-17 NOTE — Progress Notes (Signed)
10/17/2015 Amy Hahn 10/12/1935 [id number]   HPI:  [name] is a 80 y.o. female patient of Dr Amy Hahn, with a PMH below who presents today for hypertension clinic follow up.  At her last visit we had a difficult time in hearing Krotkoff sounds in either arm.  Because of her large arm circumference, the thigh cuff was used.  Today it was much easier to hear her pressure, which was checked in the right arm due to a bleeding skin tear on her left.  At her last visit we added chlorthalidone 12.5 mg once daily.  Patient has reported side effects with multiple medications and was hesitant to start.  Therefore we started her at the low dose of chlorthalidone, and she appears to have done well with it.     Cardiac Hx: hypertension, hyperlipidemia, chest pain  Family Hx: includes brother with MI at 49 then CABG, has stents in legs and kidneys as well; son had MI at 10 and still smokes; father died from MI at 27  Social Hx: smoked for only about 5 years and quit close to 60 years ago; does not drink alcohol or caffeine (it makes her too jittery)  Diet: does eat out 3-4 times per week, usually small diners or [place]; does not add salt to home cooked foods; drinks 42 oz of water per day with Crystal Lite added  Exercise: started walking in more while in stores  Home BP readings: inaccurate due to size of upper arm  Current antihypertensive medications: Benicar 40 mg, chlorthalidone 12.5 mg,  furosemide 20 mg most days (does not take if going out)  Wt Readings from Last 3 Encounters:  10/17/15 218 lb 9.6 oz (99.156 kg)  10/03/15 225 lb 1.6 oz (102.105 kg)  10/01/15 223 lb (101.152 kg)   BP Readings from Last 3 Encounters:  10/17/15 160/94  10/03/15 164/110  09/23/15 180/110   Pulse Readings from Last 3 Encounters:  10/17/15 72  10/03/15 72  09/23/15 75    Current Outpatient Prescriptions  Medication Sig Dispense Refill  . aspirin 81 MG tablet Take 81 mg by mouth daily.    .  brimonidine (ALPHAGAN) 0.2 % ophthalmic solution Place 2 drops into both eyes daily.     . chlorthalidone (HYGROTON) 25 MG tablet Take 1/2 to 1 tablet daily as directed 30 tablet 3  . cholecalciferol (VITAMIN D) 1000 UNITS tablet Take 5,000 Units by mouth daily.    . clotrimazole-betamethasone (LOTRISONE) cream Apply 1 application topically daily.     . diazepam (VALIUM) 5 MG tablet Take 5 mg by mouth as directed.     . dorzolamide-timolol (COSOPT) 22.3-6.8 MG/ML ophthalmic solution Place 1 drop into both eyes as directed.     . furosemide (LASIX) 40 MG tablet Take 20 mg by mouth daily.     [name] ketoconazole (NIZORAL) 2 % cream     . latanoprost (XALATAN) 0.005 % ophthalmic solution Place 1 drop into both eyes at bedtime.    [name] levothyroxine (SYNTHROID, LEVOTHROID) 75 MCG tablet Take 75 mcg by mouth daily before breakfast.     . multivitamin-lutein (OCUVITE-LUTEIN) CAPS Take 1 capsule by mouth daily.    [name] NITROSTAT 0.4 MG SL tablet Place 0.4 mg under the tongue every 5 (five) minutes as needed.     . NON FORMULARY CPAP machine     . omeprazole (PRILOSEC) 20 MG capsule Take 20 mg by mouth every other day.     [name]  rosuvastatin (CRESTOR) 10 MG tablet Take 10 mg by mouth. Monday and Friday     No current facility-administered medications for this visit.    Allergies  Allergen Reactions  . Belviq [Lorcaserin Hcl] Itching  . Edarbi [Azilsartan] Other (See Comments)    Lupus  . Morphine And Related Other (See Comments)    "MAKES ME CRAZY"  . Latex Rash    " IF ON ME FOR OVER 24 HOURS"    Past Medical History  Diagnosis Date  . Breast cancer ([place])   . Macular degeneration   . Glaucoma   . Sleep apnea   . Cataracts, bilateral   . Nodule of right lung   . Hypertension   . Hyperlipidemia   . Obesity     Blood pressure 160/94, pulse 72, height 5\' 2"  (1.575 m), weight 218 lb 9.6 oz (99.156 kg).    Tommy Medal PharmD CPP [facility]

## 2015-10-18 MED ORDER — OLMESARTAN MEDOXOMIL 40 MG PO TABS: 40.0000 mg | ORAL_TABLET | Freq: Every day | ORAL | Status: DC

## 2015-10-24 ENCOUNTER — Ambulatory Visit: Payer: Medicare Other | Admitting: Pharmacist Clinician (PhC)/ Clinical Pharmacy Specialist

## 2015-10-29 ENCOUNTER — Encounter: Payer: Self-pay | Admitting: Pharmacist Clinician (PhC)/ Clinical Pharmacy Specialist

## 2015-11-04 ENCOUNTER — Ambulatory Visit (INDEPENDENT_AMBULATORY_CARE_PROVIDER_SITE_OTHER): Payer: Medicare Other | Admitting: Pharmacist Clinician (PhC)/ Clinical Pharmacy Specialist

## 2015-11-04 VITALS — BP 138/80 | HR 72 | Ht 62.0 in | Wt 221.1 lb

## 2015-11-04 DIAGNOSIS — I10 Essential (primary) hypertension: Secondary | ICD-10-CM

## 2015-11-04 NOTE — Patient Instructions (Signed)
Return for a a follow up appointment in 2 weeks with Dr. Gwenlyn Found  Your blood pressure today is 138/80  (goal is < 150/90)  Take your BP meds as follows: Benicar 40 mg and chlorthalidone 25 mg once daily  Bring all of your meds, your BP cuff and your record of home blood pressures to your next appointment.  Exercise as you're able, try to walk approximately 30 minutes per day.  Keep salt intake to a minimum, especially watch canned and prepared boxed foods.  Eat more fresh fruits and vegetables and fewer canned items.  Avoid eating in fast food restaurants.    HOW TO TAKE YOUR BLOOD PRESSURE: . Rest 5 minutes before taking your blood pressure. .  Don't smoke or drink caffeinated beverages for at least 30 minutes before. . Take your blood pressure before (not after) you eat. . Sit comfortably with your back supported and both feet on the floor (don't cross your legs). . Elevate your arm to heart level on a table or a desk. . Use the proper sized cuff. It should fit smoothly and snugly around your bare upper arm. There should be enough room to slip a fingertip under the cuff. The bottom edge of the cuff should be 1 inch above the crease of the elbow. . Ideally, take 3 measurements at one sitting and record the average.

## 2015-11-05 ENCOUNTER — Encounter: Payer: Self-pay | Admitting: Pharmacist Clinician (PhC)/ Clinical Pharmacy Specialist

## 2015-11-05 NOTE — Progress Notes (Signed)
11/05/2015 Amy Hahn 1936-03-04 [id number]   HPI:  Amy Hahn is a 80 y.o. female patient of Dr Gwenlyn Found, with a PMH below who presents today for hypertension clinic follow up.  At most visits we have a difficult time in hearing Krotkoff sounds in either arm, and today was no exception.  I finally was able to hear pressure in her right forearm.  She has a large upper arm circumference, for which the thigh cuff must be used, and it is often difficult to get good stethoscope placement on her short arms with that wide cuff.  She has multiple allergies and sensitivities but has managed to tolerate olmesartan 40 mg and chlorthalidone 25 mg without problem.    Cardiac Hx: hypertension, hyperlipidemia, chest pain  Family Hx: includes brother with MI at 80 then CABG, has stents in legs and kidneys as well; son had MI at 15 and still smokes; father died from MI at 82  Social Hx: smoked for only about 5 years and quit close to 60 years ago; does not drink alcohol or caffeine (it makes her too jittery)  Diet: does eat out 3-4 times per week, usually small diners or [place]; does not add salt to home cooked foods; drinks 42 oz of water per day with Crystal Lite added  Exercise: started walking in more while in stores, states wandered in a fabric store for almost 2 hours yesterday.    Home BP readings: inaccurate due to size of upper arm  Current antihypertensive medications: Benicar 40 mg, chlorthalidone 25 mg,  furosemide 20 mg most days (does not take if going out)  Wt Readings from Last 3 Encounters:  11/05/15 221 lb 1.6 oz (100.29 kg)  10/17/15 218 lb 9.6 oz (99.156 kg)  10/03/15 225 lb 1.6 oz (102.105 kg)   BP Readings from Last 3 Encounters:  11/05/15 138/80  10/17/15 160/94  10/03/15 164/110   Pulse Readings from Last 3 Encounters:  11/05/15 72  10/17/15 72  10/03/15 72    Current Outpatient Prescriptions  Medication Sig Dispense Refill  . aspirin 81 MG tablet Take 81 mg by  mouth daily.    . brimonidine (ALPHAGAN) 0.2 % ophthalmic solution Place 2 drops into both eyes daily.     . chlorthalidone (HYGROTON) 25 MG tablet Take 1/2 to 1 tablet daily as directed 30 tablet 3  . cholecalciferol (VITAMIN D) 1000 UNITS tablet Take 5,000 Units by mouth daily.    . clotrimazole-betamethasone (LOTRISONE) cream Apply 1 application topically daily.     . diazepam (VALIUM) 5 MG tablet Take 5 mg by mouth as directed.     . dorzolamide-timolol (COSOPT) 22.3-6.8 MG/ML ophthalmic solution Place 1 drop into both eyes as directed.     . furosemide (LASIX) 40 MG tablet Take 20 mg by mouth daily.     [name] ketoconazole (NIZORAL) 2 % cream     . latanoprost (XALATAN) 0.005 % ophthalmic solution Place 1 drop into both eyes at bedtime.    [name] levothyroxine (SYNTHROID, LEVOTHROID) 75 MCG tablet Take 75 mcg by mouth daily before breakfast.     . multivitamin-lutein (OCUVITE-LUTEIN) CAPS Take 1 capsule by mouth daily.    [name] NITROSTAT 0.4 MG SL tablet Place 0.4 mg under the tongue every 5 (five) minutes as needed.     . NON FORMULARY CPAP machine     . olmesartan (BENICAR) 40 MG tablet Take 1 tablet (40 mg total) by mouth daily. 30 tablet 6  .  omeprazole (PRILOSEC) 20 MG capsule Take 20 mg by mouth every other day.     . rosuvastatin (CRESTOR) 10 MG tablet Take 10 mg by mouth. Monday and Friday     No current facility-administered medications for this visit.    Allergies  Allergen Reactions  . Ace Inhibitors Cough  . Amitriptyline     Weakness   . Amlodipine     Headache   . Atenolol Other (See Comments)    "decreases blood pressure"  . Belviq [Lorcaserin Hcl] Itching  . Codeine Nausea Only  . Doxazosin     Leg pain  . Dyazide [Hydrochlorothiazide W-Triamterene] Itching  . Edarbi [Azilsartan] Other (See Comments)    Lupus  . Erythromycin Diarrhea    Also cramping  . Labetalol     Bradycardia   . Levatol [Penbutolol Sulfate] Other (See Comments)    Eye swelling  . Losartan      headache  . Monopril [Fosinopril] Hives  . Morphine And Related Other (See Comments)    "MAKES ME CRAZY"  . Penicillins     "in large doses"  . Propranolol Itching  . Ampicillin Rash  . Butrans [Buprenorphine] Rash  . Clotrimazole-Betamethasone Rash  . Elocon [Mometasone Furoate] Rash  . Latex Rash    " IF ON ME FOR OVER 24 HOURS"    Past Medical History  Diagnosis Date  . Breast cancer ([place])   . Macular degeneration   . Glaucoma   . Sleep apnea   . Cataracts, bilateral   . Nodule of right lung   . Hypertension   . Hyperlipidemia   . Obesity     Blood pressure 138/80, pulse 72, height 5\' 2"  (1.575 m), weight 221 lb 1.6 oz (100.29 kg).    Tommy Medal PharmD CPP [facility]

## 2015-11-05 NOTE — Assessment & Plan Note (Signed)
Today her BP appears to be at goal, although it was very difficult to hear.  I have encouraged her to work on weight loss and continued exercise, even just walking around in stores, as her best bet for getting better blood pressure control.  She will continue with the olmesartan and chlorthalidone daily and was encouraged to try the blood pressure kiosks at various stores to see if they could get readings.  She is to call if readings at other MD offices appear to be elevated or she has concerns with her medications.

## 2015-11-19 ENCOUNTER — Ambulatory Visit (INDEPENDENT_AMBULATORY_CARE_PROVIDER_SITE_OTHER): Payer: Medicare Other | Admitting: Cardiovascular Disease

## 2015-11-19 ENCOUNTER — Encounter: Payer: Self-pay | Admitting: Cardiovascular Disease

## 2015-11-19 VITALS — BP 112/80 | HR 77 | Ht 62.0 in | Wt 217.4 lb

## 2015-11-19 DIAGNOSIS — I10 Essential (primary) hypertension: Secondary | ICD-10-CM | POA: Diagnosis not present

## 2015-11-19 DIAGNOSIS — I519 Heart disease, unspecified: Secondary | ICD-10-CM

## 2015-11-19 NOTE — Progress Notes (Signed)
11/19/2015 Amy Hahn   19-Aug-1936  [id number]  Primary Physician [name], MD Primary Cardiologist: Lorretta Harp MD Renae Gloss   HPI:  Amy Hahn is a 80 year old moderately overweight very Caucasian female mother of 35, grandmother and 3 grandchildren who i last saw in the office 09/23/15.[name] She was referred by Dr. Maudie Mercury for cardiovascular evaluation because of a recent episode of chest pain. Her risk factors include treated hypertension and hyperlipidemia as well as a strong family history of heart disease with a father who died of a myocardial infarction, a brother who's had stents and a son who's had a heart attack at age 69. She has never had a heart attack or stroke. She had recent nocturnal chest pain 2 days in a row resulting in ER evaluation. Her blood pressure has been high recently. Since I saw her approximately 6 weeks ago we obtained a Myoview stress test which was low risk although the ejection fraction was 40% by gated SPECT. After adjusting her antihypertensive medications for blood pressure has come down nicely and her chest pain has resolved.     Current Outpatient Prescriptions  Medication Sig Dispense Refill  . aspirin 81 MG tablet Take 81 mg by mouth daily.    . brimonidine (ALPHAGAN) 0.2 % ophthalmic solution Place 2 drops into both eyes daily.     . chlorthalidone (HYGROTON) 25 MG tablet Take 1/2 to 1 tablet daily as directed 30 tablet 3  . cholecalciferol (VITAMIN D) 1000 UNITS tablet Take 5,000 Units by mouth daily.    . clotrimazole-betamethasone (LOTRISONE) cream Apply 1 application topically daily.     . diazepam (VALIUM) 5 MG tablet Take 5 mg by mouth as directed.     . dorzolamide-timolol (COSOPT) 22.3-6.8 MG/ML ophthalmic solution Place 1 drop into both eyes as directed.     . furosemide (LASIX) 40 MG tablet Take 20 mg by mouth daily.     [name] ketoconazole (NIZORAL) 2 % cream Apply 1 application topically at bedtime.     [name] latanoprost  (XALATAN) 0.005 % ophthalmic solution Place 1 drop into both eyes at bedtime.    [name] levothyroxine (SYNTHROID, LEVOTHROID) 75 MCG tablet Take 75 mcg by mouth daily before breakfast.     . metroNIDAZOLE (METROGEL) 0.75 % gel Apply 1 application topically at bedtime.    . multivitamin-lutein (OCUVITE-LUTEIN) CAPS Take 1 capsule by mouth daily.    [name] NITROSTAT 0.4 MG SL tablet Place 0.4 mg under the tongue every 5 (five) minutes as needed for chest pain.     . NON FORMULARY CPAP machine     . olmesartan (BENICAR) 40 MG tablet Take 1 tablet (40 mg total) by mouth daily. 30 tablet 6  . omeprazole (PRILOSEC) 20 MG capsule Take 20 mg by mouth every other day.     . prednisoLONE acetate (PRED FORTE) 1 % ophthalmic suspension Place 1 drop into the left eye 2 (two) times daily.    . rosuvastatin (CRESTOR) 10 MG tablet Take 10 mg by mouth. Monday and Friday     No current facility-administered medications for this visit.    Allergies  Allergen Reactions  . Ace Inhibitors Cough  . Amitriptyline     Weakness   . Amlodipine     Headache   . Atenolol Other (See Comments)    "decreases blood pressure"  . Belviq [Lorcaserin Hcl] Itching  . Codeine Nausea Only  . Doxazosin     Leg pain  .  Dyazide [Hydrochlorothiazide W-Triamterene] Itching  . Edarbi [Azilsartan] Other (See Comments)    Lupus  . Erythromycin Diarrhea    Also cramping  . Labetalol     Bradycardia   . Levatol [Penbutolol Sulfate] Other (See Comments)    Eye swelling  . Losartan     headache  . Monopril [Fosinopril] Hives  . Morphine And Related Other (See Comments)    "MAKES ME CRAZY"  . Penicillins     "in large doses"  . Propranolol Itching  . Ampicillin Rash  . Butrans [Buprenorphine] Rash  . Clotrimazole-Betamethasone Rash  . Elocon [Mometasone Furoate] Rash  . Latex Rash    " IF ON ME FOR OVER 24 HOURS"    Social History   Social History  . Marital Status: Married    Spouse Name: N/A  . Number of Children:  N/A  . Years of Education: N/A   Occupational History  . Not on file.   Social History Main Topics  . Smoking status: Former Smoker -- 1.00 packs/day    Types: Cigarettes    Start date: 08/09/1960  . Smokeless tobacco: Never Used  . Alcohol Use: No  . Drug Use: No  . Sexual Activity: Not on file   Other Topics Concern  . Not on file   Social History Narrative     Review of Systems: General: negative for chills, fever, night sweats or weight changes.  Cardiovascular: negative for chest pain, dyspnea on exertion, edema, orthopnea, palpitations, paroxysmal nocturnal dyspnea or shortness of breath Dermatological: negative for rash Respiratory: negative for cough or wheezing Urologic: negative for hematuria Abdominal: negative for nausea, vomiting, diarrhea, bright red blood per rectum, melena, or hematemesis Neurologic: negative for visual changes, syncope, or dizziness All other systems reviewed and are otherwise negative except as noted above.    Blood pressure 112/80, pulse 77, height 5\' 2"  (1.575 m), weight 217 lb 6.4 oz (98.612 kg).  General appearance: alert and no distress Neck: no adenopathy, no carotid bruit, no JVD, supple, symmetrical, trachea midline and thyroid not enlarged, symmetric, no tenderness/mass/nodules Lungs: clear to auscultation bilaterally Heart: regular rate and rhythm, S1, S2 normal, no murmur, click, rub or gallop Extremities: extremities normal, atraumatic, no cyanosis or edema  EKG not performed today  ASSESSMENT AND PLAN:   HYPERTENSION, BENIGN History of hypertension blood pressure measured today at 112/80. This is markedly improved compared to her presenting blood pressure when I initially saw her. She is currently on chlorthalidone, and Benicar. Continue current meds at current dosing  Chest pain History of recent chest pain with Myoview stress test performed 10/01/15 which was low risk and nonischemic. Since her blood pressure has been  under better control her chest pain has resolved. Her ejection fraction by gated SPECT was 48%. I'm going to obtain a 2-D echo to further characterize this.  Hyperlipidemia History of hyperlipidemia on Crestor followed by her PCP      Lorretta Harp MD [facility], [facility] 11/19/2015 12:25 PM

## 2015-11-19 NOTE — Assessment & Plan Note (Signed)
History of recent chest pain with Myoview stress test performed 10/01/15 which was low risk and nonischemic. Since her blood pressure has been under better control her chest pain has resolved. Her ejection fraction by gated SPECT was 48%. I'm going to obtain a 2-D echo to further characterize this.

## 2015-11-19 NOTE — Patient Instructions (Signed)
Medication Instructions:  Your physician recommends that you continue on your current medications as directed. Please refer to the Current Medication list given to you today.   Labwork: I will get your lab work from your Primary Care Physician.   Testing/Procedures: Your physician has requested that you have an echocardiogram. Echocardiography is a painless test that uses sound waves to create images of your heart. It provides your doctor with information about the size and shape of your heart and how well your heart's chambers and valves are working. This procedure takes approximately one hour. There are no restrictions for this procedure.    Follow-Up: Your physician wants you to follow-up in: 12 months with Dr. Gwenlyn Found. You will receive a reminder letter in the mail two months in advance. If you don't receive a letter, please call our office to schedule the follow-up appointment.   Any Other Special Instructions Will Be Listed Below (If Applicable).     If you need a refill on your cardiac medications before your next appointment, please call your pharmacy.

## 2015-11-19 NOTE — Assessment & Plan Note (Signed)
History of hypertension blood pressure measured today at 112/80. This is markedly improved compared to her presenting blood pressure when I initially saw her. She is currently on chlorthalidone, and Benicar. Continue current meds at current dosing

## 2015-11-19 NOTE — Assessment & Plan Note (Signed)
History of hyperlipidemia on Crestor followed by her PCP 

## 2015-12-02 ENCOUNTER — Other Ambulatory Visit: Payer: Self-pay

## 2015-12-02 ENCOUNTER — Ambulatory Visit (HOSPITAL_COMMUNITY): Payer: Medicare Other | Attending: Internal Medicine

## 2015-12-02 DIAGNOSIS — I10 Essential (primary) hypertension: Secondary | ICD-10-CM

## 2015-12-02 DIAGNOSIS — I119 Hypertensive heart disease without heart failure: Secondary | ICD-10-CM | POA: Insufficient documentation

## 2015-12-02 DIAGNOSIS — I340 Nonrheumatic mitral (valve) insufficiency: Secondary | ICD-10-CM | POA: Diagnosis not present

## 2015-12-02 DIAGNOSIS — Z87891 Personal history of nicotine dependence: Secondary | ICD-10-CM | POA: Insufficient documentation

## 2015-12-02 DIAGNOSIS — I313 Pericardial effusion (noninflammatory): Secondary | ICD-10-CM | POA: Diagnosis not present

## 2015-12-02 DIAGNOSIS — I351 Nonrheumatic aortic (valve) insufficiency: Secondary | ICD-10-CM | POA: Diagnosis not present

## 2015-12-02 DIAGNOSIS — E785 Hyperlipidemia, unspecified: Secondary | ICD-10-CM | POA: Insufficient documentation

## 2015-12-02 DIAGNOSIS — I519 Heart disease, unspecified: Secondary | ICD-10-CM | POA: Diagnosis present

## 2016-01-30 ENCOUNTER — Other Ambulatory Visit: Payer: Self-pay | Admitting: Cardiovascular Disease

## 2016-01-30 NOTE — Telephone Encounter (Signed)
Rx has been sent to the pharmacy electronically. ° °

## 2016-04-14 ENCOUNTER — Other Ambulatory Visit: Payer: Self-pay | Admitting: *Deleted

## 2016-04-14 DIAGNOSIS — R911 Solitary pulmonary nodule: Secondary | ICD-10-CM

## 2016-05-26 ENCOUNTER — Ambulatory Visit
Admission: RE | Admit: 2016-05-26 | Discharge: 2016-05-26 | Disposition: A | Discharge status: Home / Self Care | Payer: Medicare Other | Source: Ambulatory Visit | Attending: Thoracic Surgery (Cardiothoracic Vascular Surgery) | Admitting: Thoracic Surgery (Cardiothoracic Vascular Surgery)

## 2016-05-26 ENCOUNTER — Ambulatory Visit (INDEPENDENT_AMBULATORY_CARE_PROVIDER_SITE_OTHER): Payer: Medicare Other | Admitting: Thoracic Surgery (Cardiothoracic Vascular Surgery)

## 2016-05-26 ENCOUNTER — Encounter: Payer: Self-pay | Admitting: Thoracic Surgery (Cardiothoracic Vascular Surgery)

## 2016-05-26 VITALS — BP 146/83 | HR 74 | Resp 18 | Ht 62.0 in | Wt 217.6 lb

## 2016-05-26 DIAGNOSIS — Z853 Personal history of malignant neoplasm of breast: Secondary | ICD-10-CM

## 2016-05-26 DIAGNOSIS — R911 Solitary pulmonary nodule: Secondary | ICD-10-CM

## 2016-05-26 DIAGNOSIS — D381 Neoplasm of uncertain behavior of trachea, bronchus and lung: Secondary | ICD-10-CM

## 2016-05-26 NOTE — Progress Notes (Signed)
[address]       [place] [address]             [phone]       HPI: [name] returns today for follow-up of a right lung nodule.  She is a 80 year old woman who I have been following since 2013 for a right lower lobe lung nodule. This was a smooth well marginated nodule. It was 8 mm when originally noted. Over time it has shown a minimal increase in size. It was negative on PET back in 2013.  Over the past year she's been having some issues with her eyesight. She has glaucoma and macular degeneration. She has not had any problems with her breathing. She has not had any significant weight loss.  Past Medical History:  Diagnosis Date  . Breast cancer ([place])   . Cataracts, bilateral   . Glaucoma   . Hyperlipidemia   . Hypertension   . Macular degeneration   . Nodule of right lung   . Obesity   . Sleep apnea       Current Outpatient Prescriptions  Medication Sig Dispense Refill  . aspirin 81 MG tablet Take 81 mg by mouth daily.    . brimonidine (ALPHAGAN) 0.2 % ophthalmic solution Place 2 drops into both eyes daily.     . chlorthalidone (HYGROTON) 25 MG tablet TAKE 1/2 TO 1 TABLET DAILY AS DIRECTED 30 tablet 6  . cholecalciferol (VITAMIN D) 1000 UNITS tablet Take 5,000 Units by mouth daily.    . clotrimazole-betamethasone (LOTRISONE) cream Apply 1 application topically daily.     . diazepam (VALIUM) 5 MG tablet Take 5 mg by mouth as directed.     . dorzolamide-timolol (COSOPT) 22.3-6.8 MG/ML ophthalmic solution Place 1 drop into both eyes as directed.     . furosemide (LASIX) 40 MG tablet Take 20 mg by mouth daily.     [name] ketoconazole (NIZORAL) 2 % cream Apply 1 application topically at bedtime.     [name] latanoprost (XALATAN) 0.005 % ophthalmic solution Place 1 drop into both eyes at bedtime.    [name] levothyroxine (SYNTHROID, LEVOTHROID) 75 MCG tablet Take 75 mcg by mouth daily before breakfast.     . metroNIDAZOLE (METROGEL) 0.75 % gel Apply 1 application  topically at bedtime.    . multivitamin-lutein (OCUVITE-LUTEIN) CAPS Take 1 capsule by mouth daily.    [name] NITROSTAT 0.4 MG SL tablet Place 0.4 mg under the tongue every 5 (five) minutes as needed for chest pain.     . NON FORMULARY CPAP machine     . olmesartan (BENICAR) 40 MG tablet Take 1 tablet (40 mg total) by mouth daily. 30 tablet 6  . omeprazole (PRILOSEC) 20 MG capsule Take 20 mg by mouth every other day.     . prednisoLONE acetate (PRED FORTE) 1 % ophthalmic suspension Place 1 drop into the left eye 2 (two) times daily.    . rosuvastatin (CRESTOR) 10 MG tablet Take 10 mg by mouth. Monday and Friday     No current facility-administered medications for this visit.     Physical Exam BP (!) 146/83   Pulse 74   Resp 18   Ht 5\' 2"  (1.575 m)   Wt 217 lb 9.6 oz (98.7 kg)   SpO2 97% Comment: ON RA  BMI 39.80 kg/m  Obese 80 year old woman in no acute distress Alert and oriented 3 No cervical or supraclavicular adenopathy Lungs clear bilaterally  Diagnostic Tests:  CT CHEST WITHOUT CONTRAST  TECHNIQUE: Multidetector CT imaging of the chest was performed following the standard protocol without IV contrast.  COMPARISON:  CT chest of 05/21/2015  FINDINGS: Cardiovascular: The mid ascending thoracic aorta measures 38 mm in diameter. Pulmonary arteries segments are again noted to be prominent suggesting a degree of pulmonary arterial hypertension. Diffuse coronary artery calcifications are evident and the heart is mildly enlarged. No pericardial effusion is seen. Diffuse changes of thoracic aortic atherosclerosis are noted.  Mediastinum/Nodes: On this unenhanced study, no mediastinal or hilar adenopathy is seen. No axillary adenopathy is noted. Changes of prior right axillary nodal dissection are present.  Lungs/Pleura: On lung window images, the dominant nodule within the right lower lobe appears minimally larger measuring 10 mm in diameter compared to 9 mm on the prior  CT and 8 mm on the CT of 05/22/2014. This may represent a slow growing benign process, with malignancy highly unlikely. The tiny nodule in the posterior left lower lobe measuring 3 mm in diameter is completely stable. If the patient is considered high risk, 1 additional followup CT of the chest in 1 year may be helpful. A small calcified granuloma is noted in the posterior left lower lobe on image 48. Changes of radiation fibrosis again are noted in the anterior right upper lobe.  Upper Abdomen: The portion of the upper abdomen that is visualized is unremarkable. Surgical clips are noted from prior cholecystectomy.  Musculoskeletal: There is curvature of the thoracic spine convex to the right with diffuse degenerative change throughout the thoracic spine. No compression deformity is seen.  IMPRESSION: 1. The dominant nodule within the right lower lobe is minimally larger measuring 10 mm compared to 9 mm on the prior CT of 2016 and 8 mm in 2015. This most likely represents a slow-growing benign process with a malignancy unlikely. Consider followup CT of the T if the patient is considered high risk in 1 year. 2. Changes of pulmonary arterial hypertension. 3. Diffuse coronary artery calcifications. 4. Diffuse thoracic aortic atherosclerosis.   Electronically Signed   By: Ivar Drape M.D.   On: 05/26/2016 12:40  I personally reviewed the CT, with findings as noted above. Going back to 2013 the nodule has grown about 2 mm over 4 years.  Impression: Amy Hahn is a 80 year old woman with a right lower lobe lung nodule. This is slightly larger than it was a year ago and going back to 2013 has grown about 2 mm over 4 years. This is most likely benign lesion possibly a hamartoma. It is very unlikely to be a lung cancer. Since does continue to grow I do think we have to keep an eye on it this to be on the safe side. I do not think it has a high enough index of suspicion to warrant the  risk of a biopsy procedure.  Plan: Return in one year with CT chest  Melrose Nakayama, MD [facility] [phone]

## 2016-06-14 ENCOUNTER — Other Ambulatory Visit: Payer: Self-pay | Admitting: Cardiovascular Disease

## 2016-06-15 NOTE — Telephone Encounter (Signed)
Rx(s) sent to pharmacy electronically.  

## 2016-06-30 ENCOUNTER — Observation Stay (HOSPITAL_COMMUNITY)
Admission: EM | Admit: 2016-06-30 | Discharge: 2016-07-01 | Disposition: A | Discharge status: Home / Self Care | Payer: Medicare Other | Attending: Internal Medicine | Admitting: Internal Medicine

## 2016-06-30 ENCOUNTER — Encounter (HOSPITAL_COMMUNITY): Payer: Self-pay

## 2016-06-30 ENCOUNTER — Telehealth: Payer: Self-pay | Admitting: Cardiovascular Disease

## 2016-06-30 ENCOUNTER — Emergency Department (HOSPITAL_COMMUNITY): Payer: Medicare Other

## 2016-06-30 DIAGNOSIS — E785 Hyperlipidemia, unspecified: Secondary | ICD-10-CM | POA: Diagnosis not present

## 2016-06-30 DIAGNOSIS — Z853 Personal history of malignant neoplasm of breast: Secondary | ICD-10-CM | POA: Diagnosis not present

## 2016-06-30 DIAGNOSIS — Z7982 Long term (current) use of aspirin: Secondary | ICD-10-CM | POA: Diagnosis not present

## 2016-06-30 DIAGNOSIS — E669 Obesity, unspecified: Secondary | ICD-10-CM | POA: Diagnosis not present

## 2016-06-30 DIAGNOSIS — H409 Unspecified glaucoma: Secondary | ICD-10-CM | POA: Diagnosis not present

## 2016-06-30 DIAGNOSIS — I10 Essential (primary) hypertension: Secondary | ICD-10-CM | POA: Insufficient documentation

## 2016-06-30 DIAGNOSIS — R4781 Slurred speech: Secondary | ICD-10-CM | POA: Diagnosis not present

## 2016-06-30 DIAGNOSIS — Z6839 Body mass index (BMI) 39.0-39.9, adult: Secondary | ICD-10-CM | POA: Insufficient documentation

## 2016-06-30 DIAGNOSIS — Z9989 Dependence on other enabling machines and devices: Secondary | ICD-10-CM

## 2016-06-30 DIAGNOSIS — Z96653 Presence of artificial knee joint, bilateral: Secondary | ICD-10-CM | POA: Insufficient documentation

## 2016-06-30 DIAGNOSIS — R269 Unspecified abnormalities of gait and mobility: Secondary | ICD-10-CM | POA: Diagnosis not present

## 2016-06-30 DIAGNOSIS — R7989 Other specified abnormal findings of blood chemistry: Secondary | ICD-10-CM | POA: Diagnosis present

## 2016-06-30 DIAGNOSIS — G4733 Obstructive sleep apnea (adult) (pediatric): Secondary | ICD-10-CM | POA: Diagnosis not present

## 2016-06-30 DIAGNOSIS — Z880 Allergy status to penicillin: Secondary | ICD-10-CM | POA: Insufficient documentation

## 2016-06-30 DIAGNOSIS — E039 Hypothyroidism, unspecified: Secondary | ICD-10-CM | POA: Insufficient documentation

## 2016-06-30 DIAGNOSIS — N179 Acute kidney failure, unspecified: Secondary | ICD-10-CM | POA: Insufficient documentation

## 2016-06-30 DIAGNOSIS — R911 Solitary pulmonary nodule: Secondary | ICD-10-CM | POA: Diagnosis present

## 2016-06-30 DIAGNOSIS — E860 Dehydration: Secondary | ICD-10-CM | POA: Diagnosis not present

## 2016-06-30 DIAGNOSIS — Z87891 Personal history of nicotine dependence: Secondary | ICD-10-CM | POA: Insufficient documentation

## 2016-06-30 LAB — DIFFERENTIAL
BASOS ABS: 0 10*3/uL (ref 0.0–0.1)
Basophils Relative: 0 %
EOS PCT: 4 %
Eosinophils Absolute: 0.3 10*3/uL (ref 0.0–0.7)
LYMPHS PCT: 27 %
Lymphs Abs: 2.3 10*3/uL (ref 0.7–4.0)
Monocytes Absolute: 0.8 10*3/uL (ref 0.1–1.0)
Monocytes Relative: 9 %
NEUTROS ABS: 5.1 10*3/uL (ref 1.7–7.7)
NEUTROS PCT: 60 %

## 2016-06-30 LAB — I-STAT TROPONIN, ED: Troponin i, poc: 0.01 ng/mL (ref 0.00–0.08)

## 2016-06-30 LAB — CBC
HCT: 36 % (ref 36.0–46.0)
Hemoglobin: 11.3 g/dL — ABNORMAL LOW (ref 12.0–15.0)
MCH: 31.7 pg (ref 26.0–34.0)
MCHC: 31.4 g/dL (ref 30.0–36.0)
MCV: 101.1 fL — ABNORMAL HIGH (ref 78.0–100.0)
PLATELETS: 249 10*3/uL (ref 150–400)
RBC: 3.56 MIL/uL — AB (ref 3.87–5.11)
RDW: 13.6 % (ref 11.5–15.5)
WBC: 8.5 10*3/uL (ref 4.0–10.5)

## 2016-06-30 LAB — COMPREHENSIVE METABOLIC PANEL
ALBUMIN: 3.6 g/dL (ref 3.5–5.0)
ALK PHOS: 63 U/L (ref 38–126)
ALT: 15 U/L (ref 14–54)
ANION GAP: 9 (ref 5–15)
AST: 24 U/L (ref 15–41)
BUN: 35 mg/dL — ABNORMAL HIGH (ref 6–20)
CALCIUM: 10.6 mg/dL — AB (ref 8.9–10.3)
CO2: 28 mmol/L (ref 22–32)
Chloride: 102 mmol/L (ref 101–111)
Creatinine, Ser: 1.53 mg/dL — ABNORMAL HIGH (ref 0.44–1.00)
GFR calc Af Amer: 36 mL/min — ABNORMAL LOW (ref >60)
GFR calc non Af Amer: 31 mL/min — ABNORMAL LOW (ref >60)
GLUCOSE: 97 mg/dL (ref 65–99)
Potassium: 3.5 mmol/L (ref 3.5–5.1)
SODIUM: 139 mmol/L (ref 135–145)
Total Bilirubin: 0.8 mg/dL (ref 0.3–1.2)
Total Protein: 6.5 g/dL (ref 6.5–8.1)

## 2016-06-30 LAB — I-STAT CHEM 8, ED
BUN: 35 mg/dL — ABNORMAL HIGH (ref 6–20)
CHLORIDE: 101 mmol/L (ref 101–111)
Calcium, Ion: 1.33 mmol/L (ref 1.15–1.40)
Creatinine, Ser: 1.6 mg/dL — ABNORMAL HIGH (ref 0.44–1.00)
GLUCOSE: 93 mg/dL (ref 65–99)
HCT: 35 % — ABNORMAL LOW (ref 36.0–46.0)
Hemoglobin: 11.9 g/dL — ABNORMAL LOW (ref 12.0–15.0)
POTASSIUM: 3.4 mmol/L — AB (ref 3.5–5.1)
SODIUM: 141 mmol/L (ref 135–145)
TCO2: 28 mmol/L (ref 0–100)

## 2016-06-30 LAB — PROTIME-INR
INR: 1.09
PROTHROMBIN TIME: 14.1 s (ref 11.4–15.2)

## 2016-06-30 LAB — CBG MONITORING, ED: GLUCOSE-CAPILLARY: 100 mg/dL — AB (ref 65–99)

## 2016-06-30 LAB — APTT: APTT: 32 s (ref 24–36)

## 2016-06-30 LAB — TSH: TSH: 3.73 u[IU]/mL (ref 0.350–4.500)

## 2016-06-30 LAB — T4, FREE: FREE T4: 0.91 ng/dL (ref 0.61–1.12)

## 2016-06-30 MED ORDER — SODIUM CHLORIDE 0.9 % IV BOLUS (SEPSIS)
500.0000 mL | Freq: Once | INTRAVENOUS | Status: AC
  Administered 2016-06-30: 500 mL via INTRAVENOUS

## 2016-06-30 NOTE — ED Provider Notes (Signed)
[facility] DEPT Provider Note   CSN: [id number] Arrival date & time: 06/30/16  1612     History   Chief Complaint Chief Complaint  Patient presents with  . Aphasia  . Gait Problem    HPI Amy Hahn is a 80 y.o. female.  HPI Patient presents with 1 1/2-2 weeks of increasing lethargy, drowsiness, slow/slurred voice, unsteady gait. No recent medication changes. Patient is taking Valium every evening. Denies any recent fever or chills. No focal weakness or numbness. Denies any visual changes. Denies nausea, vomiting or diarrhea. Past Medical History:  Diagnosis Date  . Breast cancer ([place])   . Cataracts, bilateral   . Glaucoma   . Hyperlipidemia   . Hypertension   . Macular degeneration   . Nodule of right lung   . Obesity   . Sleep apnea     Patient Active Problem List   Diagnosis Date Noted  . Hyperlipidemia 09/23/2015  . Lung nodule, solitary 07/12/2012  . Breast cancer, right breast ([place]) 07/11/2012  . Carotid stenosis 06/19/2012  . Bradycardia 06/02/2012  . Dizziness 04/27/2012  . Chest pain 04/27/2012  . Palpitations 04/27/2012  . HYPOTHYROIDISM-IATROGENIC 10/17/2008  . HYPERTENSION, BENIGN 10/17/2008    Past Surgical History:  Procedure Laterality Date  . CATARACT EXTRACTION, BILATERAL    . CESAREAN SECTION    . TOTAL KNEE ARTHROPLASTY     x2    OB History    No data available       Home Medications    Prior to Admission medications   Medication Sig Start Date End Date Taking? Authorizing Provider  acetaminophen (TYLENOL) 500 MG tablet Take 500 mg by mouth every 6 (six) hours as needed for moderate pain.    Yes Historical Provider, MD  aspirin 81 MG tablet Take 81 mg by mouth daily.   Yes Historical Provider, MD  brimonidine (ALPHAGAN) 0.2 % ophthalmic solution Place 2 drops into both eyes daily.  03/15/15  Yes Historical Provider, MD  cetirizine (ZYRTEC) 10 MG tablet Take 10 mg by mouth daily.   Yes Historical Provider, MD  chlorthalidone  (HYGROTON) 25 MG tablet TAKE 1/2 TO 1 TABLET DAILY AS DIRECTED 01/30/16  Yes Lorretta Harp, MD  Cholecalciferol 5000 units TABS Take 5,000 Units by mouth every evening.   Yes Historical Provider, MD  diazepam (VALIUM) 5 MG tablet Take 5 mg by mouth every 8 (eight) hours as needed for anxiety or sedation.  03/24/12  Yes Historical Provider, MD  dorzolamide-timolol (COSOPT) 22.3-6.8 MG/ML ophthalmic solution Place 1 drop into both eyes 2 (two) times daily.  04/12/12  Yes Historical Provider, MD  furosemide (LASIX) 40 MG tablet Take 20 mg by mouth daily.    Yes Historical Provider, MD  latanoprost (XALATAN) 0.005 % ophthalmic solution Place 1 drop into both eyes at bedtime.   Yes Historical Provider, MD  levothyroxine (SYNTHROID, LEVOTHROID) 88 MCG tablet Take 88 mcg by mouth daily before breakfast.   Yes Historical Provider, MD  Multiple Vitamin (MULTIVITAMIN WITH MINERALS) TABS tablet Take 1 tablet by mouth daily.   Yes Historical Provider, MD  Multiple Vitamins-Minerals (PRESERVISION AREDS 2) CAPS Take 1 capsule by mouth 2 (two) times daily.   Yes Historical Provider, MD  naproxen sodium (ANAPROX) 220 MG tablet Take 220 mg by mouth daily as needed (pain).   Yes Historical Provider, MD  NITROSTAT 0.4 MG SL tablet Place 0.4 mg under the tongue every 5 (five) minutes as needed for chest pain.  06/29/12  Yes Historical Provider, MD  NON FORMULARY Inhale 1 application into the lungs at bedtime. CPAP machine    Yes Historical Provider, MD  olmesartan (BENICAR) 40 MG tablet TAKE 1 TABLET (40 MG TOTAL) BY MOUTH DAILY. 06/15/16  Yes Lorretta Harp, MD  omeprazole (PRILOSEC) 20 MG capsule Take 20 mg by mouth daily as needed (heartburn).  03/24/12  Yes Historical Provider, MD  prednisoLONE acetate (PRED FORTE) 1 % ophthalmic suspension Place 1 drop into the left eye 2 (two) times daily. 10/10/15  Yes Historical Provider, MD  rosuvastatin (CRESTOR) 20 MG tablet Take 20 mg by mouth every evening.   Yes Historical  Provider, MD  traZODone (DESYREL) 50 MG tablet Take 25-50 mg by mouth at bedtime as needed for sleep.   Yes Historical Provider, MD    Family History Family History  Problem Relation Age of Onset  . Diabetes Mother   . Macular degeneration Mother   . Heart attack Father   . Cancer Father     Colon and Prostate Cancer  . Diabetes Sister   . Diabetes Brother   . Heart attack Brother   . Diabetes Brother     Social History Social History  Substance Use Topics  . Smoking status: Former Smoker    Packs/day: 1.00    Types: Cigarettes    Start date: 08/09/1960  . Smokeless tobacco: Never Used  . Alcohol use No     Allergies   Amitriptyline; Edarbi [azilsartan]; Morphine and related; Amlodipine; Atenolol; Codeine; Doxazosin; Dyazide [hydrochlorothiazide w-triamterene]; Erythromycin; Labetalol; Levatol [penbutolol sulfate]; Losartan; Monopril [fosinopril]; Ace inhibitors; Ampicillin; Belviq [lorcaserin hcl]; Butrans [buprenorphine]; Clotrimazole-betamethasone; Elocon [mometasone furoate]; Latex; Penicillins; and Propranolol   Review of Systems Review of Systems  Constitutional: Positive for activity change and fatigue. Negative for appetite change, chills and fever.  Eyes: Negative for visual disturbance.  Respiratory: Negative for shortness of breath.   Cardiovascular: Negative for chest pain and leg swelling.  Gastrointestinal: Negative for abdominal pain, diarrhea, nausea and vomiting.  Genitourinary: Negative for dysuria and flank pain.  Musculoskeletal: Positive for gait problem. Negative for arthralgias, back pain and neck pain.  Skin: Negative for rash and wound.  Neurological: Positive for weakness (generalized). Negative for dizziness, syncope, light-headedness, numbness and headaches.  All other systems reviewed and are negative.    Physical Exam Updated Vital Signs BP 185/93   Pulse 61   Temp 97.5 F (36.4 C)   Resp 17   SpO2 100%   Physical Exam    Constitutional: She is oriented to person, place, and time. She appears well-developed and well-nourished. No distress.  HENT:  Head: Normocephalic and atraumatic.  Mouth/Throat: Oropharynx is clear and moist.  Loss of the lateral eyebrows bilaterally. Mild periorbital edema  Eyes: EOM are normal. Pupils are equal, round, and reactive to light.  Neck: Normal range of motion. Neck supple. No thyromegaly present.  Cardiovascular: Normal rate and regular rhythm.  Exam reveals no gallop and no friction rub.   No murmur heard. Pulmonary/Chest: Effort normal and breath sounds normal. No respiratory distress. She has no wheezes. She has no rales. She exhibits no tenderness.  Abdominal: Soft. Bowel sounds are normal. There is no tenderness. There is no rebound and no guarding.  Musculoskeletal: Normal range of motion. She exhibits edema. She exhibits no tenderness.  Mild bilateral pretibial edema.  Neurological: She is alert and oriented to person, place, and time.  Low voice. Intentional speech pattern. 5/5 motor in all extremities. Sensation is fully  intact. Unable to elicit DTRs.  Skin: Skin is warm and dry. Capillary refill takes less than 2 seconds. No rash noted. No erythema.  Skin is dry and scaly  Psychiatric: She has a normal mood and affect. Her behavior is normal.  Nursing note and vitals reviewed.    ED Treatments / Results  Labs (all labs ordered are listed, but only abnormal results are displayed) Labs Reviewed  CBC - Abnormal; Notable for the following:       Result Value   RBC 3.56 (*)    Hemoglobin 11.3 (*)    MCV 101.1 (*)    All other components within normal limits  COMPREHENSIVE METABOLIC PANEL - Abnormal; Notable for the following:    BUN 35 (*)    Creatinine, Ser 1.53 (*)    Calcium 10.6 (*)    GFR calc non Af Amer 31 (*)    GFR calc Af Amer 36 (*)    All other components within normal limits  CBG MONITORING, ED - Abnormal; Notable for the following:     Glucose-Capillary 100 (*)    All other components within normal limits  I-STAT CHEM 8, ED - Abnormal; Notable for the following:    Potassium 3.4 (*)    BUN 35 (*)    Creatinine, Ser 1.60 (*)    Hemoglobin 11.9 (*)    HCT 35.0 (*)    All other components within normal limits  PROTIME-INR  APTT  DIFFERENTIAL  TSH  T4, FREE  URINALYSIS, ROUTINE W REFLEX MICROSCOPIC (NOT AT [facility])  I-STAT TROPOININ, ED    EKG  EKG Interpretation  Date/Time:  Tuesday June 30 2016 16:42:08 EDT Ventricular Rate:  63 PR Interval:  214 QRS Duration: 90 QT Interval:  442 QTC Calculation: 452 R Axis:   -27 Text Interpretation:  Sinus rhythm with sinus arrhythmia with 1st degree A-V block Left ventricular hypertrophy Abnormal ECG Confirmed by Lita Mains  MD, Tamla Winkels ([id number]) on 06/30/2016 9:44:15 PM       Radiology Ct Head Wo Contrast  Result Date: 06/30/2016 CLINICAL DATA:  Aphasia and staggering for 5 days. EXAM: CT HEAD WITHOUT CONTRAST TECHNIQUE: Contiguous axial images were obtained from the base of the skull through the vertex without intravenous contrast. COMPARISON:  None. FINDINGS: Brain: No subdural, epidural, or subarachnoid hemorrhage. Cerebellum, brainstem, and basal cisterns are normal. No mass, mass effect, or midline shift. The ventricles are nondilated. Prominence of the sulci, are consistent with volume loss. Minimal white matter changes. No acute cortical ischemia or infarct identified. Vascular: Calcified atherosclerosis is seen in the intracranial portions of the carotid arteries. Skull: Normal. Negative for fracture or focal lesion. Sinuses/Orbits: No acute finding. Other: No other abnormalities. IMPRESSION: No acute abnormality. Electronically Signed   By: Dorise Bullion III M.D   On: 06/30/2016 17:54    Procedures Procedures (including critical care time)  Medications Ordered in ED Medications  sodium chloride 0.9 % bolus 500 mL (0 mLs Intravenous Stopped 06/30/16 2304)      Initial Impression / Assessment and Plan / ED Course  I have reviewed the triage vital signs and the nursing notes.  Pertinent labs & imaging results that were available during my care of the patient were reviewed by me and considered in my medical decision making (see chart for details).  Clinical Course    Discussed with neurology. Evaluating patient at bedside. Recommend observation admission and MRI. [facility] will admit.  Final Clinical Impressions(s) / ED Diagnoses   Final  diagnoses:  Gait abnormality  Elevated serum creatinine    New Prescriptions New Prescriptions   No medications on file     Julianne Rice, MD 07/01/16 0004

## 2016-06-30 NOTE — Progress Notes (Signed)
EDCM consulted [facility] CM to speak to patient/family regarding equipment.

## 2016-06-30 NOTE — Telephone Encounter (Signed)
Spoke to patient. She states the last 4-5 days dizziness, unsteady walking. Per patient family members - son and daughter- problem with her speech and left side of face slightly swollen.  patient states no chest discomfort or shortness of breathe.  recommend patient go to ER evaluation. Patient decline, wanted an appointment. appointment available,but RN asked to speak patient's son who was there with patient   patient gave phone to son Shanon Brow. RN spoke to son ,informed him if patient is having stroke like symptoms it is best she goes to ER for evaluation instead of an appointment. Shanon Brow verbalized understanding and states will go to ER.

## 2016-06-30 NOTE — Progress Notes (Signed)
PLEASE PLACE FACE TO [facility]!! THANK YOU!

## 2016-06-30 NOTE — ED Triage Notes (Signed)
Pt here with family and reports with aphasia as well as staggering gait X5 days. Pt reports possible hx of TIA. She denies headache today. Pt a&o X4.

## 2016-06-30 NOTE — Care Management Note (Addendum)
Case Management Note  Patient Details  Name: Amy Hahn MRN: [id number] Date of Birth: 07/19/1936  Subjective/Objective:                  Aphasia  Gait Problem  Action/Plan: CM spoke to patient at the bedside along with her spouse and daughter. Patient said that she is ready to go home but is not sure what the MD has decided. CM spoke to patient and family about [facility] needs and having PT and RN come in to evaluate and patient chose [facility]. Patient has a bsc at home but does not have a rolling walker. I explained that an order can be entered and she can pick up from [facility] tomorrow and the family said that would not be a problem. CM spoke to Dr. Lita Mains who is entering orders for RW along with [facility] PT and RN/face to face. Patient was seen up walking to the bathroom with min assist; gait steady but slow. She said that her husband and son will be there tonight to assist for any needs and will get up with her if she needs to ambulate, one on each side. She said that she has a walker at home that was a family member's but not a RW but can use that until RW obtained. She said that she has no further needs. CM faxed orders for DME, HH, and face sheet to [facility] care and confirmation received. Awaiting Face to Face to be completed.   Expected Discharge Date:                  Expected Discharge Plan:  [facility]  In-House Referral:     Discharge planning Services  CM Consult  Post Acute Care Choice:  Durable Medical Equipment, Home Health Choice offered to:  Patient, Spouse, Adult Children  DME Arranged:  Walker rolling DME Agency:  [facility] Arranged:  RN, PT [facility] Agency:  [facility]  Status of Service:  Completed, signed off  If discussed at [place] of Stay Meetings, dates discussed:    Additional Comments:  Guido Sander, RN 06/30/2016, 10:27 PM

## 2016-06-30 NOTE — Telephone Encounter (Signed)
New message   Pt verbalized that she is calling for the rn because she has been feeling very dizzy  Pt c/o Syncope: STAT if syncope occurred within 30 minutes and pt complains of lightheadedness High Priority if episode of passing out, completely, today or in last 24 hours   1. Did you pass out today? no  When is the last time you passed out? n/a Has this occurred multiple times? yes 2. Did you have any symptoms prior to passing out? Pt has not passed out she feels like she may

## 2016-07-01 ENCOUNTER — Observation Stay (HOSPITAL_COMMUNITY): Payer: Medicare Other

## 2016-07-01 DIAGNOSIS — R911 Solitary pulmonary nodule: Secondary | ICD-10-CM

## 2016-07-01 DIAGNOSIS — E039 Hypothyroidism, unspecified: Secondary | ICD-10-CM | POA: Diagnosis not present

## 2016-07-01 DIAGNOSIS — R269 Unspecified abnormalities of gait and mobility: Secondary | ICD-10-CM

## 2016-07-01 DIAGNOSIS — Z9989 Dependence on other enabling machines and devices: Secondary | ICD-10-CM

## 2016-07-01 DIAGNOSIS — G4733 Obstructive sleep apnea (adult) (pediatric): Secondary | ICD-10-CM

## 2016-07-01 DIAGNOSIS — R4781 Slurred speech: Secondary | ICD-10-CM | POA: Diagnosis not present

## 2016-07-01 DIAGNOSIS — I10 Essential (primary) hypertension: Secondary | ICD-10-CM

## 2016-07-01 LAB — BASIC METABOLIC PANEL
Anion gap: 4 — ABNORMAL LOW (ref 5–15)
BUN: 32 mg/dL — AB (ref 6–20)
CALCIUM: 10.1 mg/dL (ref 8.9–10.3)
CO2: 32 mmol/L (ref 22–32)
CREATININE: 1.4 mg/dL — AB (ref 0.44–1.00)
Chloride: 103 mmol/L (ref 101–111)
GFR calc non Af Amer: 35 mL/min — ABNORMAL LOW (ref >60)
GFR, EST AFRICAN AMERICAN: 41 mL/min — AB (ref >60)
Glucose, Bld: 129 mg/dL — ABNORMAL HIGH (ref 65–99)
Potassium: 3.3 mmol/L — ABNORMAL LOW (ref 3.5–5.1)
SODIUM: 139 mmol/L (ref 135–145)

## 2016-07-01 LAB — CBC
HEMATOCRIT: 34.9 % — AB (ref 36.0–46.0)
Hemoglobin: 10.9 g/dL — ABNORMAL LOW (ref 12.0–15.0)
MCH: 31.9 pg (ref 26.0–34.0)
MCHC: 31.2 g/dL (ref 30.0–36.0)
MCV: 102 fL — ABNORMAL HIGH (ref 78.0–100.0)
Platelets: 232 10*3/uL (ref 150–400)
RBC: 3.42 MIL/uL — ABNORMAL LOW (ref 3.87–5.11)
RDW: 13.9 % (ref 11.5–15.5)
WBC: 7.7 10*3/uL (ref 4.0–10.5)

## 2016-07-01 LAB — URINALYSIS, ROUTINE W REFLEX MICROSCOPIC
BILIRUBIN URINE: NEGATIVE
GLUCOSE, UA: NEGATIVE mg/dL
HGB URINE DIPSTICK: NEGATIVE
Ketones, ur: NEGATIVE mg/dL
Nitrite: NEGATIVE
PROTEIN: NEGATIVE mg/dL
Specific Gravity, Urine: 1.011 (ref 1.005–1.030)
pH: 7.5 (ref 5.0–8.0)

## 2016-07-01 LAB — URINE MICROSCOPIC-ADD ON

## 2016-07-01 LAB — AMMONIA: Ammonia: 13 umol/L (ref 9–35)

## 2016-07-01 LAB — VITAMIN B12: VITAMIN B 12: 687 pg/mL (ref 180–914)

## 2016-07-01 MED ORDER — ADULT MULTIVITAMIN W/MINERALS CH
1.0000 | ORAL_TABLET | Freq: Every day | ORAL | Status: DC
  Administered 2016-07-01: 1 via ORAL
  Filled 2016-07-01: qty 1

## 2016-07-01 MED ORDER — POTASSIUM CHLORIDE CRYS ER 20 MEQ PO TBCR
40.0000 meq | EXTENDED_RELEASE_TABLET | ORAL | Status: AC
  Administered 2016-07-01: 40 meq via ORAL
  Filled 2016-07-01: qty 2

## 2016-07-01 MED ORDER — TIMOLOL MALEATE 0.5 % OP SOLN
1.0000 [drp] | Freq: Two times a day (BID) | OPHTHALMIC | Status: DC
  Administered 2016-07-01: 1 [drp] via OPHTHALMIC
  Filled 2016-07-01: qty 5

## 2016-07-01 MED ORDER — STROKE: EARLY STAGES OF RECOVERY BOOK
Freq: Once | Status: DC
  Administered 2016-07-01: 07:00:00
  Filled 2016-07-01: qty 1

## 2016-07-01 MED ORDER — ROSUVASTATIN CALCIUM 20 MG PO TABS
20.0000 mg | ORAL_TABLET | Freq: Every evening | ORAL | Status: DC
  Filled 2016-07-01: qty 1

## 2016-07-01 MED ORDER — HYDRALAZINE HCL 20 MG/ML IJ SOLN: 10.0000 mg | INTRAMUSCULAR | Status: DC | PRN

## 2016-07-01 MED ORDER — LEVOTHYROXINE SODIUM 88 MCG PO TABS
88.0000 ug | ORAL_TABLET | Freq: Every day | ORAL | Status: DC
  Administered 2016-07-01: 88 ug via ORAL
  Filled 2016-07-01: qty 1

## 2016-07-01 MED ORDER — TRAZODONE HCL 50 MG PO TABS: 25.0000 mg | ORAL_TABLET | Freq: Every evening | ORAL | Status: DC | PRN

## 2016-07-01 MED ORDER — ENOXAPARIN SODIUM 40 MG/0.4ML ~~LOC~~ SOLN
40.0000 mg | SUBCUTANEOUS | Status: DC
  Administered 2016-07-01: 40 mg via SUBCUTANEOUS
  Filled 2016-07-01: qty 0.4

## 2016-07-01 MED ORDER — PROSIGHT PO TABS
1.0000 | ORAL_TABLET | Freq: Two times a day (BID) | ORAL | Status: DC
  Administered 2016-07-01: 1 via ORAL
  Filled 2016-07-01 (×2): qty 1

## 2016-07-01 MED ORDER — HYDRALAZINE HCL 10 MG PO TABS: 10.0000 mg | ORAL_TABLET | Freq: Three times a day (TID) | ORAL | 0 refills | Status: DC

## 2016-07-01 MED ORDER — DIAZEPAM 5 MG PO TABS: 5.0000 mg | ORAL_TABLET | Freq: Three times a day (TID) | ORAL | Status: DC | PRN

## 2016-07-01 MED ORDER — PANTOPRAZOLE SODIUM 40 MG PO TBEC
40.0000 mg | DELAYED_RELEASE_TABLET | Freq: Every day | ORAL | Status: DC
  Administered 2016-07-01: 40 mg via ORAL
  Filled 2016-07-01: qty 1

## 2016-07-01 MED ORDER — BRIMONIDINE TARTRATE 0.2 % OP SOLN
2.0000 [drp] | Freq: Every day | OPHTHALMIC | Status: DC
  Administered 2016-07-01: 2 [drp] via OPHTHALMIC
  Filled 2016-07-01: qty 5

## 2016-07-01 MED ORDER — PREDNISOLONE ACETATE 1 % OP SUSP
1.0000 [drp] | Freq: Two times a day (BID) | OPHTHALMIC | Status: DC
  Administered 2016-07-01: 1 [drp] via OPHTHALMIC
  Filled 2016-07-01: qty 1

## 2016-07-01 MED ORDER — SODIUM CHLORIDE 0.9 % IV SOLN
INTRAVENOUS | Status: DC
  Administered 2016-07-01: 05:00:00 via INTRAVENOUS

## 2016-07-01 MED ORDER — LATANOPROST 0.005 % OP SOLN
1.0000 [drp] | Freq: Every day | OPHTHALMIC | Status: DC
  Filled 2016-07-01: qty 2.5

## 2016-07-01 MED ORDER — DORZOLAMIDE HCL 2 % OP SOLN
1.0000 [drp] | Freq: Two times a day (BID) | OPHTHALMIC | Status: DC
Start: 2016-07-01 — End: 2016-07-01
  Administered 2016-07-01: 1 [drp] via OPHTHALMIC
  Filled 2016-07-01: qty 10

## 2016-07-01 MED ORDER — NITROGLYCERIN 0.4 MG SL SUBL: 0.4000 mg | SUBLINGUAL_TABLET | SUBLINGUAL | Status: DC | PRN

## 2016-07-01 MED ORDER — HYDRALAZINE HCL 10 MG PO TABS
10.0000 mg | ORAL_TABLET | Freq: Three times a day (TID) | ORAL | Status: DC
  Administered 2016-07-01: 10 mg via ORAL
  Filled 2016-07-01: qty 1

## 2016-07-01 MED ORDER — DORZOLAMIDE HCL-TIMOLOL MAL 2-0.5 % OP SOLN
1.0000 [drp] | Freq: Two times a day (BID) | OPHTHALMIC | Status: DC
Start: 2016-07-01 — End: 2016-07-01
  Filled 2016-07-01: qty 10

## 2016-07-01 MED ORDER — SENNOSIDES-DOCUSATE SODIUM 8.6-50 MG PO TABS
1.0000 | ORAL_TABLET | Freq: Every evening | ORAL | Status: DC | PRN
  Filled 2016-07-01: qty 1

## 2016-07-01 MED ORDER — ASPIRIN 81 MG PO CHEW
81.0000 mg | CHEWABLE_TABLET | Freq: Every day | ORAL | Status: DC
Start: 2016-07-01 — End: 2016-07-01
  Administered 2016-07-01: 81 mg via ORAL
  Filled 2016-07-01: qty 1

## 2016-07-01 NOTE — ED Notes (Signed)
Pt resting comfortably. VSS, NAD. Next neuro check due at 0600.

## 2016-07-01 NOTE — ED Notes (Signed)
Pt back from MRI 

## 2016-07-01 NOTE — Progress Notes (Signed)
Patient seen after midnight by Dr. Leonel Ramsay.   This morning she feels better. She states that she has been up and walked to the bathroom overnight and thinks that her gait has improved. MRI did not show any acute ischemia. Suspect recrudescence/exacerbation of old deficits in the setting of metabolic derangements. PT, supportive care.

## 2016-07-01 NOTE — ED Notes (Signed)
Pt placed back on bedside monitor per RN.

## 2016-07-01 NOTE — Care Management Obs Status (Signed)
[facility] NOTIFICATION   Patient Details  Name: Amy Hahn MRN: [id number] Date of Birth: 1936-05-22   Medicare Observation Status Notification Given:  Yes (MRI negative)    Pollie Friar, RN 07/01/2016, 11:28 AM

## 2016-07-01 NOTE — Evaluation (Signed)
Physical Therapy Evaluation Patient Details Name: Amy Hahn MRN: [id number] DOB: 1936-05-12 Today's Date: 07/01/2016   History of Present Illness   80 y.o. female with medical history significant of HTN, HLD, breast cancer, s/p b/l knee replacement; who presents with complaints of steady gait and slurred speech.  Clinical Impression  Pt admitted with above diagnosis. Pt currently with functional limitations due to the deficits listed below (see PT Problem List). On eval, pt demo mod I with bed mobility. She needed min guard assist with transfers and supervision ambulation with RW 200 feet. Pt will benefit from skilled PT to increase their independence and safety with mobility to allow discharge to the venue listed below.  PT to follow acutely but no f/u services at d/c indicated.      Follow Up Recommendations No PT follow up;Supervision - Intermittent    Equipment Recommendations  Rolling walker with 5" wheels    Recommendations for Other Services       Precautions / Restrictions Precautions Precautions: Fall      Mobility  Bed Mobility Overal bed mobility: Modified Independent                Transfers Overall transfer level: Needs assistance Equipment used: Ambulation equipment used Transfers: Sit to/from Stand;Stand Pivot Transfers Sit to Stand: Min guard Stand pivot transfers: Min guard       General transfer comment: min guard for safety only. No physical assist.  Ambulation/Gait Ambulation/Gait assistance: Supervision Ambulation Distance (Feet): 200 Feet Assistive device: Rolling walker (2 wheeled) Gait Pattern/deviations: Step-through pattern;Wide base of support;Decreased stride length Gait velocity: minimally decreased Gait velocity interpretation: Below normal speed for age/gender General Gait Details: Pt demo steady gait with RW. Also ambulated in hallway without A.D. Mild unsteadiness noted with pt seeking out HHA, IV pole, or handrail for support.  RW adds that needed stability for supervision level with ambulation.  Stairs            Wheelchair Mobility    Modified Rankin (Stroke Patients Only)       Balance Overall balance assessment: Needs assistance Sitting-balance support: No upper extremity supported;Feet supported Sitting balance-Leahy Scale: Good     Standing balance support: Single extremity supported;During functional activity Standing balance-Leahy Scale: Fair                               Pertinent Vitals/Pain Pain Assessment: No/denies pain    Home Living Family/patient expects to be discharged to:: Private residence Living Arrangements: Spouse/significant other;Children Available Help at Discharge: Family;Available 24 hours/day Type of Home: House Home Access: Stairs to enter Entrance Stairs-Rails: None Entrance Stairs-Number of Steps: 1 Home Layout: One level Home Equipment: Cane - single point      Prior Function Level of Independence: Independent               Hand Dominance   Dominant Hand: Right    Extremity/Trunk Assessment   Upper Extremity Assessment: Overall WFL for tasks assessed           Lower Extremity Assessment: Overall WFL for tasks assessed      Cervical / Trunk Assessment: Normal  Communication   Communication: No difficulties  Cognition Arousal/Alertness: Awake/alert Behavior During Therapy: WFL for tasks assessed/performed Overall Cognitive Status: Within Functional Limits for tasks assessed                      General Comments  Exercises     Assessment/Plan    PT Assessment Patient needs continued PT services  PT Problem List Decreased activity tolerance;Decreased balance;Decreased mobility;Decreased knowledge of use of DME          PT Treatment Interventions DME instruction;Gait training;Stair training;Functional mobility training;Balance training;Therapeutic exercise;Therapeutic activities    PT Goals  (Current goals can be found in the Care Plan section)  Acute Rehab PT Goals Patient Stated Goal: home PT Goal Formulation: With patient Time For Goal Achievement: 07/15/16 Potential to Achieve Goals: Good    Frequency Min 3X/week   Barriers to discharge        Co-evaluation               End of Session Equipment Utilized During Treatment: Gait belt Activity Tolerance: Patient tolerated treatment well Patient left: in chair;with nursing/sitter in room;with family/visitor present;with call bell/phone within reach Nurse Communication: Mobility status    Functional Assessment Tool Used: clinical judgement Functional Limitation: Mobility: Walking and moving around Mobility: Walking and Moving Around Current Status [phone]): At least 1 percent but less than 20 percent impaired, limited or restricted Mobility: Walking and Moving Around Goal Status [phone]): At least 1 percent but less than 20 percent impaired, limited or restricted    Time: [id number] PT Time Calculation (min) (ACUTE ONLY): 24 min   Charges:   PT Evaluation $PT Eval Moderate Complexity: 1 Procedure PT Treatments $Gait Training: 8-22 mins   PT G Codes:   PT G-Codes **NOT FOR INPATIENT CLASS** Functional Assessment Tool Used: clinical judgement Functional Limitation: Mobility: Walking and moving around Mobility: Walking and Moving Around Current Status [id number]): At least 1 percent but less than 20 percent impaired, limited or restricted Mobility: Walking and Moving Around Goal Status [phone]): At least 1 percent but less than 20 percent impaired, limited or restricted    Amy Hahn 07/01/2016, 10:49 AM

## 2016-07-01 NOTE — Progress Notes (Signed)
Pt being discharged from hospital per orders from MD. Pt and family educated on discharge instructions. Pt and family verbalized understanding of instructions. All questions and concerns were addressed. Pt's IV was removed prior to discharge. Pt exited hospital via wheelchair. 

## 2016-07-01 NOTE — H&P (Signed)
History and Physical    Amy Hahn [id number] DOB: 1936-05-03 DOA: 06/30/2016  Referring MD/NP/PA: Dr. Lita Mains PCP: [name], MD  Patient coming from: Home  Chief Complaint: Staggering gait and slurred speech  HPI: Amy Hahn is a 80 y.o. female with medical history significant of HTN, HLD, breast cancer, s/p b/l knee replacement; who presents with complaints of steady gait and slurred speech. The patient, daughter, and husband provide history. Symptoms initially started approximately 1-1/2 weeks ago. Family notes that the patient had become more slowed in her activities and speech. It was like she was slurring her words. During this time she had associated symptoms of staggering gait that was gradually worsening, generalized weakness, and urinary frequency. Patient felt as though she was staggering into walls. Denies any falls, dysuria, discharge, blood in stool or urine, shortness of breath. Patient normally administers all of her own medications.  ED Course: Upon admission patient was evaluated and seen to be afebrile with vital signs within normal limits. Lab work revealed BUN 35 and creatinine 1.53. Thyroid studies are noted to be within normal limits and UA was negative. She was evaluated by neurology who recommended MRI and continue stroke workup if positive. TRH called to admit to a telemetry bed. After pharmacy technicians reviewed her home medications patient and family recognized that she had been accidentally doubling up on her diuretics for an unknown amount of time. Patient states that she feels better after initial therapies of IV fluids given in the ED. Family notes that she is almost back to baseline.  Review of Systems: As per HPI otherwise 10 point review of systems negative.   Past Medical History:  Diagnosis Date  . Breast cancer ([place])   . Cataracts, bilateral   . Glaucoma   . Hyperlipidemia   . Hypertension   . Macular degeneration   . Nodule of right lung     . Obesity   . Sleep apnea     Past Surgical History:  Procedure Laterality Date  . CATARACT EXTRACTION, BILATERAL    . CESAREAN SECTION    . TOTAL KNEE ARTHROPLASTY     x2     reports that she has quit smoking. Her smoking use included Cigarettes. She started smoking about 55 years ago. She smoked 1.00 pack per day. She has never used smokeless tobacco. She reports that she does not drink alcohol or use drugs.  Allergies  Allergen Reactions  . Amitriptyline Other (See Comments)    Weakness   . Edarbi [Azilsartan] Other (See Comments)    Lupus  . Morphine And Related Other (See Comments)    "MAKES ME CRAZY"  . Amlodipine Other (See Comments)    Headache   . Atenolol Other (See Comments)    "decreases blood pressure"  . Codeine Nausea Only  . Doxazosin Other (See Comments)    Leg pain  . Dyazide [Hydrochlorothiazide W-Triamterene] Itching  . Erythromycin Diarrhea and Other (See Comments)    Also cramping  . Labetalol Other (See Comments)    Bradycardia   . Levatol [Penbutolol Sulfate] Other (See Comments)    Eye swelling  . Losartan Other (See Comments)    headache  . Monopril [Fosinopril] Hives  . Ace Inhibitors Cough  . Ampicillin Rash  . Belviq [Lorcaserin Hcl] Itching  . Butrans [Buprenorphine] Rash  . Clotrimazole-Betamethasone Rash  . Elocon [Mometasone Furoate] Rash  . Latex Rash    " IF ON ME FOR OVER 24 HOURS"  . Penicillins  Other (See Comments)    "in large doses"  . Propranolol Itching    Family History  Problem Relation Age of Onset  . Diabetes Mother   . Macular degeneration Mother   . Heart attack Father   . Cancer Father     Colon and Prostate Cancer  . Diabetes Sister   . Diabetes Brother   . Heart attack Brother   . Diabetes Brother     Prior to Admission medications   Medication Sig Start Date End Date Taking? Authorizing Provider  acetaminophen (TYLENOL) 500 MG tablet Take 500 mg by mouth every 6 (six) hours as needed for  moderate pain.    Yes Historical Provider, MD  aspirin 81 MG tablet Take 81 mg by mouth daily.   Yes Historical Provider, MD  brimonidine (ALPHAGAN) 0.2 % ophthalmic solution Place 2 drops into both eyes daily.  03/15/15  Yes Historical Provider, MD  cetirizine (ZYRTEC) 10 MG tablet Take 10 mg by mouth daily.   Yes Historical Provider, MD  chlorthalidone (HYGROTON) 25 MG tablet TAKE 1/2 TO 1 TABLET DAILY AS DIRECTED 01/30/16  Yes Lorretta Harp, MD  Cholecalciferol 5000 units TABS Take 5,000 Units by mouth every evening.   Yes Historical Provider, MD  diazepam (VALIUM) 5 MG tablet Take 5 mg by mouth every 8 (eight) hours as needed for anxiety or sedation.  03/24/12  Yes Historical Provider, MD  dorzolamide-timolol (COSOPT) 22.3-6.8 MG/ML ophthalmic solution Place 1 drop into both eyes 2 (two) times daily.  04/12/12  Yes Historical Provider, MD  furosemide (LASIX) 40 MG tablet Take 20 mg by mouth daily.    Yes Historical Provider, MD  latanoprost (XALATAN) 0.005 % ophthalmic solution Place 1 drop into both eyes at bedtime.   Yes Historical Provider, MD  levothyroxine (SYNTHROID, LEVOTHROID) 88 MCG tablet Take 88 mcg by mouth daily before breakfast.   Yes Historical Provider, MD  Multiple Vitamin (MULTIVITAMIN WITH MINERALS) TABS tablet Take 1 tablet by mouth daily.   Yes Historical Provider, MD  Multiple Vitamins-Minerals (PRESERVISION AREDS 2) CAPS Take 1 capsule by mouth 2 (two) times daily.   Yes Historical Provider, MD  naproxen sodium (ANAPROX) 220 MG tablet Take 220 mg by mouth daily as needed (pain).   Yes Historical Provider, MD  NITROSTAT 0.4 MG SL tablet Place 0.4 mg under the tongue every 5 (five) minutes as needed for chest pain.  06/29/12  Yes Historical Provider, MD  NON FORMULARY Inhale 1 application into the lungs at bedtime. CPAP machine    Yes Historical Provider, MD  olmesartan (BENICAR) 40 MG tablet TAKE 1 TABLET (40 MG TOTAL) BY MOUTH DAILY. 06/15/16  Yes Lorretta Harp, MD    omeprazole (PRILOSEC) 20 MG capsule Take 20 mg by mouth daily as needed (heartburn).  03/24/12  Yes Historical Provider, MD  prednisoLONE acetate (PRED FORTE) 1 % ophthalmic suspension Place 1 drop into the left eye 2 (two) times daily. 10/10/15  Yes Historical Provider, MD  rosuvastatin (CRESTOR) 20 MG tablet Take 20 mg by mouth every evening.   Yes Historical Provider, MD  traZODone (DESYREL) 50 MG tablet Take 25-50 mg by mouth at bedtime as needed for sleep.   Yes Historical Provider, MD    Physical Exam:    Constitutional:Obese elderly  NAD, calm, comfortable Vitals:   06/30/16 2100 06/30/16 2115 06/30/16 2130 06/30/16 2241  BP: 177/79 187/87 185/93   Pulse: 62 61 61   Resp:      Temp:  97.5 F (36.4 C)  TempSrc:      SpO2: 96% 100% 100%    Eyes: PERRL, lids and conjunctivae normal ENMT: Mucous membranes are moist. Posterior pharynx clear of any exudate or lesions.Normal dentition.  Neck: normal, supple, no masses, no thyromegaly Respiratory: clear to auscultation bilaterally, no wheezing, no crackles. Normal respiratory effort. No accessory muscle use.  Cardiovascular: Regular rate and rhythm, no murmurs / rubs / gallops.Trace lower extremity edema. 2+ pedal pulses. No carotid bruits.  Abdomen: no tenderness, no masses palpated. No hepatosplenomegaly. Bowel sounds positive.  Musculoskeletal: no clubbing / cyanosis. No joint deformity upper and lower extremities. Good ROM, no contractures. Normal muscle tone.  Skin: no rashes, lesions, ulcers. No induration. Healed scars from previous knee replacements.  Neurologic: CN 2-12 grossly intact. Sensation intact, DTR normal. Strength 5/5 in all 4.  Psychiatric: Normal judgment and insight. Alert and oriented x 3. Normal mood.     Labs on Admission: I have personally reviewed following labs and imaging studies  CBC:  Recent Labs Lab 06/30/16 1654 06/30/16 1708  WBC 8.5  --   NEUTROABS 5.1  --   HGB 11.3* 11.9*  HCT 36.0  35.0*  MCV 101.1*  --   PLT 249  --    Basic Metabolic Panel:  Recent Labs Lab 06/30/16 1654 06/30/16 1708  NA 139 141  K 3.5 3.4*  CL 102 101  CO2 28  --   GLUCOSE 97 93  BUN 35* 35*  CREATININE 1.53* 1.60*  CALCIUM 10.6*  --    GFR: CrCl cannot be calculated (Unknown ideal weight.). Liver Function Tests:  Recent Labs Lab 06/30/16 1654  AST 24  ALT 15  ALKPHOS 63  BILITOT 0.8  PROT 6.5  ALBUMIN 3.6   No results for input(s): LIPASE, AMYLASE in the last 168 hours. No results for input(s): AMMONIA in the last 168 hours. Coagulation Profile:  Recent Labs Lab 06/30/16 1654  INR 1.09   Cardiac Enzymes: No results for input(s): CKTOTAL, CKMB, CKMBINDEX, TROPONINI in the last 168 hours. BNP (last 3 results) No results for input(s): PROBNP in the last 8760 hours. HbA1C: No results for input(s): HGBA1C in the last 72 hours. CBG:  Recent Labs Lab 06/30/16 2047  GLUCAP 100*   Lipid Profile: No results for input(s): CHOL, HDL, LDLCALC, TRIG, CHOLHDL, LDLDIRECT in the last 72 hours. Thyroid Function Tests:  Recent Labs  06/30/16 2122  TSH 3.730  FREET4 0.91   Anemia Panel: No results for input(s): VITAMINB12, FOLATE, FERRITIN, TIBC, IRON, RETICCTPCT in the last 72 hours. Urine analysis:    Component Value Date/Time   COLORURINE YELLOW 06/30/2016 2330   APPEARANCEUR CLEAR 06/30/2016 2330   LABSPEC 1.011 06/30/2016 2330   PHURINE 7.5 06/30/2016 2330   GLUCOSEU NEGATIVE 06/30/2016 2330   HGBUR NEGATIVE 06/30/2016 2330   BILIRUBINUR NEGATIVE 06/30/2016 2330   KETONESUR NEGATIVE 06/30/2016 2330   PROTEINUR NEGATIVE 06/30/2016 2330   NITRITE NEGATIVE 06/30/2016 2330   LEUKOCYTESUR SMALL (A) 06/30/2016 2330   Sepsis Labs: No results found for this or any previous visit (from the past 240 hour(s)).   Radiological Exams on Admission: Ct Head Wo Contrast  Result Date: 06/30/2016 CLINICAL DATA:  Aphasia and staggering for 5 days. EXAM: CT HEAD WITHOUT  CONTRAST TECHNIQUE: Contiguous axial images were obtained from the base of the skull through the vertex without intravenous contrast. COMPARISON:  None. FINDINGS: Brain: No subdural, epidural, or subarachnoid hemorrhage. Cerebellum, brainstem, and basal cisterns are normal. No  mass, mass effect, or midline shift. The ventricles are nondilated. Prominence of the sulci, are consistent with volume loss. Minimal white matter changes. No acute cortical ischemia or infarct identified. Vascular: Calcified atherosclerosis is seen in the intracranial portions of the carotid arteries. Skull: Normal. Negative for fracture or focal lesion. Sinuses/Orbits: No acute finding. Other: No other abnormalities. IMPRESSION: No acute abnormality. Electronically Signed   By: Dorise Bullion III M.D   On: 06/30/2016 17:54    EKG: Independently reviewed. Sinus rhythm with first-degree AV block  Assessment/Plan Gait abnormality/ slurred speech: Acute. Given patient history initially question possibility of TIA/stroke. Initial CT scan of brain negative. Upon further investigation found that patient had been accidentally overusing diuretics. - Admit to a telemetry bed - Stroke order set was initiated - Follow-up MRI. If positive continue stroke workup, but  if negative no further workup needed. - Physical therapy to eval and treat in a.m. - Care management for possible need of home med monitoring  Acute kidney injury secondary to dehydration: patient previously with a baseline creatinine within normal limits. She presents with elevated BUN to creatinine ratio to give concern for prerenal cause. - IVF NS at 166ml/hr  - Repeat BMP in a.m. - Avoid nephrotoxic agents  Pulmonary nodule 8 mm incidental finding on chest x-ray/history of breast cancer: Acute.  - Consider CT scan of the chest for further evaluation given previous history of breast cancer  Essential HTN - Held chlorthalidone and Lasix - Hydralazine IV  prn  Hypothyroidism: Stable - Continue levothyroxine  OSA on CPAP - RT to supply CPAP   DVT prophylaxis: Lovenox   Code Status: Full Family Communication: *Discussed overall plan with the patient and family present at bedside. Disposition Plan: Likely discharge home Consults called: Neurology Admission status: Observation telemetry.  Norval Morton MD [facility] Pager [phone]  If 7PM-7AM, please contact night-coverage [email or web address] Password [email or web address]  07/01/2016, 1:08 AM

## 2016-07-01 NOTE — ED Notes (Signed)
Patient transported to MRI 

## 2016-07-01 NOTE — Care Management Note (Signed)
Case Management Note  Patient Details  Name: Amy Hahn MRN: [id number] Date of Birth: Oct 20, 1935  Subjective/Objective:                    Action/Plan: Patient discharging home today with [place] services. CM confirmed this with Santiago Glad at [facility]. Pt already has walker in the room. Will update the bedside RN.   Expected Discharge Date:  07/04/16               Expected Discharge Plan:  [place]  In-House Referral:     Discharge planning Services  CM Consult  Post Acute Care Choice:  Durable Medical Equipment, Home Health Choice offered to:  Patient, Spouse, Adult Children  DME Arranged:  Walker rolling DME Agency:  [place] Arranged:  RN, PT [facility] Agency:  [place]  Status of Service:  Completed, signed off  If discussed at [place] of Stay Meetings, dates discussed:    Additional Comments:  Pollie Friar, RN 07/01/2016, 11:36 AM

## 2016-07-01 NOTE — Discharge Summary (Signed)
Physician Discharge Summary   Patient ID: Amy Hahn MRN: [id number] DOB/AGE: 78-Jul-1937 78 y.o.  Admit date: 06/30/2016 Discharge date: 07/01/2016  Primary Care Physician:  [name], MD  Discharge Diagnoses:      Dehydration   acute renal insufficiency . Hypothyroidism . Lung nodule, solitary . OSA on CPAP . HYPERTENSION, BENIGN . Slurred speech   Consults: Neurology  Recommendations for Outpatient Follow-up:  1. Home health PT, RN to be arranged by case management 2. Please repeat CBC/BMET at next visit 3.  Pulmonary nodule 8 mm incidental finding on chest x-ray, please obtain nonemergent CT scan of the chest for further evaluation   DIET: Heart healthy diet    Allergies:   Allergies  Allergen Reactions  . Amitriptyline Other (See Comments)    Weakness   . Edarbi [Azilsartan] Other (See Comments)    Lupus  . Morphine And Related Other (See Comments)    "MAKES ME CRAZY"  . Amlodipine Other (See Comments)    Headache   . Atenolol Other (See Comments)    "decreases blood pressure"  . Codeine Nausea Only  . Doxazosin Other (See Comments)    Leg pain  . Dyazide [Hydrochlorothiazide W-Triamterene] Itching  . Erythromycin Diarrhea and Other (See Comments)    Also cramping  . Labetalol Other (See Comments)    Bradycardia   . Levatol [Penbutolol Sulfate] Other (See Comments)    Eye swelling  . Losartan Other (See Comments)    headache  . Monopril [Fosinopril] Hives  . Ace Inhibitors Cough  . Ampicillin Rash  . Belviq [Lorcaserin Hcl] Itching  . Butrans [Buprenorphine] Rash  . Clotrimazole-Betamethasone Rash  . Elocon [Mometasone Furoate] Rash  . Latex Rash    " IF ON ME FOR OVER 24 HOURS"  . Penicillins Other (See Comments)    "in large doses"  . Propranolol Itching     DISCHARGE MEDICATIONS: Current Discharge Medication List    START taking these medications   Details  hydrALAZINE (APRESOLINE) 10 MG tablet Take 1 tablet (10 mg total) by  mouth 3 (three) times daily. Qty: 90 tablet, Refills: 0      CONTINUE these medications which have NOT CHANGED   Details  acetaminophen (TYLENOL) 500 MG tablet Take 500 mg by mouth every 6 (six) hours as needed for moderate pain.     aspirin 81 MG tablet Take 81 mg by mouth daily.    brimonidine (ALPHAGAN) 0.2 % ophthalmic solution Place 2 drops into both eyes daily.     cetirizine (ZYRTEC) 10 MG tablet Take 10 mg by mouth daily.    Cholecalciferol 5000 units TABS Take 5,000 Units by mouth every evening.    diazepam (VALIUM) 5 MG tablet Take 5 mg by mouth every 8 (eight) hours as needed for anxiety or sedation.     dorzolamide-timolol (COSOPT) 22.3-6.8 MG/ML ophthalmic solution Place 1 drop into both eyes 2 (two) times daily.     latanoprost (XALATAN) 0.005 % ophthalmic solution Place 1 drop into both eyes at bedtime.    levothyroxine (SYNTHROID, LEVOTHROID) 88 MCG tablet Take 88 mcg by mouth daily before breakfast.    Multiple Vitamin (MULTIVITAMIN WITH MINERALS) TABS tablet Take 1 tablet by mouth daily.    Multiple Vitamins-Minerals (PRESERVISION AREDS 2) CAPS Take 1 capsule by mouth 2 (two) times daily.    NITROSTAT 0.4 MG SL tablet Place 0.4 mg under the tongue every 5 (five) minutes as needed for chest pain.     NON FORMULARY  Inhale 1 application into the lungs at bedtime. CPAP machine     omeprazole (PRILOSEC) 20 MG capsule Take 20 mg by mouth daily as needed (heartburn).     prednisoLONE acetate (PRED FORTE) 1 % ophthalmic suspension Place 1 drop into the left eye 2 (two) times daily.    rosuvastatin (CRESTOR) 20 MG tablet Take 20 mg by mouth every evening.    traZODone (DESYREL) 50 MG tablet Take 25-50 mg by mouth at bedtime as needed for sleep.      STOP taking these medications     chlorthalidone (HYGROTON) 25 MG tablet      furosemide (LASIX) 40 MG tablet      naproxen sodium (ANAPROX) 220 MG tablet      olmesartan (BENICAR) 40 MG tablet           Brief H and P: For complete details please refer to admission H and P, but in brief Amy Hahn is a 80 y.o. female with medical history significant of HTN, HLD, breast cancer, s/p b/l knee replacement; who presents with complaints of steady gait and slurred speech. The patient, daughter, and husband provide history. Symptoms initially started approximately 1-1/2 weeks ago. Family notes that the patient had become more slowed in her activities and speech. It was like she was slurring her words. During this time she had associated symptoms of staggering gait that was gradually worsening, generalized weakness, and urinary frequency. Patient felt as though she was staggering into walls. Denies any falls, dysuria, discharge, blood in stool or urine, shortness of breath. Patient normally administers all of her own medications. ED Course: Upon admission patient was evaluated and seen to be afebrile with vital signs within normal limits. Lab work revealed BUN 35 and creatinine 1.53. Thyroid studies are noted to be within normal limits and UA was negative. She was evaluated by neurology who recommended MRI and continue stroke workup if positive.  After pharmacy technicians reviewed her home medications, patient and family recognized that she had been accidentally doubling up on her diuretics for an unknown amount of time. Patient states that she feels better after initial therapies of IV fluids given in the ED. Family noted that she is almost back to baseline.  Hospital Course:   Gait abnormality/ slurred speech: Acute. Resolved.  Given patient history initially question possibility of TIA/stroke. Initial CT scan of brain negative. Upon further investigation found that patient had been accidentally overusing diuretics. - Patient was admitted for observation, MRI of the brain was obtained which was negative for CVA. Neurology was consulted who recommended no further workup if MRI was negative. - Patient was  started on IV fluids, PT evaluation was obtained, currently back to baseline, confirmed by family at the bedside.  Acute kidney injury secondary to dehydration: patient previously with a baseline creatinine within normal limits. She presented with elevated BUN to creatinine ratio to give concern for prerenal cause. - Patient was accidentally doubling up on her diuretics for an unknown amount of time, per the daughter patient takes her medications by herself. Patient was placed on IV fluids, creatinine was 1.6 at the time of admission, improved 1.4 at the time of discharge. - At this time, Lasix, Benicar, chlorthalidone has been placed on hold until follow-up appointment with Dr. Gwenlyn Found, patient's cardiologist. This was explained in detail to the patient's daughter who would make a follow-up appointment. For BP, patient was placed on hydralazine.  Pulmonary nodule 8 mm incidental finding on chest x-ray/history of breast  cancer: Acute.  - Consider CT scan of the chest for further evaluation given previous history of breast cancer, out  Essential HTN - Started on hydralazine. Hold Benicar, chlorthalidone and Lasix.  Hypothyroidism: Stable - Continue levothyroxine  OSA on CPAP  Day of Discharge BP (!) 180/76 (BP Location: Left Leg)   Pulse 63   Temp 98.5 F (36.9 C) (Oral)   Resp 17   Ht 5\' 2"  (1.575 m)   Wt 97.5 kg (214 lb 14.4 oz)   SpO2 96%   BMI 39.31 kg/m   Physical Exam: General: Alert and awake oriented x3 not in any acute distress. HEENT: anicteric sclera, pupils reactive to light and accommodation CVS: S1-S2 clear no murmur rubs or gallops Chest: clear to auscultation bilaterally, no wheezing rales or rhonchi Abdomen: soft nontender, nondistended, normal bowel sounds Extremities: no cyanosis, clubbing or edema noted bilaterally Neuro: Cranial nerves II-XII intact, no focal neurological deficits   The results of significant diagnostics from this hospitalization  (including imaging, microbiology, ancillary and laboratory) are listed below for reference.    LAB RESULTS: Basic Metabolic Panel:  Recent Labs Lab 06/30/16 1654 06/30/16 1708 07/01/16 0305  NA 139 141 139  K 3.5 3.4* 3.3*  CL 102 101 103  CO2 28  --  32  GLUCOSE 97 93 129*  BUN 35* 35* 32*  CREATININE 1.53* 1.60* 1.40*  CALCIUM 10.6*  --  10.1   Liver Function Tests:  Recent Labs Lab 06/30/16 1654  AST 24  ALT 15  ALKPHOS 63  BILITOT 0.8  PROT 6.5  ALBUMIN 3.6   No results for input(s): LIPASE, AMYLASE in the last 168 hours.  Recent Labs Lab 07/01/16 0040  AMMONIA 13   CBC:  Recent Labs Lab 06/30/16 1654 06/30/16 1708 07/01/16 0305  WBC 8.5  --  7.7  NEUTROABS 5.1  --   --   HGB 11.3* 11.9* 10.9*  HCT 36.0 35.0* 34.9*  MCV 101.1*  --  102.0*  PLT 249  --  232   Cardiac Enzymes: No results for input(s): CKTOTAL, CKMB, CKMBINDEX, TROPONINI in the last 168 hours. BNP: Invalid input(s): POCBNP CBG:  Recent Labs Lab 06/30/16 2047  GLUCAP 100*    Significant Diagnostic Studies:  Ct Head Wo Contrast  Result Date: 06/30/2016 CLINICAL DATA:  Aphasia and staggering for 5 days. EXAM: CT HEAD WITHOUT CONTRAST TECHNIQUE: Contiguous axial images were obtained from the base of the skull through the vertex without intravenous contrast. COMPARISON:  None. FINDINGS: Brain: No subdural, epidural, or subarachnoid hemorrhage. Cerebellum, brainstem, and basal cisterns are normal. No mass, mass effect, or midline shift. The ventricles are nondilated. Prominence of the sulci, are consistent with volume loss. Minimal white matter changes. No acute cortical ischemia or infarct identified. Vascular: Calcified atherosclerosis is seen in the intracranial portions of the carotid arteries. Skull: Normal. Negative for fracture or focal lesion. Sinuses/Orbits: No acute finding. Other: No other abnormalities. IMPRESSION: No acute abnormality. Electronically Signed   By: Dorise Bullion III M.D   On: 06/30/2016 17:54   Mr Brain Wo Contrast  Result Date: 07/01/2016 CLINICAL DATA:  2 weeks of increasing lethargy, drowsiness, slurred speech and unsteady gait. History of breast cancer, hypertension, hyperlipidemia. EXAM: MRI HEAD WITHOUT CONTRAST TECHNIQUE: Multiplanar, multiecho pulse sequences of the brain and surrounding structures were obtained without intravenous contrast. COMPARISON:  CT HEAD June 30, 2016 and MRI of the head April 26, 2009 FINDINGS: BRAIN: No reduced diffusion to suggest acute ischemia. No  susceptibility artifact to suggest hemorrhage. The ventricles and sulci are normal for patient's age. Old small LEFT cerebellar infarcts new from 2010. Patchy supratentorial white matter FLAIR T2 hyperintensities. No suspicious parenchymal signal, masses or mass effect. Faint discontinuous extra-axial FLAIR T2 hyperintense signal posteriorly favored as artifact, as not demonstrate on the remaining sequences. No extra-axial masses though, contrast enhanced sequences would be more sensitive. VASCULAR: Normal major intracranial vascular flow voids present at skull base. SKULL AND UPPER CERVICAL SPINE: No abnormal sellar expansion. No suspicious calvarial bone marrow signal. Craniocervical junction maintained. SINUSES/ORBITS: The mastoid air-cells and included paranasal sinuses are well-aerated. Status post bilateral ocular lens implants. The included ocular globes and orbital contents are non-suspicious. OTHER: Fatty replaced parotid glands. IMPRESSION: No acute intracranial process. Old small cerebellar infarcts. Mild chronic small vessel ischemic disease. Electronically Signed   By: Elon Alas M.D.   On: 07/01/2016 01:55    2D ECHO:   Disposition and Follow-up: Discharge Instructions    Diet - low sodium heart healthy    Complete by:  As directed    Discharge instructions    Complete by:  As directed    Please HOLD chlorthalidone, lasix (furosemide) and  benicar until your follow-up appointment with Dr Gwenlyn Found within 7-10days. You are started on new BP medication in the meantime, called hydralazine. If your BP starts to run high (above 160's) in the next few days, you may resume benicar.   Increase activity slowly    Complete by:  As directed        DISPOSITION: [facility] .   Why:  Home health for Physical therapy and Registered Nurse; they will call you within 24-48 hours of discharge. If you do not hear from them, please call number listed above.  Contact information: [place] [address] [place] .   Why:  You can go here to pick up [place] and the order was already faxed.  Contact information: [address] [place] [address] [phone]        Quay Burow, MD. Schedule an appointment as soon as possible for a visit in 10 day(s).   Specialties:  Cardiology, Radiology Why:  you will need labs to check your kidney function,BP medication adjustment Contact information: [address] [place] [address] [phone]            Time spent on Discharge: 25 minutes  Signed:   Kreg Earhart M.D. [facility] 07/01/2016, 11:52 AM Pager: [phone]

## 2016-07-01 NOTE — Consult Note (Signed)
Neurology Consultation Reason for Consult: Abnormal gait Referring Physician: Tamala Julian, R  CC: Abnormal gait  History is obtained from: Patient  HPI: Amy Hahn is a 80 y.o. female with a history of bilateral knee replacements who presents with worsening of her gait over the past  10 days. She states that she has had a cough for 3-4 weeks, but over the past 8-10 days she has noticed that she has been walking as well. She feels that it was a gradual decline. She now feels that she is staggering into the walls.  LKW: 8-10 days ago tpa given?: no, out of window    ROS: A 14 point ROS was performed and is negative except as noted in the HPI.   Past Medical History:  Diagnosis Date  . Breast cancer ([place])   . Cataracts, bilateral   . Glaucoma   . Hyperlipidemia   . Hypertension   . Macular degeneration   . Nodule of right lung   . Obesity   . Sleep apnea      Family History  Problem Relation Age of Onset  . Diabetes Mother   . Macular degeneration Mother   . Heart attack Father   . Cancer Father     Colon and Prostate Cancer  . Diabetes Sister   . Diabetes Brother   . Heart attack Brother   . Diabetes Brother      Social History:  reports that she has quit smoking. Her smoking use included Cigarettes. She started smoking about 55 years ago. She smoked 1.00 pack per day. She has never used smokeless tobacco. She reports that she does not drink alcohol or use drugs.   Exam: Current vital signs: BP 185/93   Pulse 61   Temp 97.5 F (36.4 C)   Resp 17   SpO2 100%  Vital signs in last 24 hours: Temp:  [97.5 F (36.4 C)-97.9 F (36.6 C)] 97.5 F (36.4 C) (09/26 2241) Pulse Rate:  [61-66] 61 (09/26 2130) Resp:  [17-18] 17 (09/26 1750) BP: (132-187)/(73-93) 185/93 (09/26 2130) SpO2:  [96 %-100 %] 100 % (09/26 2130)   Physical Exam  Constitutional: Appears well-developed and well-nourished.  Psych: Affect appropriate to situation Eyes: No scleral injection HENT:  No OP obstrucion Head: Normocephalic.  Cardiovascular: Normal rate and regular rhythm.  Respiratory: Effort normal and breath sounds normal to anterior ascultation GI: Soft.  No distension. There is no tenderness.  Skin: WDI  Neuro: Mental Status: Patient is awake, alert, oriented to person, place, month, year, and situation. Patient is able to give a clear and coherent history. No signs of aphasia or neglect Cranial Nerves: II: Visual Fields are full. Pupils are equal, round, and reactive to light.   III,IV, VI: EOMI without ptosis or diploplia.  V: Facial sensation is symmetric to temperature VII: Facial movement is symmetric.  VIII: hearing is intact to voice X: Uvula elevates symmetrically XI: Shoulder shrug is symmetric. XII: tongue is midline without atrophy or fasciculations.  Motor: Tone is normal. Bulk is normal. 5/5 strength was present in bilateral upper extremities, difficult question of whether she has some mild right leg weakness, but this is very mild.  Sensory: Sensation is intact to light touch Deep Tendon Reflexes: 2+ and symmetric in the biceps and diminished at the patellae Cerebellar: FNF  are intact bilaterally Gait: She has a wide-based gait and appears to favor her right leg   I have reviewed labs in epic and the results pertinent to  this consultation are: Normal thyroid  I have reviewed the images obtained: CT head-no acute findings  Impression: 80 year old female with worsening gait. My suspicion is that this is a multifactorial gait disorder due to general medical condition, including possible mild upper respiratory tract infection  renal insufficiency, and leg pain. Stroke is less likely given the progressive nature, but is still possible and she will need an MRI of her brain to rule this out. Also will check B12, ammonia as other metabolic derangements can also contribute to gait dysfunction.  Recommendations: 1) MRI brain, stroke workup if  positive 2) TSH, B12   Roland Rack, MD [facility] [phone]  If 7pm- 7am, please page neurology on call as listed in [place].

## 2016-07-07 ENCOUNTER — Ambulatory Visit (INDEPENDENT_AMBULATORY_CARE_PROVIDER_SITE_OTHER): Payer: Medicare Other | Admitting: Cardiovascular Disease

## 2016-07-07 ENCOUNTER — Encounter: Payer: Self-pay | Admitting: Cardiovascular Disease

## 2016-07-07 VITALS — BP 142/74 | HR 78 | Ht 62.0 in | Wt 220.0 lb

## 2016-07-07 DIAGNOSIS — I779 Disorder of arteries and arterioles, unspecified: Secondary | ICD-10-CM | POA: Diagnosis not present

## 2016-07-07 DIAGNOSIS — I10 Essential (primary) hypertension: Secondary | ICD-10-CM | POA: Diagnosis not present

## 2016-07-07 DIAGNOSIS — I6523 Occlusion and stenosis of bilateral carotid arteries: Secondary | ICD-10-CM | POA: Diagnosis not present

## 2016-07-07 DIAGNOSIS — I739 Peripheral vascular disease, unspecified: Secondary | ICD-10-CM

## 2016-07-07 NOTE — Assessment & Plan Note (Signed)
Amy Hahn was having atypical chest pain earlier this year. She had a low risk Myoview. She's had no recurrent symptoms.

## 2016-07-07 NOTE — Assessment & Plan Note (Signed)
History of hyperlipidemia on statin therapy followed by her PCP. 

## 2016-07-07 NOTE — Assessment & Plan Note (Signed)
History of mild left ICA stenosis by carotid Doppler study performed 05/22/13. We will recheck a carotid Doppler study

## 2016-07-07 NOTE — Progress Notes (Signed)
07/07/2016 Amy Hahn   [birth year]  [id number]  Primary Physician [name], MD Primary Cardiologist: Lorretta Harp MD Renae Gloss  HPI:  Amy Hahn is a 80 year old moderately overweight very Caucasian female mother of 69, grandmother and 3 grandchildren who i last saw in the office 11/19/15.[name] She was referred by Dr. Maudie Mercury for cardiovascular evaluation because of a recent episode of chest pain. Her risk factors include treated hypertension and hyperlipidemia as well as a strong family history of heart disease with a father who died of a myocardial infarction, a brother who's had stents and a son who's had a heart attack at age 56. She has never had a heart attack or stroke. She had recent nocturnal chest pain 2 days in a row resulting in ER evaluation. Her blood pressure has been high recently. Since I saw her approximately 6 weeks ago we obtained a Myoview stress test which was low risk although the ejection fraction was 40% by gated SPECT. After adjusting her antihypertensive medications for blood pressure came down nicely and her chest pain has resolved. She has done well since I last saw her in February of this year until recently when she was admitted with slurred speech and dehydration. Turns out that she was taking twice the dose of her oral diuretic. Her blood pressure medicines were adjusted and now she is only on hydralazine. She's had no recurrent symptoms.   Current Outpatient Prescriptions  Medication Sig Dispense Refill  . acetaminophen (TYLENOL) 500 MG tablet Take 500 mg by mouth every 6 (six) hours as needed for moderate pain.     [name] aspirin 81 MG tablet Take 81 mg by mouth daily.    . brimonidine (ALPHAGAN) 0.2 % ophthalmic solution Place 2 drops into both eyes daily.     . cetirizine (ZYRTEC) 10 MG tablet Take 10 mg by mouth daily.    . Cholecalciferol 5000 units TABS Take 5,000 Units by mouth every evening.    . diazepam (VALIUM) 5 MG tablet Take by mouth. Pt  takes 2.5 mg as needed for anxiety    . dorzolamide-timolol (COSOPT) 22.3-6.8 MG/ML ophthalmic solution Place 1 drop into both eyes 2 (two) times daily.     . hydrALAZINE (APRESOLINE) 10 MG tablet Take 1 tablet (10 mg total) by mouth 3 (three) times daily. 90 tablet 0  . latanoprost (XALATAN) 0.005 % ophthalmic solution Place 1 drop into both eyes at bedtime.    [name] levothyroxine (SYNTHROID, LEVOTHROID) 88 MCG tablet Take 88 mcg by mouth daily before breakfast.    . Multiple Vitamin (MULTIVITAMIN WITH MINERALS) TABS tablet Take 1 tablet by mouth daily.    . Multiple Vitamins-Minerals (PRESERVISION AREDS 2) CAPS Take 1 capsule by mouth 2 (two) times daily.    [name] NITROSTAT 0.4 MG SL tablet Place 0.4 mg under the tongue every 5 (five) minutes as needed for chest pain.     . NON FORMULARY Inhale 1 application into the lungs at bedtime. CPAP machine     . omeprazole (PRILOSEC) 20 MG capsule Take 20 mg by mouth daily as needed (heartburn).     . prednisoLONE acetate (PRED FORTE) 1 % ophthalmic suspension Place 1 drop into the left eye 2 (two) times daily.    . rosuvastatin (CRESTOR) 20 MG tablet Take 20 mg by mouth every evening.    . traZODone (DESYREL) 50 MG tablet Take 25-50 mg by mouth at bedtime as needed for sleep.  No current facility-administered medications for this visit.     Allergies  Allergen Reactions  . Amitriptyline Other (See Comments)    Weakness   . Edarbi [Azilsartan] Other (See Comments)    Lupus  . Morphine And Related Other (See Comments)    "MAKES ME CRAZY"  . Amlodipine Other (See Comments)    Headache   . Atenolol Other (See Comments)    "decreases blood pressure"  . Codeine Nausea Only  . Doxazosin Other (See Comments)    Leg pain  . Dyazide [Hydrochlorothiazide W-Triamterene] Itching  . Erythromycin Diarrhea and Other (See Comments)    Also cramping  . Labetalol Other (See Comments)    Bradycardia   . Levatol [Penbutolol Sulfate] Other (See Comments)     Eye swelling  . Losartan Other (See Comments)    headache  . Monopril [Fosinopril] Hives  . Ace Inhibitors Cough  . Ampicillin Rash  . Belviq [Lorcaserin Hcl] Itching  . Butrans [Buprenorphine] Rash  . Clotrimazole-Betamethasone Rash  . Elocon [Mometasone Furoate] Rash  . Latex Rash    " IF ON ME FOR OVER 24 HOURS"  . Penicillins Other (See Comments)    "in large doses"  . Propranolol Itching    Social History   Social History  . Marital status: Married    Spouse name: N/A  . Number of children: N/A  . Years of education: N/A   Occupational History  . Not on file.   Social History Main Topics  . Smoking status: Former Smoker    Packs/day: 1.00    Types: Cigarettes    Start date: 08/09/1960  . Smokeless tobacco: Never Used  . Alcohol use No  . Drug use: No  . Sexual activity: Not on file   Other Topics Concern  . Not on file   Social History Narrative  . No narrative on file     Review of Systems: General: negative for chills, fever, night sweats or weight changes.  Cardiovascular: negative for chest pain, dyspnea on exertion, edema, orthopnea, palpitations, paroxysmal nocturnal dyspnea or shortness of breath Dermatological: negative for rash Respiratory: negative for cough or wheezing Urologic: negative for hematuria Abdominal: negative for nausea, vomiting, diarrhea, bright red blood per rectum, melena, or hematemesis Neurologic: negative for visual changes, syncope, or dizziness All other systems reviewed and are otherwise negative except as noted above.    Blood pressure (!) 142/74, pulse 78, height 5\' 2"  (1.575 m), weight 220 lb (99.8 kg), SpO2 97 %.  General appearance: alert and no distress Neck: no adenopathy, no carotid bruit, no JVD, supple, symmetrical, trachea midline and thyroid not enlarged, symmetric, no tenderness/mass/nodules Lungs: clear to auscultation bilaterally Heart: regular rate and rhythm, S1, S2 normal, no murmur, click, rub or  gallop Extremities: extremities normal, atraumatic, no cyanosis or edema  EKG not performed today  ASSESSMENT AND PLAN:   HYPERTENSION, BENIGN History of hypertension currently only on hydralazine blood pressure measurement 142/74. Continue current meds at current dosing.  Chest pain Ms. Rothe was having atypical chest pain earlier this year. She had a low risk Myoview. She's had no recurrent symptoms.  Carotid stenosis History of mild left ICA stenosis by carotid Doppler study performed 05/22/13. We will recheck a carotid Doppler study  Hyperlipidemia History of hyperlipidemia on statin therapy followed by her PCP      Lorretta Harp MD [facility], [facility] 07/07/2016 8:07 AM

## 2016-07-07 NOTE — Assessment & Plan Note (Signed)
History of hypertension currently only on hydralazine blood pressure measurement 142/74. Continue current meds at current dosing.

## 2016-07-07 NOTE — Patient Instructions (Signed)
Medication Instructions:  NO CHANGES.  Labwork: PLEASE HAVE YOUR Labwork SENT from your primary care physician YOU HAVE UPCOMING.   Testing/Procedures: Your physician has requested that you have a carotid duplex. This test is an ultrasound of the carotid arteries in your neck. It looks at blood flow through these arteries that supply the brain with blood. Allow one hour for this exam. There are no restrictions or special instructions.    Follow-Up: Your physician wants you to follow-up in: [place]. You will receive a reminder letter in the mail two months in advance. If you don't receive a letter, please call our office to schedule the follow-up appointment.   If you need a refill on your cardiac medications before your next appointment, please call your pharmacy.

## 2016-07-29 ENCOUNTER — Ambulatory Visit (HOSPITAL_COMMUNITY)
Admission: RE | Admit: 2016-07-29 | Discharge: 2016-07-29 | Disposition: A | Discharge status: Home / Self Care | Payer: Medicare Other | Source: Ambulatory Visit | Attending: Cardiovascular Disease | Admitting: Cardiovascular Disease

## 2016-07-29 DIAGNOSIS — I779 Disorder of arteries and arterioles, unspecified: Secondary | ICD-10-CM | POA: Insufficient documentation

## 2016-07-29 DIAGNOSIS — I739 Peripheral vascular disease, unspecified: Secondary | ICD-10-CM

## 2016-07-29 DIAGNOSIS — I6523 Occlusion and stenosis of bilateral carotid arteries: Secondary | ICD-10-CM | POA: Diagnosis not present

## 2016-07-29 DIAGNOSIS — I10 Essential (primary) hypertension: Secondary | ICD-10-CM | POA: Diagnosis not present

## 2016-08-11 ENCOUNTER — Encounter (HOSPITAL_COMMUNITY): Payer: Self-pay | Admitting: Emergency Medicine

## 2016-08-11 ENCOUNTER — Emergency Department (HOSPITAL_COMMUNITY): Payer: Medicare Other

## 2016-08-11 ENCOUNTER — Inpatient Hospital Stay (HOSPITAL_COMMUNITY)
Admission: EM | Admit: 2016-08-11 | Discharge: 2016-08-13 | DRG: 292 | Disposition: A | Discharge status: Home / Self Care | Payer: Medicare Other | Attending: Cardiovascular Disease | Admitting: Cardiovascular Disease

## 2016-08-11 DIAGNOSIS — E669 Obesity, unspecified: Secondary | ICD-10-CM | POA: Diagnosis present

## 2016-08-11 DIAGNOSIS — Z96653 Presence of artificial knee joint, bilateral: Secondary | ICD-10-CM | POA: Diagnosis present

## 2016-08-11 DIAGNOSIS — I5021 Acute systolic (congestive) heart failure: Secondary | ICD-10-CM

## 2016-08-11 DIAGNOSIS — H409 Unspecified glaucoma: Secondary | ICD-10-CM | POA: Diagnosis present

## 2016-08-11 DIAGNOSIS — I4891 Unspecified atrial fibrillation: Secondary | ICD-10-CM

## 2016-08-11 DIAGNOSIS — E039 Hypothyroidism, unspecified: Secondary | ICD-10-CM | POA: Diagnosis present

## 2016-08-11 DIAGNOSIS — Z7982 Long term (current) use of aspirin: Secondary | ICD-10-CM

## 2016-08-11 DIAGNOSIS — Z87891 Personal history of nicotine dependence: Secondary | ICD-10-CM | POA: Diagnosis not present

## 2016-08-11 DIAGNOSIS — E876 Hypokalemia: Secondary | ICD-10-CM | POA: Diagnosis present

## 2016-08-11 DIAGNOSIS — I110 Hypertensive heart disease with heart failure: Principal | ICD-10-CM | POA: Diagnosis present

## 2016-08-11 DIAGNOSIS — Z8249 Family history of ischemic heart disease and other diseases of the circulatory system: Secondary | ICD-10-CM | POA: Diagnosis not present

## 2016-08-11 DIAGNOSIS — I248 Other forms of acute ischemic heart disease: Secondary | ICD-10-CM | POA: Diagnosis present

## 2016-08-11 DIAGNOSIS — Z91013 Allergy to seafood: Secondary | ICD-10-CM

## 2016-08-11 DIAGNOSIS — I4892 Unspecified atrial flutter: Secondary | ICD-10-CM | POA: Diagnosis present

## 2016-08-11 DIAGNOSIS — I5043 Acute on chronic combined systolic (congestive) and diastolic (congestive) heart failure: Secondary | ICD-10-CM | POA: Diagnosis present

## 2016-08-11 DIAGNOSIS — Z91040 Latex allergy status: Secondary | ICD-10-CM | POA: Diagnosis not present

## 2016-08-11 DIAGNOSIS — Z888 Allergy status to other drugs, medicaments and biological substances status: Secondary | ICD-10-CM

## 2016-08-11 DIAGNOSIS — E785 Hyperlipidemia, unspecified: Secondary | ICD-10-CM | POA: Diagnosis present

## 2016-08-11 DIAGNOSIS — Z79899 Other long term (current) drug therapy: Secondary | ICD-10-CM | POA: Diagnosis not present

## 2016-08-11 DIAGNOSIS — H3530 Unspecified macular degeneration: Secondary | ICD-10-CM | POA: Diagnosis present

## 2016-08-11 DIAGNOSIS — Z6836 Body mass index (BMI) 36.0-36.9, adult: Secondary | ICD-10-CM

## 2016-08-11 DIAGNOSIS — G4730 Sleep apnea, unspecified: Secondary | ICD-10-CM | POA: Diagnosis present

## 2016-08-11 DIAGNOSIS — Z881 Allergy status to other antibiotic agents status: Secondary | ICD-10-CM

## 2016-08-11 DIAGNOSIS — I491 Atrial premature depolarization: Secondary | ICD-10-CM

## 2016-08-11 DIAGNOSIS — R0602 Shortness of breath: Secondary | ICD-10-CM

## 2016-08-11 DIAGNOSIS — I5023 Acute on chronic systolic (congestive) heart failure: Secondary | ICD-10-CM

## 2016-08-11 HISTORY — DX: Adverse effect of unspecified anesthetic, initial encounter: T41.45XA

## 2016-08-11 HISTORY — DX: Gastro-esophageal reflux disease without esophagitis: K21.9

## 2016-08-11 HISTORY — DX: Personal history of urinary calculi: Z87.442

## 2016-08-11 HISTORY — DX: Other specified postprocedural states: R11.2

## 2016-08-11 HISTORY — DX: Other complications of anesthesia, initial encounter: T88.59XA

## 2016-08-11 HISTORY — DX: Other specified postprocedural states: Z98.890

## 2016-08-11 HISTORY — DX: Anxiety disorder, unspecified: F41.9

## 2016-08-11 LAB — BASIC METABOLIC PANEL
Anion gap: 8 (ref 5–15)
BUN: 16 mg/dL (ref 6–20)
CALCIUM: 9.8 mg/dL (ref 8.9–10.3)
CHLORIDE: 106 mmol/L (ref 101–111)
CO2: 24 mmol/L (ref 22–32)
CREATININE: 1.22 mg/dL — AB (ref 0.44–1.00)
GFR calc Af Amer: 48 mL/min — ABNORMAL LOW (ref >60)
GFR calc non Af Amer: 41 mL/min — ABNORMAL LOW (ref >60)
Glucose, Bld: 155 mg/dL — ABNORMAL HIGH (ref 65–99)
Potassium: 3 mmol/L — ABNORMAL LOW (ref 3.5–5.1)
SODIUM: 138 mmol/L (ref 135–145)

## 2016-08-11 LAB — BRAIN NATRIURETIC PEPTIDE: B Natriuretic Peptide: 1278.6 pg/mL — ABNORMAL HIGH (ref 0.0–100.0)

## 2016-08-11 LAB — CBC
HCT: 35.8 % — ABNORMAL LOW (ref 36.0–46.0)
HEMOGLOBIN: 11.3 g/dL — AB (ref 12.0–15.0)
MCH: 31.8 pg (ref 26.0–34.0)
MCHC: 31.6 g/dL (ref 30.0–36.0)
MCV: 100.8 fL — ABNORMAL HIGH (ref 78.0–100.0)
Platelets: 271 10*3/uL (ref 150–400)
RBC: 3.55 MIL/uL — ABNORMAL LOW (ref 3.87–5.11)
RDW: 14.7 % (ref 11.5–15.5)
WBC: 7 10*3/uL (ref 4.0–10.5)

## 2016-08-11 LAB — I-STAT TROPONIN, ED: TROPONIN I, POC: 0.05 ng/mL (ref 0.00–0.08)

## 2016-08-11 LAB — TROPONIN I
TROPONIN I: 0.1 ng/mL — AB (ref <0.03)
Troponin I: 0.11 ng/mL (ref <0.03)
Troponin I: 0.16 ng/mL (ref <0.03)

## 2016-08-11 LAB — HEPARIN LEVEL (UNFRACTIONATED): Heparin Unfractionated: 0.36 IU/mL (ref 0.30–0.70)

## 2016-08-11 LAB — MRSA PCR SCREENING: MRSA by PCR: NEGATIVE

## 2016-08-11 MED ORDER — HEPARIN BOLUS VIA INFUSION
4000.0000 [IU] | Freq: Once | INTRAVENOUS | Status: AC
  Administered 2016-08-11: 4000 [IU] via INTRAVENOUS
  Filled 2016-08-11: qty 4000

## 2016-08-11 MED ORDER — DILTIAZEM LOAD VIA INFUSION
15.0000 mg | Freq: Once | INTRAVENOUS | Status: DC
  Filled 2016-08-11: qty 15

## 2016-08-11 MED ORDER — TRAZODONE HCL 50 MG PO TABS: 25.0000 mg | ORAL_TABLET | Freq: Every evening | ORAL | Status: DC | PRN

## 2016-08-11 MED ORDER — LEVOTHYROXINE SODIUM 88 MCG PO TABS
88.0000 ug | ORAL_TABLET | Freq: Every day | ORAL | Status: DC
  Administered 2016-08-12 – 2016-08-13 (×2): 88 ug via ORAL
  Filled 2016-08-11 (×2): qty 1

## 2016-08-11 MED ORDER — LATANOPROST 0.005 % OP SOLN
1.0000 [drp] | Freq: Every day | OPHTHALMIC | Status: DC
  Administered 2016-08-11 – 2016-08-12 (×2): 1 [drp] via OPHTHALMIC
  Filled 2016-08-11: qty 2.5

## 2016-08-11 MED ORDER — DILTIAZEM HCL 100 MG IV SOLR
5.0000 mg/h | INTRAVENOUS | Status: DC
  Administered 2016-08-11: 5 mg/h via INTRAVENOUS
  Filled 2016-08-11 (×2): qty 100

## 2016-08-11 MED ORDER — LORATADINE 10 MG PO TABS
10.0000 mg | ORAL_TABLET | Freq: Every day | ORAL | Status: DC
  Administered 2016-08-12 – 2016-08-13 (×2): 10 mg via ORAL
  Filled 2016-08-11 (×2): qty 1

## 2016-08-11 MED ORDER — ROSUVASTATIN CALCIUM 10 MG PO TABS
20.0000 mg | ORAL_TABLET | Freq: Every evening | ORAL | Status: DC
  Administered 2016-08-11 – 2016-08-12 (×2): 20 mg via ORAL
  Filled 2016-08-11 (×2): qty 2

## 2016-08-11 MED ORDER — DIAZEPAM 5 MG PO TABS: 2.5000 mg | ORAL_TABLET | Freq: Four times a day (QID) | ORAL | Status: DC | PRN

## 2016-08-11 MED ORDER — FUROSEMIDE 10 MG/ML IJ SOLN
40.0000 mg | Freq: Two times a day (BID) | INTRAMUSCULAR | Status: DC
  Administered 2016-08-11 – 2016-08-13 (×5): 40 mg via INTRAVENOUS
  Filled 2016-08-11 (×6): qty 4

## 2016-08-11 MED ORDER — ASPIRIN 81 MG PO CHEW
324.0000 mg | CHEWABLE_TABLET | Freq: Once | ORAL | Status: AC
  Administered 2016-08-11: 324 mg via ORAL
  Filled 2016-08-11: qty 4

## 2016-08-11 MED ORDER — HEPARIN (PORCINE) IN NACL 100-0.45 UNIT/ML-% IJ SOLN
1050.0000 [IU]/h | INTRAMUSCULAR | Status: AC
Start: 2016-08-11 — End: 2016-08-12
  Administered 2016-08-11: 1100 [IU]/h via INTRAVENOUS
  Filled 2016-08-11 (×2): qty 250

## 2016-08-11 MED ORDER — POTASSIUM CHLORIDE CRYS ER 20 MEQ PO TBCR
40.0000 meq | EXTENDED_RELEASE_TABLET | Freq: Two times a day (BID) | ORAL | Status: DC
  Administered 2016-08-11 – 2016-08-13 (×5): 40 meq via ORAL
  Filled 2016-08-11 (×5): qty 2

## 2016-08-11 MED ORDER — DORZOLAMIDE HCL-TIMOLOL MAL 2-0.5 % OP SOLN
1.0000 [drp] | Freq: Two times a day (BID) | OPHTHALMIC | Status: DC
  Administered 2016-08-11 – 2016-08-13 (×4): 1 [drp] via OPHTHALMIC
  Filled 2016-08-11: qty 10

## 2016-08-11 MED ORDER — ACETAMINOPHEN 325 MG PO TABS: 650.0000 mg | ORAL_TABLET | ORAL | Status: DC | PRN

## 2016-08-11 MED ORDER — ONDANSETRON HCL 4 MG/2ML IJ SOLN: 4.0000 mg | Freq: Four times a day (QID) | INTRAMUSCULAR | Status: DC | PRN

## 2016-08-11 NOTE — Progress Notes (Signed)
ANTICOAGULATION CONSULT NOTE - Follow Up Consult  Pharmacy Consult for heparin Indication: atrial dysrhythmia  Patient Measurements: Height: 5' 2.5" (158.8 cm) Weight: 208 lb 9.6 oz (94.6 kg) IBW/kg (Calculated) : 51.25 Heparin Dosing Weight: 79.8  Vital Signs: Temp: 98.7 F (37.1 C) (11/07 2023) Temp Source: Oral (11/07 2023) BP: 119/89 (11/07 2023) Pulse Rate: 90 (11/07 2023)  Labs:  Recent Labs  08/11/16 0438 08/11/16 0954 08/11/16 1533 08/11/16 2052  HGB 11.3*  --   --   --   HCT 35.8*  --   --   --   PLT 271  --   --   --   HEPARINUNFRC  --   --   --  0.36  CREATININE 1.22*  --   --   --   TROPONINI  --  0.11* 0.16* 0.10*   Assessment: 80 yo F with acute combined systolic and diastolic heart failure. Pharmacy consulted to dose heparin for atrial dysrhythmia, atypical aflutter vs atrial tachycardia.  To get formal EP eval.  Not on any anticoagulants PTA.  Creat 1.22, Hg 11.3, pltc WNL.    Heparin Level Therapeutic: 0.36  Goal of Therapy:  Heparin level 0.3-0.7 units/ml Monitor platelets by anticoagulation protocol: Yes   Plan:  Continue heparin infusion at 1100 units/hr Check anti-Xa daily while on heparin Continue to monitor H&H and platelets  Lyla Jasek L Amar Sippel 08/11/2016,10:10 PM

## 2016-08-11 NOTE — ED Notes (Signed)
Attempted report x1. 

## 2016-08-11 NOTE — ED Provider Notes (Addendum)
[facility] DEPT Provider Note   CSN: [id number] Arrival date & time: 08/11/16  0351  By signing my name below, I, Amy Hahn, attest that this documentation has been prepared under the direction and in the presence of Amy Hacker, MD  Electronically Signed: Delton Hahn, ED Scribe. 08/11/16. 4:04 AM.   History   Chief Complaint Chief Complaint  Patient presents with  . Palpitations  . Shortness of Breath   The history is provided by the patient. No language interpreter was used.   HPI Comments:  Amy Hahn is a 80 y.o. female who presents to the Emergency Department complaining of intermittent episodes of palpitations x 4 days. Pt notes associated cough, SOB and chest pressure with "8/10" severity. Symptoms acutely worsen tonight. Shortness of breath is worse with laying flat. Pt denies a hx of A-fib, hx of similar symptoms, leg swelling and a hx of blood clots. No alleviating factors noted. Pt denies any other symptoms or complaints at this time.  Patient's primary cardiologist is Quay Burow.  She had a low risk Myoview in 2016 but was noted to have a reduced EF. No history of atrial fibrillation based on chart review.  Past Medical History:  Diagnosis Date  . Breast cancer ([place])   . Cataracts, bilateral   . Glaucoma   . Hyperlipidemia   . Hypertension   . Macular degeneration   . Nodule of right lung   . Obesity   . Sleep apnea     Patient Active Problem List   Diagnosis Date Noted  . Gait abnormality 07/01/2016  . OSA on CPAP 07/01/2016  . Slurred speech 07/01/2016  . Hyperlipidemia 09/23/2015  . Lung nodule, solitary 07/12/2012  . Breast cancer, right breast ([place]) 07/11/2012  . Carotid stenosis 06/19/2012  . Bradycardia 06/02/2012  . Dizziness 04/27/2012  . Chest pain 04/27/2012  . Palpitations 04/27/2012  . Hypothyroidism 10/17/2008  . HYPERTENSION, BENIGN 10/17/2008    Past Surgical History:  Procedure Laterality Date  . CATARACT  EXTRACTION, BILATERAL    . CESAREAN SECTION    . TOTAL KNEE ARTHROPLASTY     x2    OB History    No data available       Home Medications    Prior to Admission medications   Medication Sig Start Date End Date Taking? Authorizing Provider  acetaminophen (TYLENOL) 500 MG tablet Take 500 mg by mouth every 6 (six) hours as needed for moderate pain.    Yes Historical Provider, MD  aspirin 81 MG tablet Take 81 mg by mouth daily.   Yes Historical Provider, MD  brimonidine (ALPHAGAN) 0.2 % ophthalmic solution Place 2 drops into both eyes daily.  03/15/15  Yes Historical Provider, MD  cetirizine (ZYRTEC) 10 MG tablet Take 10 mg by mouth daily.   Yes Historical Provider, MD  Cholecalciferol 5000 units TABS Take 5,000 Units by mouth every evening.   Yes Historical Provider, MD  diazepam (VALIUM) 5 MG tablet Take 2.5 mg by mouth every 6 (six) hours as needed for anxiety or sedation.  03/24/12  Yes Historical Provider, MD  dorzolamide-timolol (COSOPT) 22.3-6.8 MG/ML ophthalmic solution Place 1 drop into both eyes 2 (two) times daily.  04/12/12  Yes Historical Provider, MD  hydrALAZINE (APRESOLINE) 10 MG tablet Take 1 tablet (10 mg total) by mouth 3 (three) times daily. 07/01/16  Yes Ripudeep K Rai, MD  latanoprost (XALATAN) 0.005 % ophthalmic solution Place 1 drop into both eyes at bedtime.   Yes Historical  Provider, MD  levothyroxine (SYNTHROID, LEVOTHROID) 88 MCG tablet Take 88 mcg by mouth daily before breakfast.   Yes Historical Provider, MD  Multiple Vitamin (MULTIVITAMIN WITH MINERALS) TABS tablet Take 1 tablet by mouth daily.   Yes Historical Provider, MD  Multiple Vitamins-Minerals (PRESERVISION AREDS 2) CAPS Take 1 capsule by mouth 2 (two) times daily.   Yes Historical Provider, MD  NITROSTAT 0.4 MG SL tablet Place 0.4 mg under the tongue every 5 (five) minutes as needed for chest pain.  06/29/12  Yes Historical Provider, MD  omeprazole (PRILOSEC) 20 MG capsule Take 20 mg by mouth daily as needed  (heartburn).  03/24/12  Yes Historical Provider, MD  prednisoLONE acetate (PRED FORTE) 1 % ophthalmic suspension Place 1 drop into the left eye 2 (two) times daily. 10/10/15  Yes Historical Provider, MD  rosuvastatin (CRESTOR) 20 MG tablet Take 20 mg by mouth every evening.   Yes Historical Provider, MD  traZODone (DESYREL) 50 MG tablet Take 25-50 mg by mouth at bedtime as needed for sleep.   Yes Historical Provider, MD  NON FORMULARY Inhale 1 application into the lungs at bedtime. CPAP machine     Historical Provider, MD    Family History Family History  Problem Relation Age of Onset  . Diabetes Mother   . Macular degeneration Mother   . Heart attack Father   . Cancer Father     Colon and Prostate Cancer  . Diabetes Sister   . Diabetes Brother   . Heart attack Brother   . Diabetes Brother     Social History Social History  Substance Use Topics  . Smoking status: Former Smoker    Packs/day: 1.00    Types: Cigarettes    Start date: 08/09/1960  . Smokeless tobacco: Never Used  . Alcohol use No     Allergies   Amitriptyline; Edarbi [azilsartan]; Morphine and related; Amlodipine; Atenolol; Codeine; Doxazosin; Dyazide [hydrochlorothiazide w-triamterene]; Erythromycin; Labetalol; Levatol [penbutolol sulfate]; Losartan; Monopril [fosinopril]; Ace inhibitors; Ampicillin; Belviq [lorcaserin hcl]; Butrans [buprenorphine]; Clotrimazole-betamethasone; Elocon [mometasone furoate]; Latex; Penicillins; and Propranolol   Review of Systems Review of Systems  Constitutional: Negative for fever.  Respiratory: Positive for cough and shortness of breath.   Cardiovascular: Positive for chest pain and palpitations. Negative for leg swelling.  All other systems reviewed and are negative.  Physical Exam Updated Vital Signs BP 168/99   Pulse 78   Temp 98.3 F (36.8 C) (Oral)   Resp 20   Ht 5\' 5"  (1.651 m)   Wt 220 lb (99.8 kg)   SpO2 98%   BMI 36.61 kg/m   Physical Exam  Constitutional:  She is oriented to person, place, and time. She appears well-developed and well-nourished.  Overweight  HENT:  Head: Normocephalic and atraumatic.  Cardiovascular: Normal heart sounds.   Irregularly irregular rhythm, tachycardia  Pulmonary/Chest: Effort normal. No respiratory distress. She has no wheezes.  Fine crackles bilaterally  Abdominal: Soft. Bowel sounds are normal. There is no tenderness. There is no guarding.  Musculoskeletal: She exhibits edema.  Neurological: She is alert and oriented to person, place, and time.  Skin: Skin is warm and dry.  Psychiatric: She has a normal mood and affect.  Nursing note and vitals reviewed.    ED Treatments / Results  DIAGNOSTIC STUDIES:  Oxygen Saturation is 93% on [place], adequate by my interpretation.    COORDINATION OF CARE:  4:03 AM Discussed treatment plan with pt at bedside and pt agreed to plan.  Labs (all labs  ordered are listed, but only abnormal results are displayed) Labs Reviewed  CBC - Abnormal; Notable for the following:       Result Value   RBC 3.55 (*)    Hemoglobin 11.3 (*)    HCT 35.8 (*)    MCV 100.8 (*)    All other components within normal limits  BASIC METABOLIC PANEL - Abnormal; Notable for the following:    Potassium 3.0 (*)    Glucose, Bld 155 (*)    Creatinine, Ser 1.22 (*)    GFR calc non Af Amer 41 (*)    GFR calc Af Amer 48 (*)    All other components within normal limits  BRAIN NATRIURETIC PEPTIDE - Abnormal; Notable for the following:    B Natriuretic Peptide 1,278.6 (*)    All other components within normal limits  I-STAT TROPOININ, ED    EKG  EKG Interpretation  Date/Time:  Tuesday August 11 2016 03:58:33 EST Ventricular Rate:  125 PR Interval:    QRS Duration: 93 QT Interval:  296 QTC Calculation: 427 R Axis:   -42 Text Interpretation:  Atrial fibrillation Ventricular premature complex Probable left ventricular hypertrophy Anterior Q waves, possibly due to LVH ST elevation, consider  inferior injury Atrial fibrillation , new Confirmed by Dina Rich  MD, COURTNEY ([id number]) on 08/11/2016 5:19:58 AM       Radiology Dg Chest Port 1 View  Result Date: 08/11/2016 CLINICAL DATA:  Atrial fibrillation tonight. Chest pressure and shortness of breath. EXAM: PORTABLE CHEST 1 VIEW COMPARISON:  07/11/2012 FINDINGS: Cardiac enlargement with mild pulmonary vascular congestion. Peripheral interstitial changes suggesting interstitial edema. No blunting of costophrenic angles. No pneumothorax. No focal consolidation. Postoperative right mastectomy with surgical clips in the right axilla. Calcified and tortuous aorta. Degenerative changes in the thoracic spine with thoracic scoliosis convex towards the right. IMPRESSION: Cardiac enlargement with mild pulmonary vascular congestion and interstitial edema. Electronically Signed   By: Lucienne Capers M.D.   On: 08/11/2016 04:17    Procedures Procedures (including critical care time)  Medications Ordered in ED Medications  diltiazem (CARDIZEM) 1 mg/mL load via infusion 15 mg (0 mg Intravenous Hold 08/11/16 0506)    And  diltiazem (CARDIZEM) 100 mg in dextrose 5 % 100 mL (1 mg/mL) infusion (5 mg/hr Intravenous New Bag/Given 08/11/16 0513)  potassium chloride SA (K-DUR,KLOR-CON) CR tablet 40 mEq (40 mEq Oral Given 08/11/16 0531)  aspirin chewable tablet 324 mg (324 mg Oral Given 08/11/16 0532)     Initial Impression / Assessment and Plan / ED Course  I have reviewed the triage vital signs and the nursing notes.  Pertinent labs & imaging results that were available during my care of the patient were reviewed by me and considered in my medical decision making (see chart for details).  Clinical Course     Patient presents with palpitations, chest pain, shortness of breath. Found to be in atrial fibrillation with RVR. No history of the same. Chads VASC is greater than 2.  Patient given aspirin. Started on diltiazem. She has vascular congestion and edema  on chest x-ray are likely related to her CHF and current atrial fib.  Troponin negative. Will discuss with cardiology. Patient will need admission for ongoing symptom management.  7:22 AM Discussed with Dr. Burt Knack. They will evaluate the patient for admission. Symptoms have improved on diltiazem drip. Blood pressure improved as well as heart rate. BNP is also elevated. Suspect exacerbation of heart failure secondary to A. fib with RVR.  Final  Clinical Impressions(s) / ED Diagnoses   Final diagnoses:  Atrial fibrillation with RVR (HCC)  SOB (shortness of breath)  Acute on chronic systolic heart failure (HCC)    New Prescriptions New Prescriptions   No medications on file   I personally performed the services described in this documentation, which was scribed in my presence. The recorded information has been reviewed and is accurate.    Amy Hacker, MD 08/11/16 [id number]    Amy Hacker, MD 08/11/16 [phone]

## 2016-08-11 NOTE — ED Triage Notes (Signed)
Pt brought to ED by EMS for new onset of A fib and SOB, pt states this started on Friday and not got no relief since then, pt unable sleep lying down since Friday, very SOB with minimal activity. Pt states she is having some 8/10 chest pressure at this time.

## 2016-08-11 NOTE — Progress Notes (Signed)
Pt noted to be in a-flutter, EKG done and in chart, heart rate stable in 70s and 80s, BP stable.  Edward Qualia RN

## 2016-08-11 NOTE — Consult Note (Signed)
ELECTROPHYSIOLOGY CONSULT NOTE    Patient ID: Amy Hahn MRN: [id number], DOB/AGE: 1936-02-15 80 y.o.  Admit date: 08/11/2016 Date of Consult: 08/11/2016   Primary Physician: [name], MD Primary Cardiologist: Dr. Gwenlyn Found Requesting MD: Dr. Burt Knack  Reason for Consultation: tachycardia  HPI: Amy Hahn is a 80 y.o. female with PMHx of HTN, HLD, obesity, sleep apnea comes to [facility] with c/o CP, palpitations and SOB.  Particularly the patient reports SOB being the primary symptom, worsening over the last couple days including symptoms of orthopnea.  She was lasts een by Dr. Gwenlyn Found out patient in Feb, noting at that time she had a negative ischemic w/u for c/o CP with stress testing and felt her HTN was the etiology and gained control with resolution of symptoms at that time.  More recently hospitalized late Sept secondary to hypotension, dehydration/ARF secondary to having been inadvertently doubling her BP.diuretic medicine.  She presented to the ER in what was suspect to be AFib with FVR and started on diltiazem gtt and heparin gtt, EP is being asked to evaluate the rhythm.  She has been admitted with CHF/fluid OL.  LABS:  K+ 3.0 (replacement has been ordered already) BUN/Creat 16/1.22 BNP 1278 poc Trop 0.05 Trop I: 0.11 H/H 11/35 WBC 7.0 plts 271 Last TSH was 06/30/16 3.730  CXR with mild congestion/edema   Past Medical History:  Diagnosis Date  . Breast cancer ([place])   . Cataracts, bilateral   . Glaucoma   . Hyperlipidemia   . Hypertension   . Macular degeneration   . Nodule of right lung   . Obesity   . Sleep apnea      Surgical History:  Past Surgical History:  Procedure Laterality Date  . CATARACT EXTRACTION, BILATERAL    . CESAREAN SECTION    . TOTAL KNEE ARTHROPLASTY     x2      (Not in a hospital admission)  Inpatient Medications:  . diltiazem  15 mg Intravenous Once  . dorzolamide-timolol  1 drop Both Eyes BID  . furosemide  40 mg Intravenous Q12H  .  latanoprost  1 drop Both Eyes QHS  . [START ON 08/12/2016] levothyroxine  88 mcg Oral QAC breakfast  . loratadine  10 mg Oral Daily  . potassium chloride  40 mEq Oral BID  . rosuvastatin  20 mg Oral QPM    Allergies:  Allergies  Allergen Reactions  . Amitriptyline Other (See Comments)    Weakness   . Edarbi [Azilsartan] Other (See Comments)    Lupus  . Morphine And Related Other (See Comments)    "MAKES ME CRAZY"  . Amlodipine Other (See Comments)    Headache   . Atenolol Other (See Comments)    "decreases blood pressure"  . Codeine Nausea Only  . Doxazosin Other (See Comments)    Leg pain  . Dyazide [Hydrochlorothiazide W-Triamterene] Itching  . Erythromycin Diarrhea and Other (See Comments)    Also cramping  . Labetalol Other (See Comments)    Bradycardia   . Levatol [Penbutolol Sulfate] Other (See Comments)    Eye swelling  . Losartan Other (See Comments)    headache  . Monopril [Fosinopril] Hives  . Ace Inhibitors Cough  . Ampicillin Rash  . Belviq [Lorcaserin Hcl] Itching  . Butrans [Buprenorphine] Rash  . Clotrimazole-Betamethasone Rash  . Elocon [Mometasone Furoate] Rash  . Latex Rash    " IF ON ME FOR OVER 24 HOURS"  . Penicillins Other (See Comments)    "  in large doses"  . Propranolol Itching    Social History   Social History  . Marital status: Married    Spouse name: N/A  . Number of children: N/A  . Years of education: N/A   Occupational History  . Not on file.   Social History Main Topics  . Smoking status: Former Smoker    Packs/day: 1.00    Types: Cigarettes    Start date: 08/09/1960  . Smokeless tobacco: Never Used  . Alcohol use No  . Drug use: No  . Sexual activity: Not on file   Other Topics Concern  . Not on file   Social History Narrative  . No narrative on file     Family History  Problem Relation Age of Onset  . Diabetes Mother   . Macular degeneration Mother   . Heart attack Father   . Cancer Father     Colon and  Prostate Cancer  . Diabetes Sister   . Diabetes Brother   . Heart attack Brother   . Diabetes Brother      Review of Systems: All other systems reviewed and are otherwise negative except as noted above.  Physical Exam: Vitals:   08/11/16 0900 08/11/16 0915 08/11/16 0936 08/11/16 1015  BP: 142/99 182/90 144/97 141/70  Pulse: 79 79 76 74  Resp: 23 25 16 18   Temp:   97.8 F (36.6 C)   TempSrc:   Oral   SpO2: 94% 97% 97% 98%  Weight:      Height:        GEN- The patient is well appearing, alert and oriented x 3 today.   HEENT: normocephalic, atraumatic; sclera clear, conjunctiva pink; hearing intact; oropharynx clear; neck supple, no JVP Lymph- no cervical lymphadenopathy Lungs- soft rales b/l, normal work of breathing.  No wheezes or rhonchi Heart- Regular rate and rhythm, no significant murmurs, rubs or gallops, PMI not laterally displaced GI- soft, non-tender, non-distended Extremities- no clubbing, cyanosis,  Trace if any edema MS- no significant deformity or atrophy Skin- warm and dry, no rash or lesion Psych- euthymic mood, full affect Neuro- no gross deficits observed  Labs:   Lab Results  Component Value Date   WBC 7.0 08/11/2016   HGB 11.3 (L) 08/11/2016   HCT 35.8 (L) 08/11/2016   MCV 100.8 (H) 08/11/2016   PLT 271 08/11/2016    Recent Labs Lab 08/11/16 0438  NA 138  K 3.0*  CL 106  CO2 24  BUN 16  CREATININE 1.22*  CALCIUM 9.8  GLUCOSE 155*      Radiology/Studies:  Dg Chest Port 1 View Result Date: 08/11/2016 CLINICAL DATA:  Atrial fibrillation tonight. Chest pressure and shortness of breath. EXAM: PORTABLE CHEST 1 VIEW COMPARISON:  07/11/2012 FINDINGS: Cardiac enlargement with mild pulmonary vascular congestion. Peripheral interstitial changes suggesting interstitial edema. No blunting of costophrenic angles. No pneumothorax. No focal consolidation. Postoperative right mastectomy with surgical clips in the right axilla. Calcified and tortuous  aorta. Degenerative changes in the thoracic spine with thoracic scoliosis convex towards the right. IMPRESSION: Cardiac enlargement with mild pulmonary vascular congestion and interstitial edema. Electronically Signed   By: Lucienne Capers M.D.   On: 08/11/2016 04:17    EKG: (reviewed with Dr. Curt Bears), ST/sinus arrhythmia, APC's, not felt to be AF  TELEMETRY: SR, APC's (presented ST 100's APC's) CARDIAC STUDIES: Echo 12-02-2015: Left ventricle: The cavity size was normal. Wall thickness was increased in a pattern of moderate LVH. Systolic function was mildly reduced.  The estimated ejection fraction was in the range of 45% to 50%. Doppler parameters are consistent with abnormal left ventricular relaxation (grade 1 diastolic dysfunction). ------------------------------------------------------------------- Aortic valve: Mildly thickened, mildly calcified leaflets. Doppler: There was mild regurgitation. ------------------------------------------------------------------- Mitral valve: Calcified annulus. Mildly thickened leaflets . Doppler: There was mild regurgitation. ------------------------------------------------------------------- Left atrium: The atrium was moderately dilated. ------------------------------------------------------------------- Right ventricle: The cavity size was normal. Wall thickness was normal. Systolic function was normal. ------------------------------------------------------------------ Pulmonic valve: Structurally normal valve. Cusp separation was normal. Doppler: Transvalvular velocity was within the normal range. There was mild to moderate regurgitation. ------------------------------------------------------------------- Tricuspid valve: Structurally normal valve. Leaflet separation was normal. Doppler: Transvalvular velocity was within the normal range. There was trivial  regurgitation. ------------------------------------------------------------------- Right atrium: The atrium was normal in size. ------------------------------------------------------------------- Pericardium: A trivial pericardial effusion was identified.  Myoview 10-01-2015: Study Highlights    The left ventricular ejection fraction is mildly decreased (45-54%).  Nuclear stress EF: 48%.  There was no ST segment deviation noted during stress.  This is a low risk study.  Low risk stress nuclear study with a small, moderate intensity, fixed distal anterior and apical defect consistent with breast attenuation; no ischemia; EF 48 but visually appears better; suggest echocardiogram to better assess; mild LVE.     Assessment and Plan:   1. CP/SOB      C/w primary cardiology team      Negative stress test for ischemi Dec 2016  2. Palpitations, tachycardia     EKG, telemetry reviewed by Dr. Curt Bears, ST/APCs, not clear evidence of any AFib  3. Fluid OL, CHF (diastolic/systolic)     Hypokalemia     Being addressed by primary team  4. HTN      Parlee Amescua discuss with cardiology, would recommend BB given her CM and ST, though she has a very lengthy list of medication intolerances and Dorcus Riga discuss with cardiology team.    Signed, Tommye Standard, PA-C 08/11/2016 10:57 AM  I have seen and examined this patient with Tommye Standard.  Agree with above, note added to reflect my findings.  On exam, regular rhythm, no murmurs, lungs clear. Presented to the hospital in heart failure.  ECG at the time was tachycardic.  On further review of the ECG, P waves are evidence as well as on telemetry.  It is likely that this is sinus rhythm with APCs appearing to be AF. Graphic trend also showed a gradual decline in the rate after diltiazem was started which supports sinus rhythm with APCs. Would plan for rate control with beta blockers should the patient tolerate as she has a decreased EF of 45-50%, though  she has not tolerated them in the past and may need to use diltiazem..    Khadejah Son M. Lukisha Procida MD 08/11/2016 12:44 PM

## 2016-08-11 NOTE — Progress Notes (Signed)
ANTICOAGULATION CONSULT NOTE - Initial Consult  Pharmacy Consult for heparin Indication: atrial dysrhythmia  Allergies  Allergen Reactions  . Amitriptyline Other (See Comments)    Weakness   . Edarbi [Azilsartan] Other (See Comments)    Lupus  . Morphine And Related Other (See Comments)    "MAKES ME CRAZY"  . Amlodipine Other (See Comments)    Headache   . Atenolol Other (See Comments)    "decreases blood pressure"  . Codeine Nausea Only  . Doxazosin Other (See Comments)    Leg pain  . Dyazide [Hydrochlorothiazide W-Triamterene] Itching  . Erythromycin Diarrhea and Other (See Comments)    Also cramping  . Labetalol Other (See Comments)    Bradycardia   . Levatol [Penbutolol Sulfate] Other (See Comments)    Eye swelling  . Losartan Other (See Comments)    headache  . Monopril [Fosinopril] Hives  . Ace Inhibitors Cough  . Ampicillin Rash  . Belviq [Lorcaserin Hcl] Itching  . Butrans [Buprenorphine] Rash  . Clotrimazole-Betamethasone Rash  . Elocon [Mometasone Furoate] Rash  . Latex Rash    " IF ON ME FOR OVER 24 HOURS"  . Penicillins Other (See Comments)    "in large doses"  . Propranolol Itching    Patient Measurements: Height: 5\' 5"  (165.1 cm) Weight: 220 lb (99.8 kg) IBW/kg (Calculated) : 57 Heparin Dosing Weight: 79.8  Vital Signs: Temp: 97.8 F (36.6 C) (11/07 0936) Temp Source: Oral (11/07 0936) BP: 144/97 (11/07 0936) Pulse Rate: 76 (11/07 0936)  Labs:  Recent Labs  08/11/16 0438  HGB 11.3*  HCT 35.8*  PLT 271  CREATININE 1.22*    Estimated Creatinine Clearance: 44.5 mL/min (by C-G formula based on SCr of 1.22 mg/dL (H)).   Medical History: Past Medical History:  Diagnosis Date  . Breast cancer ([place])   . Cataracts, bilateral   . Glaucoma   . Hyperlipidemia   . Hypertension   . Macular degeneration   . Nodule of right lung   . Obesity   . Sleep apnea     Assessment: 80 yo F with acute combined systolic and diastolic heart  failure. Pharmacy consulted to dose heparin for atrial dysrhythmia, atypical aflutter vs atrial tachycardia.  To get formal EP eval.  Not on any anticoagulants PTA.  TBW 99.8 kg, HDW 79.8 kg.  Creat 1.22, Hg 11.3, pltc WNL.    Goal of Therapy:  Heparin level 0.3-0.7 units/ml Monitor platelets by anticoagulation protocol: Yes   Plan:  Give 4000 units bolus x 1 Start heparin infusion at 1100 units/hr Check anti-Xa level in 8 hours and daily while on heparin Continue to monitor H&H and platelets  Eudelia Bunch, Pharm.D. [id number] 08/11/2016 9:53 AM

## 2016-08-11 NOTE — H&P (Signed)
History and Physical  Patient ID: KYNSLI UNDERDOWN MRN: [id number], SOB: 02/15/1936 80 y.o. Date of Encounter: 08/11/2016, 7:31 AM  Primary Physician: [name], MD Primary Cardiologist: Dr Gwenlyn Found  Chief Complaint: Palpitations and chest pain  HPI: 80 y.o. female w/ PMHx significant for HTN and hyperlipidemia who presented to [facility] on 08/11/2016 with complaints of Palpitations, chest discomfort, and shortness of breath.  The patient is interviewed at the bedside in the emergency room. Her husband is here with her. She has not felt well since she was hospitalized about 6 weeks ago with hypotension. At that time she was inadvertently taking double the dose of some of her antihypertensive medications. Medicines were adjusted and the patient was discharged home after an overnight observation stay. She was treated with IV fluids and some of her antihypertensives were held.  Since that time, the patient has had shortness of breath with low-level activity. She's had intermittent tachycardia palpitations. She also describes a pressure-like sensation under her left breast. Symptoms have been more consistent over the last 2-3 days. She presented to the emergency department overnight with progressive shortness of breath and chest discomfort. On arrival, she was noted to be in atrial fibrillation and was started on IV Cardizem. At the time of my evaluation, she is back in sinus rhythm.  Other complaints include orthopnea, fatigue, and PND. She denies fevers, chills, or cough. She denies leg edema. She denies exertional chest discomfort. The patient has become very sedentary.   Past Medical History:  Diagnosis Date  . Breast cancer ([place])   . Cataracts, bilateral   . Glaucoma   . Hyperlipidemia   . Hypertension   . Macular degeneration   . Nodule of right lung   . Obesity   . Sleep apnea      Surgical History:  Past Surgical History:  Procedure Laterality Date  . CATARACT  EXTRACTION, BILATERAL    . CESAREAN SECTION    . TOTAL KNEE ARTHROPLASTY     x2     Home Meds: Prior to Admission medications   Medication Sig Start Date End Date Taking? Authorizing Provider  acetaminophen (TYLENOL) 500 MG tablet Take 500 mg by mouth every 6 (six) hours as needed for moderate pain.    Yes Historical Provider, MD  aspirin 81 MG tablet Take 81 mg by mouth daily.   Yes Historical Provider, MD  brimonidine (ALPHAGAN) 0.2 % ophthalmic solution Place 2 drops into both eyes daily.  03/15/15  Yes Historical Provider, MD  cetirizine (ZYRTEC) 10 MG tablet Take 10 mg by mouth daily.   Yes Historical Provider, MD  Cholecalciferol 5000 units TABS Take 5,000 Units by mouth every evening.   Yes Historical Provider, MD  diazepam (VALIUM) 5 MG tablet Take 2.5 mg by mouth every 6 (six) hours as needed for anxiety or sedation.  03/24/12  Yes Historical Provider, MD  dorzolamide-timolol (COSOPT) 22.3-6.8 MG/ML ophthalmic solution Place 1 drop into both eyes 2 (two) times daily.  04/12/12  Yes Historical Provider, MD  hydrALAZINE (APRESOLINE) 10 MG tablet Take 1 tablet (10 mg total) by mouth 3 (three) times daily. 07/01/16  Yes Ripudeep K Rai, MD  latanoprost (XALATAN) 0.005 % ophthalmic solution Place 1 drop into both eyes at bedtime.   Yes Historical Provider, MD  levothyroxine (SYNTHROID, LEVOTHROID) 88 MCG tablet Take 88 mcg by mouth daily before breakfast.   Yes Historical Provider, MD  Multiple Vitamin (MULTIVITAMIN WITH MINERALS) TABS tablet Take 1 tablet  by mouth daily.   Yes Historical Provider, MD  Multiple Vitamins-Minerals (PRESERVISION AREDS 2) CAPS Take 1 capsule by mouth 2 (two) times daily.   Yes Historical Provider, MD  NITROSTAT 0.4 MG SL tablet Place 0.4 mg under the tongue every 5 (five) minutes as needed for chest pain.  06/29/12  Yes Historical Provider, MD  omeprazole (PRILOSEC) 20 MG capsule Take 20 mg by mouth daily as needed (heartburn).  03/24/12  Yes Historical Provider, MD    prednisoLONE acetate (PRED FORTE) 1 % ophthalmic suspension Place 1 drop into the left eye 2 (two) times daily. 10/10/15  Yes Historical Provider, MD  rosuvastatin (CRESTOR) 20 MG tablet Take 20 mg by mouth every evening.   Yes Historical Provider, MD  traZODone (DESYREL) 50 MG tablet Take 25-50 mg by mouth at bedtime as needed for sleep.   Yes Historical Provider, MD  NON FORMULARY Inhale 1 application into the lungs at bedtime. CPAP machine     Historical Provider, MD    Allergies:  Allergies  Allergen Reactions  . Amitriptyline Other (See Comments)    Weakness   . Edarbi [Azilsartan] Other (See Comments)    Lupus  . Morphine And Related Other (See Comments)    "MAKES ME CRAZY"  . Amlodipine Other (See Comments)    Headache   . Atenolol Other (See Comments)    "decreases blood pressure"  . Codeine Nausea Only  . Doxazosin Other (See Comments)    Leg pain  . Dyazide [Hydrochlorothiazide W-Triamterene] Itching  . Erythromycin Diarrhea and Other (See Comments)    Also cramping  . Labetalol Other (See Comments)    Bradycardia   . Levatol [Penbutolol Sulfate] Other (See Comments)    Eye swelling  . Losartan Other (See Comments)    headache  . Monopril [Fosinopril] Hives  . Ace Inhibitors Cough  . Ampicillin Rash  . Belviq [Lorcaserin Hcl] Itching  . Butrans [Buprenorphine] Rash  . Clotrimazole-Betamethasone Rash  . Elocon [Mometasone Furoate] Rash  . Latex Rash    " IF ON ME FOR OVER 24 HOURS"  . Penicillins Other (See Comments)    "in large doses"  . Propranolol Itching    Social History   Social History  . Marital status: Married    Spouse name: N/A  . Number of children: N/A  . Years of education: N/A   Occupational History  . Not on file.   Social History Main Topics  . Smoking status: Former Smoker    Packs/day: 1.00    Types: Cigarettes    Start date: 08/09/1960  . Smokeless tobacco: Never Used  . Alcohol use No  . Drug use: No  . Sexual activity:  Not on file   Other Topics Concern  . Not on file   Social History Narrative  . No narrative on file     Family History  Problem Relation Age of Onset  . Diabetes Mother   . Macular degeneration Mother   . Heart attack Father   . Cancer Father     Colon and Prostate Cancer  . Diabetes Sister   . Diabetes Brother   . Heart attack Brother   . Diabetes Brother     Review of Systems: General: negative for chills, fever, night sweats or weight changes.  ENT: negative for rhinorrhea or epistaxis Cardiovascular: see HPI Dermatological: negative for rash Respiratory: negative for cough or wheezing GI: negative for nausea, vomiting, diarrhea, bright red blood per rectum, melena, or hematemesis  GU: no hematuria, urgency, or frequency Neurologic: negative for visual changes, syncope, headache, or dizziness Heme: no easy bruising or bleeding Endo: negative for excessive thirst, thyroid disorder, or flushing Musculoskeletal: negative for joint pain or swelling, negative for myalgias All other systems reviewed and are otherwise negative except as noted above.  Physical Exam: Blood pressure 168/99, pulse 78, temperature 98.3 F (36.8 C), temperature source Oral, resp. rate 20, height 5\' 5"  (1.651 m), weight 99.8 kg (220 lb), SpO2 98 %. General: Well developed, well nourished, alert and oriented, pleasant overweight woman in no acute distress. HEENT: Normocephalic, atraumatic, sclera anicteric Neck: Supple. Carotids 2+ without bruits. JVP elevated Lungs: Inspiratory rales bilaterally Heart: RRR with normal S1 and S2. 2/6 Systolic murmur a the LLSB Abdomen: Soft, non-tender, non-distended with normoactive bowel sounds. No hepatomegaly. No rebound/guarding. No obvious abdominal masses. Back: No CVA tenderness Msk:  Strength and tone appear normal for age. Extremities: No clubbing, cyanosis, or edema.  Distal pedal pulses are 2+ and equal bilaterally. Neuro: CNII-XII intact, moves all  extremities spontaneously. Psych:  Responds to questions appropriately with a normal affect. Skin: warm and dry without rash   Labs:   Lab Results  Component Value Date   WBC 7.0 08/11/2016   HGB 11.3 (L) 08/11/2016   HCT 35.8 (L) 08/11/2016   MCV 100.8 (H) 08/11/2016   PLT 271 08/11/2016    Recent Labs Lab 08/11/16 0438  NA 138  K 3.0*  CL 106  CO2 24  BUN 16  CREATININE 1.22*  CALCIUM 9.8  GLUCOSE 155*   No results for input(s): CKTOTAL, CKMB, TROPONINI in the last 72 hours. No results found for: CHOL, HDL, LDLCALC, TRIG No results found for: DDIMER  Radiology/Studies:  Dg Chest Port 1 View  Result Date: 08/11/2016 CLINICAL DATA:  Atrial fibrillation tonight. Chest pressure and shortness of breath. EXAM: PORTABLE CHEST 1 VIEW COMPARISON:  07/11/2012 FINDINGS: Cardiac enlargement with mild pulmonary vascular congestion. Peripheral interstitial changes suggesting interstitial edema. No blunting of costophrenic angles. No pneumothorax. No focal consolidation. Postoperative right mastectomy with surgical clips in the right axilla. Calcified and tortuous aorta. Degenerative changes in the thoracic spine with thoracic scoliosis convex towards the right. IMPRESSION: Cardiac enlargement with mild pulmonary vascular congestion and interstitial edema. Electronically Signed   By: Lucienne Capers M.D.   On: 08/11/2016 04:17     EKG: Narrow complex tachycardia, heart rate 120 bpm, atypical atrial flutter versus atrial tachycardia, occasional PVC  CARDIAC STUDIES: Echo 12-02-2015: Left ventricle:  The cavity size was normal. Wall thickness was increased in a pattern of moderate LVH. Systolic function was mildly reduced. The estimated ejection fraction was in the range of 45% to 50%. Doppler parameters are consistent with abnormal left ventricular relaxation (grade 1 diastolic dysfunction).  ------------------------------------------------------------------- Aortic valve:   Mildly  thickened, mildly calcified leaflets. Doppler:  There was mild regurgitation.  ------------------------------------------------------------------- Mitral valve:   Calcified annulus. Mildly thickened leaflets . Doppler:  There was mild regurgitation.  ------------------------------------------------------------------- Left atrium:  The atrium was moderately dilated.  ------------------------------------------------------------------- Right ventricle:  The cavity size was normal. Wall thickness was normal. Systolic function was normal.  ------------------------------------------------------------------- Pulmonic valve:    Structurally normal valve.   Cusp separation was normal.  Doppler:  Transvalvular velocity was within the normal range. There was mild to moderate regurgitation.  ------------------------------------------------------------------- Tricuspid valve:   Structurally normal valve.   Leaflet separation was normal.  Doppler:  Transvalvular velocity was within the normal range. There was trivial  regurgitation.  ------------------------------------------------------------------- Right atrium:  The atrium was normal in size.  ------------------------------------------------------------------- Pericardium:  A trivial pericardial effusion was identified.  Myoview 10-01-2015: Study Highlights    The left ventricular ejection fraction is mildly decreased (45-54%).  Nuclear stress EF: 48%.  There was no ST segment deviation noted during stress.  This is a low risk study.   Low risk stress nuclear study with a small, moderate intensity, fixed distal anterior and apical defect consistent with breast attenuation; no ischemia; EF 48 but visually appears better; suggest echocardiogram to better assess; mild LVE.    ASSESSMENT AND PLAN:  1. Acute combined systolic and diastolic heart failure: The patient has mild LV dysfunction at baseline. Her clinical presentation  and physical exam are consistent with congestive heart failure and volume overload. Will start IV diuresis and follow her renal function closely. Will update her echocardiogram which was last done in February. It's possible that her atrial dysrhythmia is contributing to CHF. Medical therapy will be adjusted appropriately. Unfortunately the patient has a multitude of drug intolerances which include various beta blockers, ace inhibitors, and angiotensin receptor blockers.  2. Atrial dysrhythmia, atypical flutter versus atrial tachycardia: Initially the patient was felt to have atrial fibrillation with RVR, but there appear to be P waves raising the possibility of other supraventricular rhythm abnormalities. Will ask for a formal EP evaluation to help direct her treatment.  3. HTN: multiple intolerances make treatment difficult. Will hold antihypertensives while she is on diltiazem gtt and undergoing IV diuresis.  Deatra James MD 08/11/2016, 7:31 AM

## 2016-08-12 ENCOUNTER — Encounter (HOSPITAL_COMMUNITY): Payer: Self-pay | Admitting: General Practice

## 2016-08-12 DIAGNOSIS — I4891 Unspecified atrial fibrillation: Secondary | ICD-10-CM

## 2016-08-12 LAB — BASIC METABOLIC PANEL
Anion gap: 10 (ref 5–15)
BUN: 19 mg/dL (ref 6–20)
CHLORIDE: 100 mmol/L — AB (ref 101–111)
CO2: 29 mmol/L (ref 22–32)
CREATININE: 1.31 mg/dL — AB (ref 0.44–1.00)
Calcium: 9.2 mg/dL (ref 8.9–10.3)
GFR calc Af Amer: 44 mL/min — ABNORMAL LOW (ref >60)
GFR calc non Af Amer: 38 mL/min — ABNORMAL LOW (ref >60)
GLUCOSE: 111 mg/dL — AB (ref 65–99)
Potassium: 3.1 mmol/L — ABNORMAL LOW (ref 3.5–5.1)
SODIUM: 139 mmol/L (ref 135–145)

## 2016-08-12 LAB — CBC
HCT: 33.6 % — ABNORMAL LOW (ref 36.0–46.0)
Hemoglobin: 10.6 g/dL — ABNORMAL LOW (ref 12.0–15.0)
MCH: 31.7 pg (ref 26.0–34.0)
MCHC: 31.5 g/dL (ref 30.0–36.0)
MCV: 100.6 fL — AB (ref 78.0–100.0)
PLATELETS: 242 10*3/uL (ref 150–400)
RBC: 3.34 MIL/uL — ABNORMAL LOW (ref 3.87–5.11)
RDW: 14.9 % (ref 11.5–15.5)
WBC: 5.5 10*3/uL (ref 4.0–10.5)

## 2016-08-12 LAB — HEPARIN LEVEL (UNFRACTIONATED): Heparin Unfractionated: 0.59 IU/mL (ref 0.30–0.70)

## 2016-08-12 MED ORDER — APIXABAN 5 MG PO TABS
5.0000 mg | ORAL_TABLET | Freq: Two times a day (BID) | ORAL | Status: DC
  Administered 2016-08-12 – 2016-08-13 (×3): 5 mg via ORAL
  Filled 2016-08-12 (×3): qty 1

## 2016-08-12 MED ORDER — DILTIAZEM HCL 30 MG PO TABS
30.0000 mg | ORAL_TABLET | Freq: Four times a day (QID) | ORAL | Status: DC
  Administered 2016-08-12 – 2016-08-13 (×5): 30 mg via ORAL
  Filled 2016-08-12 (×5): qty 1

## 2016-08-12 NOTE — Progress Notes (Signed)
ANTICOAGULATION CONSULT NOTE - Initial Consult  Pharmacy Consult for Apixaban Indication: atrial fibrillation  Allergies  Allergen Reactions  . Amitriptyline Other (See Comments)    Weakness   . Edarbi [Azilsartan] Other (See Comments)    Lupus  . Morphine And Related Other (See Comments)    "MAKES ME CRAZY"  . Amlodipine Other (See Comments)    Headache   . Atenolol Other (See Comments)    "decreases blood pressure"  . Codeine Nausea Only  . Doxazosin Other (See Comments)    Leg pain  . Dyazide [Hydrochlorothiazide W-Triamterene] Itching  . Erythromycin Diarrhea and Other (See Comments)    Also cramping  . Labetalol Other (See Comments)    Bradycardia   . Levatol [Penbutolol Sulfate] Other (See Comments)    Eye swelling  . Losartan Other (See Comments)    headache  . Monopril [Fosinopril] Hives  . Ace Inhibitors Cough  . Ampicillin Rash  . Belviq [Lorcaserin Hcl] Itching  . Butrans [Buprenorphine] Rash  . Clotrimazole-Betamethasone Rash  . Elocon [Mometasone Furoate] Rash  . Latex Rash    " IF ON ME FOR OVER 24 HOURS"  . Penicillins Other (See Comments)    "in large doses"  . Propranolol Itching    Patient Measurements: Height: 5' 2.5" (158.8 cm) Weight: 211 lb 12.8 oz (96.1 kg) IBW/kg (Calculated) : 51.25   Vital Signs: Temp: 98.2 F (36.8 C) (11/08 0800) Temp Source: Oral (11/08 0800) BP: 136/88 (11/08 0800) Pulse Rate: 68 (11/08 0800)  Labs:  Recent Labs  08/11/16 0438 08/11/16 0954 08/11/16 1533 08/11/16 2052 08/12/16 0431  HGB 11.3*  --   --   --  10.6*  HCT 35.8*  --   --   --  33.6*  PLT 271  --   --   --  242  HEPARINUNFRC  --   --   --  0.36 0.59  CREATININE 1.22*  --   --   --  1.31*  TROPONINI  --  0.11* 0.16* 0.10*  --     Estimated Creatinine Clearance: 38.7 mL/min (by C-G formula based on SCr of 1.31 mg/dL (H)).   Medical History: Past Medical History:  Diagnosis Date  . Breast cancer ([place])   . Cataracts, bilateral   .  Glaucoma   . Hyperlipidemia   . Hypertension   . Macular degeneration   . Nodule of right lung   . Obesity   . Sleep apnea     Assessment: 80yo female to convert from Heparin to Apixaban for AFib.  No dosage adjustment necessary.  Goal of Therapy:  Prevention of stroke  Plan:  Apixaban 5mg  po bid Stop heparin with first dose of Apixaban D/C all heparin labs Watch for s/s of bleeding   Gracy Bruins, PharmD Clinical Pharmacist [facility]

## 2016-08-12 NOTE — Progress Notes (Signed)
ANTICOAGULATION CONSULT NOTE - Follow Up Consult  Pharmacy Consult for Heparin Indication: Atrial dysrhythmia  Allergies  Allergen Reactions  . Amitriptyline Other (See Comments)    Weakness   . Edarbi [Azilsartan] Other (See Comments)    Lupus  . Morphine And Related Other (See Comments)    "MAKES ME CRAZY"  . Amlodipine Other (See Comments)    Headache   . Atenolol Other (See Comments)    "decreases blood pressure"  . Codeine Nausea Only  . Doxazosin Other (See Comments)    Leg pain  . Dyazide [Hydrochlorothiazide W-Triamterene] Itching  . Erythromycin Diarrhea and Other (See Comments)    Also cramping  . Labetalol Other (See Comments)    Bradycardia   . Levatol [Penbutolol Sulfate] Other (See Comments)    Eye swelling  . Losartan Other (See Comments)    headache  . Monopril [Fosinopril] Hives  . Ace Inhibitors Cough  . Ampicillin Rash  . Belviq [Lorcaserin Hcl] Itching  . Butrans [Buprenorphine] Rash  . Clotrimazole-Betamethasone Rash  . Elocon [Mometasone Furoate] Rash  . Latex Rash    " IF ON ME FOR OVER 24 HOURS"  . Penicillins Other (See Comments)    "in large doses"  . Propranolol Itching    Patient Measurements: Height: 5' 2.5" (158.8 cm) Weight: 211 lb 12.8 oz (96.1 kg) IBW/kg (Calculated) : 51.25 Heparin Dosing Weight:   Vital Signs: Temp: 98.2 F (36.8 C) (11/08 0800) Temp Source: Oral (11/08 0800) BP: 136/88 (11/08 0800) Pulse Rate: 68 (11/08 0800)  Labs:  Recent Labs  08/11/16 0438 08/11/16 0954 08/11/16 1533 08/11/16 2052 08/12/16 0431  HGB 11.3*  --   --   --  10.6*  HCT 35.8*  --   --   --  33.6*  PLT 271  --   --   --  242  HEPARINUNFRC  --   --   --  0.36 0.59  CREATININE 1.22*  --   --   --  1.31*  TROPONINI  --  0.11* 0.16* 0.10*  --     Estimated Creatinine Clearance: 38.7 mL/min (by C-G formula based on SCr of 1.31 mg/dL (H)).  Assessment: 80yo female with new AFib/RVR vs atrial dysrhtmia, EP consulted.  Heparin  level therapeutic this AM but up from initial level last PM.  Hg ~stable and pltc wnl.  No bleeding noted.    Goal of Therapy:  Heparin level 0.3-0.7 units/ml Monitor platelets by anticoagulation protocol: Yes   Plan:  Decrease heparin to 1050 units/hr Watch for s/s of bleeding  Gracy Bruins, [facility]

## 2016-08-12 NOTE — Progress Notes (Signed)
Pt on Cardizem drip for A-fib RVR, HR dropped in the 40-50's for a short time, back to the 60's at this time. Cardiology team paged.  Ferdinand Lango, RN

## 2016-08-12 NOTE — Discharge Instructions (Signed)
Atrial Fibrillation °Atrial fibrillation is a type of irregular or rapid heartbeat (arrhythmia). In atrial fibrillation, the heart quivers continuously in a chaotic pattern. This occurs when parts of the heart receive disorganized signals that make the heart unable to pump blood normally. This can increase the risk for stroke, heart failure, and other heart-related conditions. There are different types of atrial fibrillation, including: °· Paroxysmal atrial fibrillation. This type starts suddenly, and it usually stops on its own shortly after it starts. °· Persistent atrial fibrillation. This type often lasts longer than a week. It may stop on its own or with treatment. °· Long-lasting persistent atrial fibrillation. This type lasts longer than 12 months. °· Permanent atrial fibrillation. This type does not go away. °Talk with your health care provider to learn about the type of atrial fibrillation that you have. °CAUSES °This condition is caused by some heart-related conditions or procedures, including: °· A heart attack. °· Coronary artery disease. °· Heart failure. °· Heart valve conditions. °· High blood pressure. °· Inflammation of the sac that surrounds the heart (pericarditis). °· Heart surgery. °· Certain heart rhythm disorders, such as Wolf-Parkinson-White syndrome. °Other causes include: °· Pneumonia. °· Obstructive sleep apnea. °· Blockage of an artery in the lungs (pulmonary embolism, or PE). °· Lung cancer. °· Chronic lung disease. °· Thyroid problems, especially if the thyroid is overactive (hyperthyroidism). °· Caffeine. °· Excessive alcohol use or illegal drug use. °· Use of some medicines, including certain decongestants and diet pills. °Sometimes, the cause cannot be found. °RISK FACTORS °This condition is more likely to develop in: °· People who are older in age. °· People who smoke. °· People who have diabetes mellitus. °· People who are overweight (obese). °· Athletes who exercise  vigorously. °SYMPTOMS °Symptoms of this condition include: °· A feeling that your heart is beating rapidly or irregularly. °· A feeling of discomfort or pain in your chest. °· Shortness of breath. °· Sudden light-headedness or weakness. °· Getting tired easily during exercise. °In some cases, there are no symptoms. °DIAGNOSIS °Your health care provider may be able to detect atrial fibrillation when taking your pulse. If detected, this condition may be diagnosed with: °· An electrocardiogram (ECG). °· A Holter monitor test that records your heartbeat patterns over a 24-hour period. °· Transthoracic echocardiogram (TTE) to evaluate how blood flows through your heart. °· Transesophageal echocardiogram (TEE) to view more detailed images of your heart. °· A stress test. °· Imaging tests, such as a CT scan or chest X-ray. °· Blood tests. °TREATMENT °The main goals of treatment are to prevent blood clots from forming and to keep your heart beating at a normal rate and rhythm. The type of treatment that you receive depends on many factors, such as your underlying medical conditions and how you feel when you are experiencing atrial fibrillation. °This condition may be treated with: °· Medicine to slow down the heart rate, bring the heart's rhythm back to normal, or prevent clots from forming. °· Electrical cardioversion. This is a procedure that resets your heart's rhythm by delivering a controlled, low-energy shock to the heart through your skin. °· Different types of ablation, such as catheter ablation, catheter ablation with pacemaker, or surgical ablation. These procedures destroy the heart tissues that send abnormal signals. When the pacemaker is used, it is placed under your skin to help your heart beat in a regular rhythm. °HOME CARE INSTRUCTIONS °· Take over-the counter and prescription medicines only as told by your health care provider. °·   If your health care provider prescribed a blood-thinning medicine  (anticoagulant), take it exactly as told. Taking too much blood-thinning medicine can cause bleeding. If you do not take enough blood-thinning medicine, you will not have the protection that you need against stroke and other problems.  Do not use tobacco products, including cigarettes, chewing tobacco, and e-cigarettes. If you need help quitting, ask your health care provider.  If you have obstructive sleep apnea, manage your condition as told by your health care provider.  Do not drink alcohol.  Do not drink beverages that contain caffeine, such as coffee, soda, and tea.  Maintain a healthy weight. Do not use diet pills unless your health care provider approves. Diet pills may make heart problems worse.  Follow diet instructions as told by your health care provider.  Exercise regularly as told by your health care provider.  Keep all follow-up visits as told by your health care provider. This is important. PREVENTION  Avoid drinking beverages that contain caffeine or alcohol.  Avoid certain medicines, especially medicines that are used for breathing problems.  Avoid certain herbs and herbal medicines, such as those that contain ephedra or ginseng.  Do not use illegal drugs, such as cocaine and amphetamines.  Do not smoke.  Manage your high blood pressure. SEEK MEDICAL CARE IF:  You notice a change in the rate, rhythm, or strength of your heartbeat.  You are taking an anticoagulant and you notice increased bruising.  You tire more easily when you exercise or exert yourself. SEEK IMMEDIATE MEDICAL CARE IF:  You have chest pain, abdominal pain, sweating, or weakness.  You feel nauseous.  You notice blood in your vomit, bowel movement, or urine.  You have shortness of breath.  You suddenly have swollen feet and ankles.  You feel dizzy.  You have sudden weakness or numbness of the face, arm, or leg, especially on one side of the body.  You have trouble speaking,  trouble understanding, or both (aphasia).  Your face or your eyelid droops on one side. These symptoms may represent a serious problem that is an emergency. Do not wait to see if the symptoms will go away. Get medical help right away. Call your local emergency services (911 in the [place] Do not drive yourself to the hospital.   This information is not intended to replace advice given to you by your health care provider. Make sure you discuss any questions you have with your health care provider.   Document Released: 09/21/2005 Document Revised: 06/12/2015 Document Reviewed: 01/16/2015 Elsevier Interactive Patient Education 2016 [place] on my medicine - ELIQUIS (apixaban)  This medication education was reviewed with me or my healthcare representative as part of my discharge preparation.  The pharmacist that spoke with me during my hospital stay was:  Jaquita Folds, [facility]  Why was Eliquis prescribed for you? Eliquis was prescribed for you to reduce the risk of a blood clot forming that can cause a stroke if you have a medical condition called atrial fibrillation (a type of irregular heartbeat).  What do You need to know about Eliquis ? Take your Eliquis TWICE DAILY - one tablet in the morning and one tablet in the evening with or without food. If you have difficulty swallowing the tablet whole please discuss with your pharmacist how to take the medication safely.  Take Eliquis exactly as prescribed by your doctor and DO NOT stop taking Eliquis without talking to the doctor who prescribed the medication.  Stopping may increase your risk of developing a stroke.  Refill your prescription before you run out.  After discharge, you should have regular check-up appointments with your healthcare provider that is prescribing your Eliquis.  In the future your dose may need to be changed if your kidney function or weight changes by a significant amount or as you get  older.  What do you do if you miss a dose? If you miss a dose, take it as soon as you remember on the same day and resume taking twice daily.  Do not take more than one dose of ELIQUIS at the same time to make up a missed dose.  Important Safety Information A possible side effect of Eliquis is bleeding. You should call your healthcare provider right away if you experience any of the following: ? Bleeding from an injury or your nose that does not stop. ? Unusual colored urine (red or dark brown) or unusual colored stools (red or black). ? Unusual bruising for unknown reasons. ? A serious fall or if you hit your head (even if there is no bleeding).  Some medicines may interact with Eliquis and might increase your risk of bleeding or clotting while on Eliquis. To help avoid this, consult your healthcare provider or pharmacist prior to using any new prescription or non-prescription medications, including herbals, vitamins, non-steroidal anti-inflammatory drugs (NSAIDs) and supplements.  This website has more information on Eliquis (apixaban): http://www.eliquis.com/eliquis/home

## 2016-08-12 NOTE — Progress Notes (Signed)
Patient Name: Amy Hahn Date of Encounter: 08/12/2016  Primary Cardiologist: Dr. Serena Croissant Problem List     Active Problems:   Atrial flutter with rapid ventricular response (HCC)     Subjective   Breathing is much better today, able to sleep last night.   Inpatient Medications    Scheduled Meds: . diltiazem  15 mg Intravenous Once  . dorzolamide-timolol  1 drop Both Eyes BID  . furosemide  40 mg Intravenous Q12H  . latanoprost  1 drop Both Eyes QHS  . levothyroxine  88 mcg Oral QAC breakfast  . loratadine  10 mg Oral Daily  . potassium chloride  40 mEq Oral BID  . rosuvastatin  20 mg Oral QPM   Continuous Infusions: . diltiazem (CARDIZEM) infusion 5 mg/hr (08/12/16 [id number])  . heparin 1,100 Units/hr (08/11/16 1029)   PRN Meds: acetaminophen, diazepam, ondansetron (ZOFRAN) IV, traZODone   Vital Signs    Vitals:   08/11/16 2328 08/12/16 0359 08/12/16 0600 08/12/16 0800  BP: 121/80 128/89 113/72 136/88  Pulse: 70 77 (!) 47 68  Resp: (!) 26 (!) 24 (!) 21 (!) 22  Temp: 97.6 F (36.4 C) 97.6 F (36.4 C)  98.2 F (36.8 C)  TempSrc: Oral Oral  Oral  SpO2: 98% 95% 99% 98%  Weight:  211 lb 12.8 oz (96.1 kg)    Height:        Intake/Output Summary (Last 24 hours) at 08/12/16 0919 Last data filed at 08/12/16 0842  Gross per 24 hour  Intake            396.6 ml  Output             2575 ml  Net          -2178.4 ml   Filed Weights   08/11/16 0400 08/11/16 1529 08/12/16 0359  Weight: 220 lb (99.8 kg) 208 lb 9.6 oz (94.6 kg) 211 lb 12.8 oz (96.1 kg)    Physical Exam    GEN: Well nourished, well developed, obese WF in no acute distress.  HEENT: Grossly normal.  Neck: Supple, + JVD, carotid bruits, or masses. Cardiac: RRR, 2/6 systolic murmurs, rubs, or gallops. No clubbing, cyanosis, edema.  Radials/DP/PT 2+ and equal bilaterally.  Respiratory:  Respirations regular and unlabored, mild crackles bilaterally GI: Soft, obese nontender, nondistended, BS + x  4. MS: no deformity or atrophy. Skin: warm and dry, no rash. Neuro:  Strength and sensation are intact. Psych: AAOx3.  Normal affect.  Labs    CBC  Recent Labs  08/11/16 0438 08/12/16 0431  WBC 7.0 5.5  HGB 11.3* 10.6*  HCT 35.8* 33.6*  MCV 100.8* 100.6*  PLT 271 [id number]   Basic Metabolic Panel  Recent Labs  08/11/16 0438 08/12/16 0431  NA 138 139  K 3.0* 3.1*  CL 106 100*  CO2 24 29  GLUCOSE 155* 111*  BUN 16 19  CREATININE 1.22* 1.31*  CALCIUM 9.8 9.2   Liver Function Tests No results for input(s): AST, ALT, ALKPHOS, BILITOT, PROT, ALBUMIN in the last 72 hours. No results for input(s): LIPASE, AMYLASE in the last 72 hours. Cardiac Enzymes  Recent Labs  08/11/16 0954 08/11/16 1533 08/11/16 2052  TROPONINI 0.11* 0.16* 0.10*   BNP Invalid input(s): POCBNP D-Dimer No results for input(s): DDIMER in the last 72 hours. Hemoglobin A1C No results for input(s): HGBA1C in the last 72 hours. Fasting Lipid Panel No results for input(s): CHOL, HDL, LDLCALC, TRIG, CHOLHDL, LDLDIRECT in  the last 72 hours. Thyroid Function Tests No results for input(s): TSH, T4TOTAL, T3FREE, THYROIDAB in the last 72 hours.  Invalid input(s): FREET3  Telemetry    Atrial Flutter vs SR with APCs- Personally Reviewed  ECG    ST with atrial flutter vs atrial tachycardia - Personally Reviewed  Radiology    Dg Chest Port 1 View  Result Date: 08/11/2016 CLINICAL DATA:  Atrial fibrillation tonight. Chest pressure and shortness of breath. EXAM: PORTABLE CHEST 1 VIEW COMPARISON:  07/11/2012 FINDINGS: Cardiac enlargement with mild pulmonary vascular congestion. Peripheral interstitial changes suggesting interstitial edema. No blunting of costophrenic angles. No pneumothorax. No focal consolidation. Postoperative right mastectomy with surgical clips in the right axilla. Calcified and tortuous aorta. Degenerative changes in the thoracic spine with thoracic scoliosis convex towards the right.  IMPRESSION: Cardiac enlargement with mild pulmonary vascular congestion and interstitial edema. Electronically Signed   By: Lucienne Capers M.D.   On: 08/11/2016 04:17    Cardiac Studies   Echo: pending.  Patient Profile     80 yo female with PMH of HTN, and HLD who presented to the [facility] ED with c/o palpitations, chest discomfort and dyspnea.   Assessment & Plan    1. Acute on chronic combined HF: Currently on IV lasix with good UOP 2.4L yesterday. BNP 1278 on admission, and CXR with edema. Weight 220lbs>> 211lbs overnight? States her weight at home runs around 225-230lbs so difficult to know what her dry weight is. She reports decreased appetite at home. Breathing has improved this morning.  -- Continue with IV lasix, Cr 1.31 today, up slightly from yesterday. Monitor BMET\  2. Atrial dysrhythmia: She was started on IV diltiazem yesterday with improvement in her rate. See by EP, felt to be ST/APCs and not clear Afib. P waves noted on telemetry. Recommendations to place on BB, if able to tolerate. On telemetry she has had some bradycardia noted into the upper 30s. Will defer decision to MD.  -- remains on IV heparin   3. HTN: Stable, remains off home antihypertensives while on Diltiazem and IV lasix.   4. Hypokalemia: 3.1 today, replaced. Monitor while on IV lasix.   5. Elevated Trop: Mild flat non-diagnostic trend, likely demand ischemia with her tachycardia. Echo pending.   Signed, Reino Bellis, NP  08/12/2016, 9:19 AM   Patient seen, examined. Available data reviewed. Agree with findings, assessment, and plan as outlined by Reino Bellis, NP. All data reviewed. Exam reveals alert, oriented woman in NAD, lungs CTA, heart irregularly irregular with 2/6 systolic murmur at the RUSB, JVP moderately elevated, no peripheral edema. Tele shows atrial fibrillation with controlled ventricular response. Labs reviewed. Pt has responded well to IV lasix. Plan:  12-lead EKG to confirm  atrial fibrillation  apixaban 5 mg BID if AF confirmed  Convert IV to oral diltiazem (pt allergic to beta-blockers - rash)  Await 2 D Echo  Sherren Mocha, M.D. 08/12/2016 11:13 AM

## 2016-08-13 ENCOUNTER — Inpatient Hospital Stay (HOSPITAL_COMMUNITY): Payer: Medicare Other

## 2016-08-13 DIAGNOSIS — I5021 Acute systolic (congestive) heart failure: Secondary | ICD-10-CM

## 2016-08-13 DIAGNOSIS — I4892 Unspecified atrial flutter: Secondary | ICD-10-CM

## 2016-08-13 DIAGNOSIS — I4891 Unspecified atrial fibrillation: Secondary | ICD-10-CM

## 2016-08-13 LAB — CBC
HCT: 36.4 % (ref 36.0–46.0)
Hemoglobin: 11.4 g/dL — ABNORMAL LOW (ref 12.0–15.0)
MCH: 31.9 pg (ref 26.0–34.0)
MCHC: 31.3 g/dL (ref 30.0–36.0)
MCV: 102 fL — ABNORMAL HIGH (ref 78.0–100.0)
PLATELETS: 285 10*3/uL (ref 150–400)
RBC: 3.57 MIL/uL — AB (ref 3.87–5.11)
RDW: 14.9 % (ref 11.5–15.5)
WBC: 6.7 10*3/uL (ref 4.0–10.5)

## 2016-08-13 LAB — ECHOCARDIOGRAM COMPLETE
Height: 62.5 in
WEIGHTICAEL: 3398.4 [oz_av]

## 2016-08-13 LAB — BASIC METABOLIC PANEL
Anion gap: 11 (ref 5–15)
BUN: 29 mg/dL — AB (ref 6–20)
CALCIUM: 9.5 mg/dL (ref 8.9–10.3)
CO2: 28 mmol/L (ref 22–32)
CREATININE: 1.58 mg/dL — AB (ref 0.44–1.00)
Chloride: 98 mmol/L — ABNORMAL LOW (ref 101–111)
GFR calc non Af Amer: 30 mL/min — ABNORMAL LOW (ref >60)
GFR, EST AFRICAN AMERICAN: 35 mL/min — AB (ref >60)
Glucose, Bld: 123 mg/dL — ABNORMAL HIGH (ref 65–99)
Potassium: 3.9 mmol/L (ref 3.5–5.1)
SODIUM: 137 mmol/L (ref 135–145)

## 2016-08-13 MED ORDER — POTASSIUM CHLORIDE CRYS ER 20 MEQ PO TBCR: 40.0000 meq | EXTENDED_RELEASE_TABLET | Freq: Every day | ORAL | 6 refills | Status: DC

## 2016-08-13 MED ORDER — METOPROLOL TARTRATE 25 MG PO TABS
25.0000 mg | ORAL_TABLET | Freq: Two times a day (BID) | ORAL | Status: DC
  Administered 2016-08-13: 25 mg via ORAL
  Filled 2016-08-13: qty 1

## 2016-08-13 MED ORDER — PERFLUTREN LIPID MICROSPHERE
INTRAVENOUS | Status: AC
  Administered 2016-08-13: 2 mL
  Filled 2016-08-13: qty 10

## 2016-08-13 MED ORDER — PERFLUTREN LIPID MICROSPHERE
1.0000 mL | INTRAVENOUS | Status: AC | PRN
  Administered 2016-08-13: 2 mL via INTRAVENOUS
  Filled 2016-08-13: qty 10

## 2016-08-13 MED ORDER — METOPROLOL TARTRATE 25 MG PO TABS: 25.0000 mg | ORAL_TABLET | Freq: Two times a day (BID) | ORAL | 6 refills | Status: DC

## 2016-08-13 MED ORDER — FUROSEMIDE 40 MG PO TABS: 40.0000 mg | ORAL_TABLET | Freq: Every day | ORAL | Status: DC

## 2016-08-13 MED ORDER — APIXABAN 5 MG PO TABS: 5.0000 mg | ORAL_TABLET | Freq: Two times a day (BID) | ORAL | 6 refills | Status: DC

## 2016-08-13 MED ORDER — FUROSEMIDE 40 MG PO TABS
40.0000 mg | ORAL_TABLET | Freq: Every day | ORAL | 6 refills | Status: DC
Start: 2016-08-14 — End: 2017-03-08

## 2016-08-13 NOTE — Care Management (Addendum)
Benefits Check completed for Eliquis 5 MG BID   COVER- YES  CO-PAY- $ 35.00  TIER- 2 DRUG  PRIOR APPROVAL- YES # [phone]  PHARMACY : [facility]- CM will provide pt with 30 day free card. Medication is available @ the pharmacy. No further needs from CM at this time. [place] 08-13-16 Jacqlyn Krauss, RN,BSN [phone]

## 2016-08-13 NOTE — Progress Notes (Signed)
  Echocardiogram 2D Echocardiogram with Definity has been performed.  Tresa Res 08/13/2016, 11:52 AM

## 2016-08-13 NOTE — Progress Notes (Signed)
Patient Name: Amy Hahn Date of Encounter: 08/13/2016  Primary Cardiologist: Dr. Serena Croissant Problem List     Active Problems:   Atrial flutter with rapid ventricular response (HCC)     Subjective   Feels well. No longer on [place]. Wants to go home.   Inpatient Medications    Scheduled Meds: . apixaban  5 mg Oral BID  . diltiazem  30 mg Oral Q6H  . dorzolamide-timolol  1 drop Both Eyes BID  . furosemide  40 mg Intravenous Q12H  . latanoprost  1 drop Both Eyes QHS  . levothyroxine  88 mcg Oral QAC breakfast  . loratadine  10 mg Oral Daily  . potassium chloride  40 mEq Oral BID  . rosuvastatin  20 mg Oral QPM   Continuous Infusions:  PRN Meds: acetaminophen, diazepam, ondansetron (ZOFRAN) IV, traZODone   Vital Signs    Vitals:   08/13/16 0016 08/13/16 0249 08/13/16 0634 08/13/16 0800  BP: 134/86  (!) 137/93 140/66  Pulse: 78  79 72  Resp: (!) 26  20 (!) 25  Temp: 97.3 F (36.3 C)  97.5 F (36.4 C) 98 F (36.7 C)  TempSrc: Oral  Oral Oral  SpO2: 98%  97%   Weight:  212 lb 6.4 oz (96.3 kg)    Height:        Intake/Output Summary (Last 24 hours) at 08/13/16 1018 Last data filed at 08/13/16 0644  Gross per 24 hour  Intake              240 ml  Output             2525 ml  Net            -2285 ml   Filed Weights   08/11/16 1529 08/12/16 0359 08/13/16 0249  Weight: 208 lb 9.6 oz (94.6 kg) 211 lb 12.8 oz (96.1 kg) 212 lb 6.4 oz (96.3 kg)    Physical Exam    GEN: Well nourished, well developed, obese WF in no acute distress.  HEENT: Grossly normal.  Neck: Supple, + JVD, carotid bruits, or masses. Cardiac: Irreg Irreg, 2/6 systolic murmurs, rubs, or gallops. No clubbing, cyanosis, edema.  Radials/DP/PT 2+ and equal bilaterally.  Respiratory:  Respirations regular and unlabored, clear bilaterally.  GI: Soft, obese nontender, nondistended, BS + x 4. MS: no deformity or atrophy. Skin: warm and dry, no rash. Neuro:  Strength and sensation are  intact. Psych: AAOx3.  Normal affect.  Labs    CBC  Recent Labs  08/11/16 0438 08/12/16 0431  WBC 7.0 5.5  HGB 11.3* 10.6*  HCT 35.8* 33.6*  MCV 100.8* 100.6*  PLT 271 [id number]   Basic Metabolic Panel  Recent Labs  08/11/16 0438 08/12/16 0431  NA 138 139  K 3.0* 3.1*  CL 106 100*  CO2 24 29  GLUCOSE 155* 111*  BUN 16 19  CREATININE 1.22* 1.31*  CALCIUM 9.8 9.2   Liver Function Tests No results for input(s): AST, ALT, ALKPHOS, BILITOT, PROT, ALBUMIN in the last 72 hours. No results for input(s): LIPASE, AMYLASE in the last 72 hours. Cardiac Enzymes  Recent Labs  08/11/16 0954 08/11/16 1533 08/11/16 2052  TROPONINI 0.11* 0.16* 0.10*   BNP Invalid input(s): POCBNP D-Dimer No results for input(s): DDIMER in the last 72 hours. Hemoglobin A1C No results for input(s): HGBA1C in the last 72 hours. Fasting Lipid Panel No results for input(s): CHOL, HDL, LDLCALC, TRIG, CHOLHDL, LDLDIRECT in the last 72  hours. Thyroid Function Tests No results for input(s): TSH, T4TOTAL, T3FREE, THYROIDAB in the last 72 hours.  Invalid input(s): FREET3  Telemetry    AF - Personally Reviewed  ECG    AF with T wave abnormality in anterolateral leads - Personally Reviewed  Radiology    No results found.  Cardiac Studies   Echo: pending.  Patient Profile     80 yo female with PMH of HTN, and HLD who presented to the [facility] ED with c/o palpitations, chest discomfort and dyspnea.   Assessment & Plan    1. Acute on chronic combined HF: Currently on IV lasix with good UOP 2.9L yesterday. BNP 1278 on admission, and CXR with edema. Weight 220lbs>> 211lbs overnight? States her weight at home runs around 225-230lbs so difficult to know what her dry weight is. She reports decreased appetite at home. Breathing much improved. -- Plan to transition to PO lasix today. Cr 1.31 yesterday, BMET pending this morning. -- Echo pending.   2. Atrial dysrhythmia/Atrial Fib: She was  started on IV diltiazem  with improvement in her rate. See by EP, felt to be ST/APCs and not clear Afib. Follow up EKG showed AF, she was transitioned to oral diltiazem and Eliquis yesterday. Plan to switch to 120mg  Cardizem today. Rates are controlled.  -- This patients CHA2DS2-VASc Score and unadjusted Ischemic Stroke Rate (% per year) is equal to 4.8 % stroke rate/year from a score of 4 Above score calculated as 1 point each if present [CHF, HTN, DM, Vascular=MI/PAD/Aortic Plaque, Age if 65-74, or Female] Above score calculated as 2 points each if present [Age > 75, or Stroke/TIA/TE]  3. HTN: Stable.  4. Hypokalemia: 3.1 yesterday, replaced. BMET pending. Monitor while on IV lasix.   5. Elevated Trop: Mild flat non-diagnostic trend, likely demand ischemia with her tachycardia. EKG yesterday does confirm AF with new T wave inversions in anterolateral leads. No reports of chest pain.   Signed, Reino Bellis, NP  08/13/2016, 10:18 AM  Patient seen and examined. I agree with the assessment and plan as detailed above. See also my additional thoughts below.   The patient feels much better today. In addition to diuresis, having a good bowel movement has played a role in her feeling better. Lab was being drawn to check her chemistry. Two-dimensional echo has been done. These results will be obtained as the day goes on. If they are stable, she can be discharged home.  Dola Argyle, MD, [facility] 08/13/2016 10:40 AM

## 2016-08-13 NOTE — Discharge Summary (Signed)
Discharge Summary    Patient ID: Amy Hahn,  MRN: [id number], DOB/AGE: 01-21-1936 80 y.o.  Admit date: 08/11/2016 Discharge date: 08/13/2016  Primary Care Provider: [name] Primary Cardiologist: Dr. Gwenlyn Found  Discharge Diagnoses    Active Problems:   Atrial flutter with rapid ventricular response (HCC)   Acute systolic heart failure (HCC)   Allergies Allergies  Allergen Reactions  . Amitriptyline Other (See Comments)    Weakness   . Edarbi [Azilsartan] Other (See Comments)    Lupus  . Morphine And Related Other (See Comments)    "MAKES ME CRAZY"  . Amlodipine Other (See Comments)    Headache   . Atenolol Other (See Comments)    "decreases blood pressure"  . Codeine Nausea Only  . Doxazosin Other (See Comments)    Leg pain  . Dyazide [Hydrochlorothiazide W-Triamterene] Itching  . Erythromycin Diarrhea and Other (See Comments)    Also cramping  . Labetalol Other (See Comments)    Bradycardia   . Levatol [Penbutolol Sulfate] Other (See Comments)    Eye swelling  . Losartan Other (See Comments)    headache  . Monopril [Fosinopril] Hives  . Ace Inhibitors Cough  . Ampicillin Rash  . Belviq [Lorcaserin Hcl] Itching  . Butrans [Buprenorphine] Rash  . Clotrimazole-Betamethasone Rash  . Elocon [Mometasone Furoate] Rash  . Latex Rash    " IF ON ME FOR OVER 24 HOURS"  . Penicillins Other (See Comments)    "in large doses"  . Propranolol Itching    Diagnostic Studies/Procedures    Echo 08/13/16:   Study Conclusions  - Left ventricle: The cavity size was mildly dilated. There was   mild concentric hypertrophy. Systolic function was moderately   reduced. The estimated ejection fraction was in the range of 35%   to 40%. Diffuse hypokinesis. There is akinesis of the mid   inferoseptal and apical septal myocardium. Normal sinus rhythm   was absent. The study is not technically sufficient to allow   evaluation of LV diastolic function. - Aortic valve: There  was mild regurgitation. - Mitral valve: There was mild regurgitation. - Left atrium: The atrium was moderately dilated. _____________   History of Present Illness     80 y.o. female w/ PMHx significant for HTN and hyperlipidemia who presented to [facility] on 08/11/2016 with complaints of Palpitations, chest discomfort, and shortness of breath.  The patient was interviewed at the bedside in the emergency room. Her husband was here with her. She reported not feeling well since she was hospitalized about 6 weeks ago with hypotension. At that time she was inadvertently taking double the dose of some of her antihypertensive medications. Medicines were adjusted and the patient was discharged home after an overnight observation stay. She was treated with IV fluids and some of her antihypertensives were held.  Since that time, the patient has had shortness of breath with low-level activity. She's had intermittent tachycardia palpitations. She also describes a pressure-like sensation under her left breast. Symptoms have been more consistent over the last 2-3 days. She presented to the emergency department overnight with progressive shortness of breath and chest discomfort. On arrival, she was noted to be in atrial fibrillation and was started on IV Cardizem. At the time of evaluation, she is back in sinus rhythm.  Other complaints include orthopnea, fatigue, and PND. She denies fevers, chills, or cough. She denies leg edema. She denies exertional chest discomfort. The patient has become very sedentary.  Hospital Course     Consultants: EP   On admission her labs showed a BNP 1278, with a mild flat trop trend of 0.11>>0.10. She was admitted for IV diuresis and EP was asked to further evaluate her atrial dysrhythmia as it appeared to be atrial flutter vs atrial tachycardia. She was seen by Dr. Baird Kay and Joseph Art who felt that the rhythm appeared to be sinus rhythm with APCs, recommendations were  given to attempt control with BB if able to tolerate.    The following day her telemetry was reviewed and noted AF/Atrial flutter. EKG was done to confirm. She was placed on Eliquis for anticoagulation, and IV diltiazem converted to PO. Her rate improved and noted better control. Follow up 2D echo did show a slight decrease in her EF from 45% to 35-40%. Discussed this with Dr. Burt Knack and decision was made to transition from diltiazem to metoprolol 25mg  BID. Will need to monitor this transition as she has had difficulty with beta blockers in the past. She diuresed a total of 4.9L during this admission with a discharge weight of 212 lbs. She was able to wean from [place], and breathing improved significantly. With IV diuresis her Cr did have a slight increase from 1.2>>1.5. She will be transitioned from 40mg  IV BID to 40mg  daily at home.   She was seen and assessed by Dr. Burt Knack on 11/9 and determined stable for discharge home. I have arranged for follow up in the office within 1 week to determine tolerance to BB and follow up BMET.    _____________  Discharge Vitals Blood pressure (!) 144/71, pulse 78, temperature 98.5 F (36.9 C), temperature source Oral, resp. rate 18, height 5' 2.5" (1.588 m), weight 212 lb 6.4 oz (96.3 kg), SpO2 98 %.  Filed Weights   08/11/16 1529 08/12/16 0359 08/13/16 0249  Weight: 208 lb 9.6 oz (94.6 kg) 211 lb 12.8 oz (96.1 kg) 212 lb 6.4 oz (96.3 kg)    Labs & Radiologic Studies    CBC  Recent Labs  08/12/16 0431 08/13/16 1036  WBC 5.5 6.7  HGB 10.6* 11.4*  HCT 33.6* 36.4  MCV 100.6* 102.0*  PLT 242 [id number]   Basic Metabolic Panel  Recent Labs  08/12/16 0431 08/13/16 1036  NA 139 137  K 3.1* 3.9  CL 100* 98*  CO2 29 28  GLUCOSE 111* 123*  BUN 19 29*  CREATININE 1.31* 1.58*  CALCIUM 9.2 9.5   Liver Function Tests No results for input(s): AST, ALT, ALKPHOS, BILITOT, PROT, ALBUMIN in the last 72 hours. No results for input(s): LIPASE, AMYLASE in the last 72  hours. Cardiac Enzymes  Recent Labs  08/11/16 0954 08/11/16 1533 08/11/16 2052  TROPONINI 0.11* 0.16* 0.10*   BNP Invalid input(s): POCBNP D-Dimer No results for input(s): DDIMER in the last 72 hours. Hemoglobin A1C No results for input(s): HGBA1C in the last 72 hours. Fasting Lipid Panel No results for input(s): CHOL, HDL, LDLCALC, TRIG, CHOLHDL, LDLDIRECT in the last 72 hours. Thyroid Function Tests No results for input(s): TSH, T4TOTAL, T3FREE, THYROIDAB in the last 72 hours.  Invalid input(s): FREET3 _____________  Dg Chest Port 1 View  Result Date: 08/11/2016 CLINICAL DATA:  Atrial fibrillation tonight. Chest pressure and shortness of breath. EXAM: PORTABLE CHEST 1 VIEW COMPARISON:  07/11/2012 FINDINGS: Cardiac enlargement with mild pulmonary vascular congestion. Peripheral interstitial changes suggesting interstitial edema. No blunting of costophrenic angles. No pneumothorax. No focal consolidation. Postoperative right mastectomy with surgical clips in the right axilla.  Calcified and tortuous aorta. Degenerative changes in the thoracic spine with thoracic scoliosis convex towards the right. IMPRESSION: Cardiac enlargement with mild pulmonary vascular congestion and interstitial edema. Electronically Signed   By: Lucienne Capers M.D.   On: 08/11/2016 04:17   Disposition   Pt is being discharged home today in good condition.  Follow-up Plans & Appointments    Follow-up Information    Barrett, Suanne Marker, PA-C Follow up on 08/20/2016.   Specialties:  Cardiology, Radiology Why:  at 8:45 am for your hospital follow up appt.  Contact information: [address] [place] [address] [phone]          Discharge Instructions    (HEART FAILURE PATIENTS) Call MD:  Anytime you have any of the following symptoms: 1) 3 pound weight gain in 24 hours or 5 pounds in 1 week 2) shortness of breath, with or without a dry hacking cough 3) swelling in the hands, feet or  stomach 4) if you have to sleep on extra pillows at night in order to breathe.    Complete by:  As directed    Amb referral to [facility]    Complete by:  As directed    Call MD for:  persistant dizziness or light-headedness    Complete by:  As directed    Diet - low sodium heart healthy    Complete by:  As directed    Increase activity slowly    Complete by:  As directed       Discharge Medications   Current Discharge Medication List    START taking these medications   Details  apixaban (ELIQUIS) 5 MG TABS tablet Take 1 tablet (5 mg total) by mouth 2 (two) times daily. Qty: 60 tablet, Refills: 6    furosemide (LASIX) 40 MG tablet Take 1 tablet (40 mg total) by mouth daily. Qty: 30 tablet, Refills: 6    metoprolol tartrate (LOPRESSOR) 25 MG tablet Take 1 tablet (25 mg total) by mouth 2 (two) times daily. Qty: 60 tablet, Refills: 6    potassium chloride SA (K-DUR,KLOR-CON) 20 MEQ tablet Take 2 tablets (40 mEq total) by mouth daily. Qty: 30 tablet, Refills: 6      CONTINUE these medications which have NOT CHANGED   Details  acetaminophen (TYLENOL) 500 MG tablet Take 500 mg by mouth every 6 (six) hours as needed for moderate pain.     brimonidine (ALPHAGAN) 0.2 % ophthalmic solution Place 2 drops into both eyes daily.     cetirizine (ZYRTEC) 10 MG tablet Take 10 mg by mouth daily.    Cholecalciferol 5000 units TABS Take 5,000 Units by mouth every evening.    diazepam (VALIUM) 5 MG tablet Take 2.5 mg by mouth every 6 (six) hours as needed for anxiety or sedation.     dorzolamide-timolol (COSOPT) 22.3-6.8 MG/ML ophthalmic solution Place 1 drop into both eyes 2 (two) times daily.     latanoprost (XALATAN) 0.005 % ophthalmic solution Place 1 drop into both eyes at bedtime.    levothyroxine (SYNTHROID, LEVOTHROID) 88 MCG tablet Take 88 mcg by mouth daily before breakfast.    Multiple Vitamin (MULTIVITAMIN WITH MINERALS) TABS tablet Take 1 tablet by mouth daily.      Multiple Vitamins-Minerals (PRESERVISION AREDS 2) CAPS Take 1 capsule by mouth 2 (two) times daily.    NITROSTAT 0.4 MG SL tablet Place 0.4 mg under the tongue every 5 (five) minutes as needed for chest pain.     omeprazole (PRILOSEC)  20 MG capsule Take 20 mg by mouth daily as needed (heartburn).     prednisoLONE acetate (PRED FORTE) 1 % ophthalmic suspension Place 1 drop into the left eye 2 (two) times daily.    rosuvastatin (CRESTOR) 20 MG tablet Take 20 mg by mouth every evening.    traZODone (DESYREL) 50 MG tablet Take 25-50 mg by mouth at bedtime as needed for sleep.    NON FORMULARY Inhale 1 application into the lungs at bedtime. CPAP machine       STOP taking these medications     aspirin 81 MG tablet      hydrALAZINE (APRESOLINE) 10 MG tablet           Outstanding Labs/Studies   BMET at follow up visit  Duration of Discharge Encounter   Greater than 30 minutes including physician time.  Signed, Reino Bellis NP-C 08/13/2016, 3:14 PM

## 2016-08-14 ENCOUNTER — Telehealth: Payer: Self-pay | Admitting: Cardiovascular Disease

## 2016-08-14 NOTE — Telephone Encounter (Signed)
°*  STAT* If patient is at the pharmacy, call can be transferred to refill team.   1. Which medications need to be refilled? (please list name of each medication and dose if known) This is a new Prescription  For Eliquis- The nurse will need to call insurance company for prior authorization-[phone]-Pt needs this medicine asap,please call pt when this is completed  2. Which pharmacy/location (including street and city if local pharmacy) is medication to be sent to?[phone]  3. Do they need a 30 day or 90 day supply? Whatever prescription called for

## 2016-08-14 NOTE — Telephone Encounter (Signed)
Called [facility] got prior authorization for Eliquis using ICD 10 code I48.92. Authorization number  [id number] through Oct 04, 2017. Member ID # [id number].  Called [facility] and it went through.  NOtified patient husband that he should be able to pick up medication from pharmacy.

## 2016-08-17 ENCOUNTER — Telehealth: Payer: Self-pay | Admitting: Cardiovascular Disease

## 2016-08-17 NOTE — Telephone Encounter (Signed)
Pt recently discharged from hospital for new onset A Fib. She was prescribed Eliquis at discharge and notes her discharge summary was brief, and she was nervous about taking the medication - not all of her questions were answered.  She does state she has been taking as prescribed except that she was not aware doses needed to be ~12 hrs apart. We discussed BID dosing and she voices understanding of this. I also explained purpose and action of medication. She is aware of potential SEs. All of her questions were answered to her satisfaction. She will see Kerin Ransom for f/u visit on Wednesday - & is aware she may call if questions in interim.

## 2016-08-17 NOTE — Telephone Encounter (Signed)
Heather ( Daughter ) is calling because Mrs. Ruediger is really nervous about taking the Eliquis and the side effects that may come with the medication . Please call   Thanks

## 2016-08-17 NOTE — Telephone Encounter (Signed)
New message  Pt daughter call requesting a call back to the pt. Pt daughter would like pt to be seen today 11/13. Pt daughter states she spoke with pt who voiced concerns with medication eliquis. Pt daughter states pt is wanting to sleep in the recliner. Please call back to discuss

## 2016-08-19 ENCOUNTER — Encounter: Payer: Self-pay | Admitting: Student

## 2016-08-19 ENCOUNTER — Ambulatory Visit (INDEPENDENT_AMBULATORY_CARE_PROVIDER_SITE_OTHER): Payer: Medicare Other | Admitting: Student

## 2016-08-19 VITALS — BP 116/64 | HR 55 | Ht 62.5 in | Wt 211.0 lb

## 2016-08-19 DIAGNOSIS — Z7901 Long term (current) use of anticoagulants: Secondary | ICD-10-CM

## 2016-08-19 DIAGNOSIS — I480 Paroxysmal atrial fibrillation: Secondary | ICD-10-CM

## 2016-08-19 DIAGNOSIS — E782 Mixed hyperlipidemia: Secondary | ICD-10-CM

## 2016-08-19 DIAGNOSIS — Z5181 Encounter for therapeutic drug level monitoring: Secondary | ICD-10-CM | POA: Diagnosis not present

## 2016-08-19 DIAGNOSIS — I5042 Chronic combined systolic (congestive) and diastolic (congestive) heart failure: Secondary | ICD-10-CM | POA: Diagnosis not present

## 2016-08-19 DIAGNOSIS — I10 Essential (primary) hypertension: Secondary | ICD-10-CM

## 2016-08-19 LAB — BASIC METABOLIC PANEL
BUN: 32 mg/dL — ABNORMAL HIGH (ref 7–25)
CALCIUM: 10.3 mg/dL (ref 8.6–10.4)
CO2: 26 mmol/L (ref 20–31)
Chloride: 103 mmol/L (ref 98–110)
Creat: 1.54 mg/dL — ABNORMAL HIGH (ref 0.60–0.93)
Glucose, Bld: 123 mg/dL — ABNORMAL HIGH (ref 65–99)
POTASSIUM: 4.8 mmol/L (ref 3.5–5.3)
SODIUM: 142 mmol/L (ref 135–146)

## 2016-08-19 LAB — CBC
HCT: 37.3 % (ref 35.0–45.0)
HEMOGLOBIN: 11.6 g/dL — AB (ref 11.7–15.5)
MCH: 32.2 pg (ref 27.0–33.0)
MCHC: 31.1 g/dL — ABNORMAL LOW (ref 32.0–36.0)
MCV: 103.6 fL — ABNORMAL HIGH (ref 80.0–100.0)
MPV: 10.1 fL (ref 7.5–12.5)
Platelets: 342 10*3/uL (ref 140–400)
RBC: 3.6 MIL/uL — AB (ref 3.80–5.10)
RDW: 14.3 % (ref 11.0–15.0)
WBC: 6.4 10*3/uL (ref 3.8–10.8)

## 2016-08-19 MED ORDER — METOPROLOL SUCCINATE ER 25 MG PO TB24: 25.0000 mg | ORAL_TABLET | Freq: Every day | ORAL | 3 refills | Status: DC

## 2016-08-19 MED ORDER — METOPROLOL TARTRATE 25 MG PO TABS: 25.0000 mg | ORAL_TABLET | ORAL | Status: DC | PRN

## 2016-08-19 NOTE — Progress Notes (Signed)
Cardiology Office Note    Date:  08/19/2016   ID:  Amy Hahn, DOB 06/25/1936, MRN [id number]  PCP:  [name], MD  Cardiologist: Dr. Gwenlyn Found   Chief Complaint  Patient presents with  . Hospitalization Follow-up    History of Present Illness:    Amy Hahn is a 80 y.o. female with past medical history of PAF (on Eliquis), chronic combined systolic and diastolic CHF, HTN, HLD, and carotid artery stenosis who presents to the office today for hospital follow-up.   She was recently admitted to [facility] from 08/11/2016 - 08/13/2016 for PAF and acute systolic CHF. Had initially presented for evaluation of chest discomfort, palpitations, and dyspnea. Her EKG showed an atrial arrhythmia and she was initially thought to have atrial fibrillation with RVR but upon further review this was found to be a sinus tachycardia with PAC's (EP was formally consulted and they were in agreement with this assessment). However, later during the hospitalization she did develop atrial fibrillation which was confirmed with an EKG. With her CHA2DS2-VASc Score of 4, Eliquis 5mg  BID was initiated. Echo showed a reduced EF of 35-40% with diffuse HK and akinesis of the mid inferoseptal and apical myocardium (EF previously 45-50% by echo in 11/2015). With her BNP being elevated to 1278 on admission, she was diuresed with IV Lasix and had a net output of -4.9L during the admission. Weight at time of discharge was 212 lbs and she was discharged home on PO Lasix 40mg  daily.   Today, Mrs. [name] reports overall doing well since her recent hospitalization. Her episodes of palpitations have decreased in frequency and severity. Only notes one episode since hospital discharge which lasted for 12-15 minuets then resolved spontaneously. Denies any chest discomfort. Does have dyspnea with exertion which is not new, currently at baseline. According to the patient and her daughter she is very inactive, mostly sitting in her  recliner throughout the day and only walking to the restroom and kitchen as needed. She does not weight herself at home but weight today is stable at 211 lbs (she plans to purchase a scale today). Reports having good urine output with the Lasix. Has stable orthopnea but denies any acute worsening of her symptoms. No lower extremity edema or PND.   She reports an intolerance to Coumadin in the past and was concerned about being started on Eliquis. She appears to be tolerating this well, denying any evidence of active bleeding or dark,tarry stools. Does have easy bruising.   Past Medical History:  Diagnosis Date  . Anxiety   . Atrial fibrillation ([place])    a. paroxysmal, diagnosed in 08/2016 and started on Eliquis  . Breast cancer ([place])   . Cataracts, bilateral   . Chronic combined systolic and diastolic CHF (congestive heart failure) ([place])    a. echo 11/2015: EF 45-50%  b. echo 08/2016: EF 35-40%, diffuse HK, akinesis of mid inferoseptal and apical septal myocardium, mild AR, mild MR, LA moderately dilated.  . Complication of anesthesia   . GERD (gastroesophageal reflux disease)   . Glaucoma   . History of kidney stones    HISTORY OF  . Hyperlipidemia   . Hypertension   . Macular degeneration   . Nodule of right lung   . Obesity   . PONV (postoperative nausea and vomiting)   . Sleep apnea     Past Surgical History:  Procedure Laterality Date  . CATARACT EXTRACTION, BILATERAL    . CESAREAN SECTION    .  MASTECTOMY Right 1997 ?  [name] TOTAL KNEE ARTHROPLASTY     x2    Current Medications: Outpatient Medications Prior to Visit  Medication Sig Dispense Refill  . acetaminophen (TYLENOL) 500 MG tablet Take 500 mg by mouth every 6 (six) hours as needed for moderate pain.     [name] apixaban (ELIQUIS) 5 MG TABS tablet Take 1 tablet (5 mg total) by mouth 2 (two) times daily. 60 tablet 6  . brimonidine (ALPHAGAN) 0.2 % ophthalmic solution Place 2 drops into both eyes daily.     . cetirizine  (ZYRTEC) 10 MG tablet Take 10 mg by mouth daily.    . Cholecalciferol 5000 units TABS Take 5,000 Units by mouth every evening.    . diazepam (VALIUM) 5 MG tablet Take 2.5 mg by mouth every 6 (six) hours as needed for anxiety or sedation.     . dorzolamide-timolol (COSOPT) 22.3-6.8 MG/ML ophthalmic solution Place 1 drop into both eyes 2 (two) times daily.     . furosemide (LASIX) 40 MG tablet Take 1 tablet (40 mg total) by mouth daily. 30 tablet 6  . latanoprost (XALATAN) 0.005 % ophthalmic solution Place 1 drop into both eyes at bedtime.    [name] levothyroxine (SYNTHROID, LEVOTHROID) 88 MCG tablet Take 88 mcg by mouth daily before breakfast.    . Multiple Vitamin (MULTIVITAMIN WITH MINERALS) TABS tablet Take 1 tablet by mouth daily.    . Multiple Vitamins-Minerals (PRESERVISION AREDS 2) CAPS Take 1 capsule by mouth 2 (two) times daily.    [name] NITROSTAT 0.4 MG SL tablet Place 0.4 mg under the tongue every 5 (five) minutes as needed for chest pain.     [name] omeprazole (PRILOSEC) 20 MG capsule Take 20 mg by mouth daily as needed (heartburn).     . potassium chloride SA (K-DUR,KLOR-CON) 20 MEQ tablet Take 2 tablets (40 mEq total) by mouth daily. 30 tablet 6  . prednisoLONE acetate (PRED FORTE) 1 % ophthalmic suspension Place 1 drop into the left eye 2 (two) times daily.    . rosuvastatin (CRESTOR) 20 MG tablet Take 20 mg by mouth every evening.    . traZODone (DESYREL) 50 MG tablet Take 25-50 mg by mouth at bedtime as needed for sleep.    . metoprolol tartrate (LOPRESSOR) 25 MG tablet Take 1 tablet (25 mg total) by mouth 2 (two) times daily. 60 tablet 6  . NON FORMULARY Inhale 1 application into the lungs at bedtime. CPAP machine      No facility-administered medications prior to visit.      Allergies:   Amitriptyline; Edarbi [azilsartan]; Morphine and related; Amlodipine; Atenolol; Codeine; Doxazosin; Dyazide [hydrochlorothiazide w-triamterene]; Erythromycin; Labetalol; Levatol [penbutolol sulfate];  Losartan; Monopril [fosinopril]; Ace inhibitors; Ampicillin; Belviq [lorcaserin hcl]; Butrans [buprenorphine]; Clotrimazole-betamethasone; Elocon [mometasone furoate]; Latex; Penicillins; and Propranolol   Social History   Social History  . Marital status: Married    Spouse name: N/A  . Number of children: N/A  . Years of education: N/A   Social History Main Topics  . Smoking status: Former Smoker    Packs/day: 1.00    Types: Cigarettes    Start date: 08/09/1960  . Smokeless tobacco: Never Used  . Alcohol use No  . Drug use: No  . Sexual activity: Not on file   Other Topics Concern  . Not on file   Social History Narrative  . No narrative on file     Family History:  The patient's family history includes Cancer in her father; Diabetes  in her brother, brother, mother, and sister; Heart attack in her brother and father; Macular degeneration in her mother.   Review of Systems:   Please see the history of present illness.     General:  No chills, fever, night sweats or weight changes. Positive for easy bruising.  Cardiovascular:  No chest pain, edema, orthopnea, paroxysmal nocturnal dyspnea. Positive for palpitations and dyspnea with exertion.  Dermatological: No rash, lesions/masses Respiratory: No cough, dyspnea Urologic: No hematuria, dysuria Abdominal:   No nausea, vomiting, diarrhea, bright red blood per rectum, melena, or hematemesis Neurologic:  No visual changes, wkns, changes in mental status. All other systems reviewed and are otherwise negative except as noted above.   Physical Exam:    VS:  BP 116/64   Pulse (!) 55   Ht 5' 2.5" (1.588 m)   Wt 211 lb (95.7 kg)   BMI 37.98 kg/m    General: Overweight Caucasian female appearing in no acute distress. Head: Normocephalic, atraumatic, sclera non-icteric, no xanthomas, nares are without discharge.  Neck: No carotid bruits. JVD at 8cm.  Lungs: Respirations regular and unlabored, without wheezes or rales.  Heart:  Irregularly irregular. No S3 or S4.  No murmur, no rubs, or gallops appreciated. Abdomen: Soft, non-tender, non-distended with normoactive bowel sounds. No hepatomegaly. No rebound/guarding. No obvious abdominal masses. Msk:  Strength and tone appear normal for age. No joint deformities or effusions. Extremities: No clubbing or cyanosis. Trace bilateral lower extremity edema.  Distal pedal pulses are 2+ bilaterally. Neuro: Alert and oriented X 3. Moves all extremities spontaneously. No focal deficits noted. Psych:  Responds to questions appropriately with a normal affect. Skin: No rashes or lesions noted  Wt Readings from Last 3 Encounters:  08/19/16 211 lb (95.7 kg)  08/13/16 212 lb 6.4 oz (96.3 kg)  07/07/16 220 lb (99.8 kg)    Studies/Labs Reviewed:   EKG:  EKG is ordered today. The ekg ordered today demonstrates atrial fibrillation, HR 55, 1.7 second pause with TWI in the inferior and lateral leads.   Recent Labs: 06/30/2016: ALT 15; TSH 3.730 08/11/2016: B Natriuretic Peptide 1,278.6 08/13/2016: BUN 29; Creatinine, Ser 1.58; Hemoglobin 11.4; Platelets 285; Potassium 3.9; Sodium 137   Lipid Panel No results found for: CHOL, TRIG, HDL, CHOLHDL, VLDL, LDLCALC, LDLDIRECT  Additional studies/ records that were reviewed today include:   Echocardiogram: 08/2016 Study Conclusions  - Left ventricle: The cavity size was mildly dilated. There was   mild concentric hypertrophy. Systolic function was moderately   reduced. The estimated ejection fraction was in the range of 35%   to 40%. Diffuse hypokinesis. There is akinesis of the mid   inferoseptal and apical septal myocardium. Normal sinus rhythm   was absent. The study is not technically sufficient to allow   evaluation of LV diastolic function. - Aortic valve: There was mild regurgitation. - Mitral valve: There was mild regurgitation. - Left atrium: The atrium was moderately dilated.  Assessment:    1. Chronic combined systolic  and diastolic CHF (congestive heart failure) (HCC)   2. Paroxysmal atrial fibrillation ([place])   3. Chronic anticoagulation   4. Medication monitoring encounter   5. Essential hypertension   6. Mixed hyperlipidemia      Plan:   In order of problems listed above:  1. Chronic Combined Systolic and Diastolic CHF - recent echo during admission showed an EF of 35-40% with diffuse HK and akinesis of the mid inferoseptal and apical myocardium (EF previously 45-50% by echo in 11/2015).  Diuresed with IV Lasix during admission with weight at time of discharge being 212 lbs.  - weight remains stable at 211 lbs today and she is tolerating Lasix 40mg  daily well. Has trace edema on exam but lungs are clear. Will check BMET to assess kidney function and K+ levels. Instructed to take extra 40mg  Lasix tablet if weight gain of > 3 lbs overnight. Reviewed sodium and fluid restriction.  - will switch from Metoprolol Tartrate to Metoprolol Succinate (Toprol-XL) 25mg  daily with her reduced EF. Has been intolerant to  ACE-I/ARB's in the past. Will re-challenge with a different ARB at her next office visit if BP allows or consider the addition of Entresto. Etiology of her reduced EF is unclear as this could be ischemic vs. tachycardiac induced. She denies any recent episodes of chest discomfort but her EKG does demonstrate new TWI, however she had a low-risk NST in 09/2015.   2. Paroxysmal Atrial Fibrillation/ Chronic Anticoagulation - This patients CHA2DS2-VASc Score and unadjusted Ischemic Stroke Rate (% per year) is equal to 7.2 % stroke rate/year from a score of 5 (CHF, HTN, Female, Age (2)). Continue Eliquis for anticoagulation. She denies any evidence of active bleeding. - Toprol-XL 25mg  daily for rate-control. May need to be further titrated at next follow-up appointment. She remains in atrial fibrillation today and appears to be tolerating this well. She has only been on anticoagulation for 1 week. Could consider  possible DCCV later on but unsure how long she would maintain NSR with her moderately dilated LA without antiarrhythmic therapy.  - she does have a 1.7 second pause noted on her EKG today. Denies any episodes of dizziness, lightheadedness, or presyncope. May need to consider a Holter monitor to assess for longer pauses if she develops any symptoms concerning for this.   3. HTN - BP well-controlled at today's visit.  - switch from short-acting Lopressor to Toprol-XL 25 mg daily as above.   4. HLD - continue statin therapy. Followed by PCP.    Medication Adjustments/Labs and Tests Ordered: Current medicines are reviewed at length with the patient today.  Concerns regarding medicines are outlined above.  Medication changes, Labs and Tests ordered today are listed in the Patient Instructions below. Patient Instructions  Medication Instructions:  1. CHANGE TAKING THE LOPRESSOR (METOPROLOL TARTRATE) TO TAKE ONLY AS NEEDED FOR PALPITATIONS  2. START TOPROL XL 25 MG DAILY  Labwork: TODAY BMET, CBC   Testing/Procedures: NONE  Follow-Up: YOU HAVE AN APPT WITH Bernerd Pho, PAC 09/10/16 @ 3 PM   DR. BERRY ON 10/14/16 @ 2:15   Any Other Special Instructions Will Be Listed Below (If Applicable).  If you need a refill on your cardiac medications before your next appointment, please call your pharmacy.    Signed, Erma Heritage, PA  08/19/2016 5:06 PM    [facility] [address], [place], [place]  [address] Phone: [phone]; Fax: [phone]  [address] [place], [place] [address] Phone: [phone]

## 2016-08-19 NOTE — Patient Instructions (Addendum)
Medication Instructions:  1. CHANGE TAKING THE LOPRESSOR (METOPROLOL TARTRATE) TO TAKE ONLY AS NEEDED FOR PALPITATIONS  2. START TOPROL XL 25 MG DAILY    Labwork: TODAY BMET, CBC   Testing/Procedures: NONE  Follow-Up: YOU HAVE AN APPT WITH Bernerd Pho, PAC 09/10/16 @ 3 PM   DR. BERRY ON 10/14/16 @ 2:15   Any Other Special Instructions Will Be Listed Below (If Applicable).     If you need a refill on your cardiac medications before your next appointment, please call your pharmacy.

## 2016-08-20 ENCOUNTER — Ambulatory Visit: Payer: Medicare Other | Admitting: Physician Assistant

## 2016-08-21 ENCOUNTER — Telehealth: Payer: Self-pay | Admitting: Student

## 2016-08-21 MED ORDER — METOPROLOL TARTRATE 25 MG PO TABS: ORAL_TABLET | ORAL | 3 refills | Status: DC

## 2016-08-21 NOTE — Telephone Encounter (Signed)
Pt has been made aware to switch back to the Lopressor 25 bid with an extra tablet as needed daily for palpitations granted the hr should be above 60 and her systolic bp should be above 100.  Pt is agreeable with this plan and med changes made to pts list.

## 2016-08-21 NOTE — Telephone Encounter (Signed)
Yes, please have her stop the Toprol-XL and resume Lopressor 25mg  BID. She can still take an extra Lopressor tablet if she is having palpitations (granted, HR should be greater than 60 and SBP > 100 if she is going to do this). Will avoid switching to Coreg or a different BB with multiple allergies. Thanks!

## 2016-08-21 NOTE — Telephone Encounter (Signed)
New message   Pt verbalized that she was seen   Pt c/o medication issue:  1. Name of Medication: Metoprolol  extended release     Lopressure  2. How are you currently taking this medication (dosage and times per day)? 25mg  1x day  3. Are you having a reaction (difficulty breathing--STAT)? no  4. What is your medication issue? Rash on her bottom and back

## 2016-08-21 NOTE — Telephone Encounter (Signed)
Pt called and she stated the Toprol XL you started her on, broke her out from her bottom all up to her waist and it was itching really bad, started yesterday and pt had to take Benadryl so she could sleep last night.  Pt went back to her Lopressor this morning.   Please advise!

## 2016-08-25 ENCOUNTER — Other Ambulatory Visit: Payer: Self-pay | Admitting: Internal Medicine

## 2016-08-25 DIAGNOSIS — Z1231 Encounter for screening mammogram for malignant neoplasm of breast: Secondary | ICD-10-CM

## 2016-09-10 ENCOUNTER — Ambulatory Visit: Payer: Medicare Other | Admitting: Student

## 2016-09-30 ENCOUNTER — Ambulatory Visit (INDEPENDENT_AMBULATORY_CARE_PROVIDER_SITE_OTHER): Payer: Medicare Other | Admitting: Student

## 2016-09-30 ENCOUNTER — Encounter: Payer: Self-pay | Admitting: Student

## 2016-09-30 VITALS — BP 112/84 | HR 67 | Ht 62.0 in | Wt 208.2 lb

## 2016-09-30 DIAGNOSIS — I480 Paroxysmal atrial fibrillation: Secondary | ICD-10-CM

## 2016-09-30 DIAGNOSIS — Z79899 Other long term (current) drug therapy: Secondary | ICD-10-CM | POA: Diagnosis not present

## 2016-09-30 DIAGNOSIS — I5042 Chronic combined systolic (congestive) and diastolic (congestive) heart failure: Secondary | ICD-10-CM | POA: Insufficient documentation

## 2016-09-30 DIAGNOSIS — R002 Palpitations: Secondary | ICD-10-CM | POA: Diagnosis not present

## 2016-09-30 DIAGNOSIS — I10 Essential (primary) hypertension: Secondary | ICD-10-CM | POA: Insufficient documentation

## 2016-09-30 LAB — BASIC METABOLIC PANEL
BUN: 22 mg/dL (ref 7–25)
CALCIUM: 9.3 mg/dL (ref 8.6–10.4)
CO2: 22 mmol/L (ref 20–31)
CREATININE: 1.32 mg/dL — AB (ref 0.60–0.93)
Chloride: 105 mmol/L (ref 98–110)
GLUCOSE: 107 mg/dL — AB (ref 65–99)
Potassium: 4.5 mmol/L (ref 3.5–5.3)
SODIUM: 142 mmol/L (ref 135–146)

## 2016-09-30 NOTE — Patient Instructions (Addendum)
Your physician recommends that you return for lab work TODAY (BMET)

## 2016-09-30 NOTE — Progress Notes (Signed)
Cardiology Office Note    Date:  09/30/2016   ID:  Amy Hahn, DOB 12-27-1935, MRN [id number]  PCP:  [name], MD  Cardiologist: Dr. Gwenlyn Found    Chief Complaint  Patient presents with  . Follow-up    pt states she woke up at 5 AM on christmas morning due to her palpatations that lasted for 7 hrs   . Shortness of Breath    always SOB     History of Present Illness:    Amy Hahn is a 80 y.o. female with past medical history of PAF (on Eliquis), chronic combined systolic and diastolic CHF (EF [id number] by echo in 08/2016), HTN, HLD, and carotid artery stenosis who presents to the office today for routine follow-up.   Was last seen in the office on 08/19/2016 following her recent hospitalization for acute CHF exacerbation where she was found to be in new onset atrial fibrillation. At her office visit, weight was stable at 211 lbs and she was continued on PO Lasix 40mg  daily. We attempted Metoprolol Tartrate to Toprol-XL but she developed a rash to this and therefore resumed Lopressor 25mg  BID.   In talking with the patient today, she reports doing well from a cardiac perspective until this past Sunday. She awoke with palpitations and felt like her heart was beating out of her chest. This lasted for 5-6 hours. She called her family and they were going to take her to the emergency department, but her palpitations resolved with sitting down to urinate. Did not have a bowel movement at that time. She did not take any additional Lopressor as she was not sure what dose to take. She had associated dyspnea at that time. She denies any repeat episodes of palpitations since. No chest discomfort, presyncope, or falls.  She denies any recent orthopnea, PND, dyspnea with exertion, or lower extremity edema. Weight has trended down since her last office visit from 211 pounds to 208 pounds today. She did purchase home scales and is recording her weight daily.   Past Medical History:  Diagnosis Date  .  Anxiety   . Atrial fibrillation ([place])    a. paroxysmal, diagnosed in 08/2016 and started on Eliquis  . Breast cancer ([place])   . Cataracts, bilateral   . Chronic combined systolic and diastolic CHF (congestive heart failure) ([place])    a. echo 11/2015: EF 45-50%  b. echo 08/2016: EF 35-40%, diffuse HK, akinesis of mid inferoseptal and apical septal myocardium, mild AR, mild MR, LA moderately dilated.  . Complication of anesthesia   . GERD (gastroesophageal reflux disease)   . Glaucoma   . History of kidney stones    HISTORY OF  . Hyperlipidemia   . Hypertension   . Macular degeneration   . Nodule of right lung   . Obesity   . PONV (postoperative nausea and vomiting)   . Sleep apnea     Past Surgical History:  Procedure Laterality Date  . CATARACT EXTRACTION, BILATERAL    . CESAREAN SECTION    . MASTECTOMY Right 1997 ?  [name] TOTAL KNEE ARTHROPLASTY     x2    Current Medications: Outpatient Medications Prior to Visit  Medication Sig Dispense Refill  . acetaminophen (TYLENOL) 500 MG tablet Take 500 mg by mouth every 6 (six) hours as needed for moderate pain.     [name] apixaban (ELIQUIS) 5 MG TABS tablet Take 1 tablet (5 mg total) by mouth 2 (two) times daily. 60 tablet 6  .  brimonidine (ALPHAGAN) 0.2 % ophthalmic solution Place 2 drops into both eyes daily.     . cetirizine (ZYRTEC) 10 MG tablet Take 10 mg by mouth daily.    . Cholecalciferol 5000 units TABS Take 5,000 Units by mouth every evening.    . diazepam (VALIUM) 5 MG tablet Take 2.5 mg by mouth every 6 (six) hours as needed for anxiety or sedation.     . dorzolamide-timolol (COSOPT) 22.3-6.8 MG/ML ophthalmic solution Place 1 drop into both eyes 2 (two) times daily.     . furosemide (LASIX) 40 MG tablet Take 1 tablet (40 mg total) by mouth daily. 30 tablet 6  . latanoprost (XALATAN) 0.005 % ophthalmic solution Place 1 drop into both eyes at bedtime.    [name] levothyroxine (SYNTHROID, LEVOTHROID) 88 MCG tablet Take 88 mcg by mouth  daily before breakfast.    . metoprolol tartrate (LOPRESSOR) 25 MG tablet Take 1 tablet by mouth twice a day and take 1 tablet daily AS NEEDED FOR PALPITATIONS 270 tablet 3  . Multiple Vitamin (MULTIVITAMIN WITH MINERALS) TABS tablet Take 1 tablet by mouth daily.    . Multiple Vitamins-Minerals (PRESERVISION AREDS 2) CAPS Take 1 capsule by mouth 2 (two) times daily.    [name] NITROSTAT 0.4 MG SL tablet Place 0.4 mg under the tongue every 5 (five) minutes as needed for chest pain.     . NON FORMULARY Inhale 1 application into the lungs at bedtime. CPAP machine     . omeprazole (PRILOSEC) 20 MG capsule Take 20 mg by mouth daily as needed (heartburn).     . potassium chloride SA (K-DUR,KLOR-CON) 20 MEQ tablet Take 2 tablets (40 mEq total) by mouth daily. 30 tablet 6  . prednisoLONE acetate (PRED FORTE) 1 % ophthalmic suspension Place 1 drop into the left eye 2 (two) times daily.    . rosuvastatin (CRESTOR) 20 MG tablet Take 20 mg by mouth every evening.    . traZODone (DESYREL) 50 MG tablet Take 25-50 mg by mouth at bedtime as needed for sleep.     No facility-administered medications prior to visit.      Allergies:   Amitriptyline; Edarbi [azilsartan]; Morphine and related; Amlodipine; Atenolol; Codeine; Doxazosin; Dyazide [hydrochlorothiazide w-triamterene]; Erythromycin; Labetalol; Levatol [penbutolol sulfate]; Losartan; Monopril [fosinopril]; Ace inhibitors; Ampicillin; Belviq [lorcaserin hcl]; Butrans [buprenorphine]; Clotrimazole-betamethasone; Elocon [mometasone furoate]; Latex; Penicillins; and Propranolol   Social History   Social History  . Marital status: Married    Spouse name: N/A  . Number of children: N/A  . Years of education: N/A   Social History Main Topics  . Smoking status: Former Smoker    Packs/day: 1.00    Types: Cigarettes    Start date: 08/09/1960  . Smokeless tobacco: Never Used  . Alcohol use No  . Drug use: No  . Sexual activity: Not Asked   Other Topics Concern    . None   Social History Narrative  . None     Family History:  The patient's family history includes Cancer in her father; Diabetes in her brother, brother, mother, and sister; Heart attack in her brother and father; Macular degeneration in her mother.   Review of Systems:   Please see the history of present illness.     General:  No chills, fever, night sweats or weight changes.  Cardiovascular:  No chest pain, dyspnea on exertion, edema, orthopnea, paroxysmal nocturnal dyspnea. Positive for palpitations.  Dermatological: No rash, lesions/masses Respiratory: No cough, dyspnea Urologic: No hematuria, dysuria Abdominal:  No nausea, vomiting, diarrhea, bright red blood per rectum, melena, or hematemesis Neurologic:  No visual changes, wkns, changes in mental status. All other systems reviewed and are otherwise negative except as noted above.   Physical Exam:    VS:  BP 112/84   Pulse 67   Ht 5\' 2"  (1.575 m)   Wt 208 lb 3.2 oz (94.4 kg)   SpO2 96%   BMI 38.08 kg/m    General: Well developed, well nourished,female appearing in no acute distress. Head: Normocephalic, atraumatic, sclera non-icteric, no xanthomas, nares are without discharge.  Neck: No carotid bruits. JVD not elevated.  Lungs: Respirations regular and unlabored, without wheezes or rales.  Heart: Irregularly irregular. No S3 or S4.  No murmur, no rubs, or gallops appreciated. Abdomen: Soft, non-tender, non-distended with normoactive bowel sounds. No hepatomegaly. No rebound/guarding. No obvious abdominal masses. Msk:  Strength and tone appear normal for age. No joint deformities or effusions. Extremities: No clubbing or cyanosis. No edema.  Distal pedal pulses are 2+ bilaterally. Neuro: Alert and oriented X 3. Moves all extremities spontaneously. No focal deficits noted. Psych:  Responds to questions appropriately with a normal affect. Skin: No rashes or lesions noted  Wt Readings from Last 3 Encounters:   09/30/16 208 lb 3.2 oz (94.4 kg)  08/19/16 211 lb (95.7 kg)  08/13/16 212 lb 6.4 oz (96.3 kg)     Studies/Labs Reviewed:   EKG:  EKG is not ordered today.    Recent Labs: 06/30/2016: ALT 15; TSH 3.730 08/11/2016: B Natriuretic Peptide 1,278.6 08/19/2016: BUN 32; Creat 1.54; Hemoglobin 11.6; Platelets 342; Potassium 4.8; Sodium 142   Lipid Panel No results found for: CHOL, TRIG, HDL, CHOLHDL, VLDL, LDLCALC, LDLDIRECT  Additional studies/ records that were reviewed today include:   Echocardiogram: 08/13/2016 Study Conclusions  - Left ventricle: The cavity size was mildly dilated. There was mild concentric hypertrophy. Systolic function was moderately reduced. The estimated ejection fraction was in the range of 35% to 40%. Diffuse hypokinesis. There is akinesis of the mid inferoseptal and apical septal myocardium. Normal sinus rhythm was absent. The study is not technically sufficient to allow evaluation of LV diastolic function. - Aortic valve: There was mild regurgitation. - Mitral valve: There was mild regurgitation. - Left atrium: The atrium was moderately dilated.   Assessment:    1. Chronic combined systolic and diastolic CHF (congestive heart failure) (HCC)   2. Palpitations   3. PAF (paroxysmal atrial fibrillation) ([place])   4. Medication management   5. Essential hypertension      Plan:   In order of problems listed above:  1. Chronic Combined Systolic and Diastolic CHF - known reduced EF of 35-40% by echo in 08/2016. Weights have continued to decrease from 211 lbs at last office visit to 208 lbs today. She does not appear volume overloaded on physical exam. Denies any recent orthopnea, PND, or lower extremity edema. Has chronic DOE.  - attempted to switch from short-acting Lopressor to Toprol-XL at last office visit due to her reduced EF but unable to tolerate this secondary to a rash. Intolerant to Coreg, multiple ACE-I's and ARB's in the past.  Will continue with her Lopressor 25mg  BID and Lasix 40mg  daily. Could attempt initiation of Entresto in the future with her multiple drug allergies.  2. Palpitations - had an episode of palpitations this past Sunday which lasted for 5-6 hours and resolved with her sitting down to urinate. No associated chest discomfort or dyspnea. Denies any repeat episodes  of palpitations since. Says this has never occurred before.  - recent TSH normal. Recheck electrolytes today.  - informed the patient to take an additional Lopressor 25mg  as needed for palpitations. If she has recurrent episodes, could consider event monitoring at that time.   3. Paroxysmal Atrial Fibrillation - This patients CHA2DS2-VASc Score and unadjusted Ischemic Stroke Rate (% per year) is equal to 7.2 % stroke rate/year from a score of 5 (CHF, HTN, Female, Age (2)). Continue Eliquis for anticoagulation.  - continue Lopressor 25mg  BID for rate control.   4. HTN - BP well-controlled at 112/84 today. She does check her BP at home occasionally but says her cuff is "usually broken".  - continue current medication regimen.   5. Stage 3 CKD - baseline 1.2-1.4. Elevated to 1.54 on 08/19/2016. - recheck BMET today.     Medication Adjustments/Labs and Tests Ordered: Current medicines are reviewed at length with the patient today.  Concerns regarding medicines are outlined above.  Medication changes, Labs and Tests ordered today are listed in the Patient Instructions below.  Patient Instructions  Your physician recommends that you return for lab work TODAY (BMET)   Signed, Erma Heritage, [place]  09/30/2016 7:36 PM    [facility] [address], [place], [place]  [address] Phone: [phone]; Fax: [phone]  [address], [place], [place] [address] Phone: [phone]

## 2016-10-06 ENCOUNTER — Ambulatory Visit: Payer: Medicare Other

## 2016-10-14 ENCOUNTER — Ambulatory Visit: Payer: Medicare Other | Admitting: Cardiovascular Disease

## 2016-10-26 NOTE — Addendum Note (Signed)
Addended by: Waylan Rocher on: 10/26/2016 08:32 AM   Modules accepted: Orders

## 2016-11-25 ENCOUNTER — Ambulatory Visit (INDEPENDENT_AMBULATORY_CARE_PROVIDER_SITE_OTHER): Payer: Medicare Other | Admitting: Cardiovascular Disease

## 2016-11-25 ENCOUNTER — Encounter: Payer: Self-pay | Admitting: Cardiovascular Disease

## 2016-11-25 VITALS — BP 152/88 | HR 64 | Ht 62.0 in | Wt 208.8 lb

## 2016-11-25 DIAGNOSIS — I5021 Acute systolic (congestive) heart failure: Secondary | ICD-10-CM | POA: Diagnosis not present

## 2016-11-25 DIAGNOSIS — E7800 Pure hypercholesterolemia, unspecified: Secondary | ICD-10-CM

## 2016-11-25 DIAGNOSIS — I10 Essential (primary) hypertension: Secondary | ICD-10-CM

## 2016-11-25 DIAGNOSIS — I480 Paroxysmal atrial fibrillation: Secondary | ICD-10-CM

## 2016-11-25 DIAGNOSIS — I6522 Occlusion and stenosis of left carotid artery: Secondary | ICD-10-CM | POA: Diagnosis not present

## 2016-11-25 MED ORDER — NITROGLYCERIN 0.4 MG SL SUBL: 0.4000 mg | SUBLINGUAL_TABLET | SUBLINGUAL | 6 refills | Status: DC | PRN

## 2016-11-25 NOTE — Assessment & Plan Note (Signed)
History of hypertension with blood pressure measured today 152/88. She is on metoprolol. Continue current meds at current dosing.

## 2016-11-25 NOTE — Assessment & Plan Note (Signed)
Recent admission for systolic heart failure with ejection fraction in the 35-40% range. She was diuresed down to her dry weight and remains on oral diuretics. She is aware of some restriction. She does have mild dyspnea on exertion but is at her dry weight and has no peripheral edema.

## 2016-11-25 NOTE — Assessment & Plan Note (Signed)
History of carotid artery disease Dopplers performed 07/29/16 revealing only mild left ICA stenosis. This will be repeated as needed.

## 2016-11-25 NOTE — Assessment & Plan Note (Signed)
Currently in persistent atrial fibrillation rate controlled on beta blocker and Eliquis oral anticoagulation.

## 2016-11-25 NOTE — Assessment & Plan Note (Signed)
History of hyperlipidemia on statin therapy followed by her PCP. 

## 2016-11-25 NOTE — Patient Instructions (Signed)
Medication Instructions: Your physician recommends that you continue on your current medications as directed. Please refer to the Current Medication list given to you today.  Follow-Up: We request that you follow-up in: 3 months with Bernerd Pho, PA and in 6 months with Dr Andria Rhein will receive a reminder letter in the mail two months in advance. If you don't receive a letter, please call our office to schedule the follow-up appointment.  If you need a refill on your cardiac medications before your next appointment, please call your pharmacy.

## 2016-11-25 NOTE — Progress Notes (Signed)
11/25/2016 Amy Hahn   07-Apr-1936  [id number]  Primary Physician Amy Gravel, MD Primary Cardiologist: Lorretta Harp MD Renae Gloss  HPI:   Amy Hahn is a 81 year old moderately overweight very Caucasian female mother of 61, grandmother and 3 grandchildren who i last saw in the office 07/07/16.[name] She was referred by Dr. Maudie Mercury for cardiovascular evaluation because of a recent episode of chest pain. Her risk factors include treated hypertension and hyperlipidemia as well as a strong family history of heart disease with a father who died of a myocardial infarction, a brother who's had stents and a son who's had a heart attack at age 29. She has never had a heart attack or stroke. She had recent nocturnal chest pain 2 days in a row resulting in ER evaluation. Her blood pressure has been high recently. Since I saw her approximately 6 weeks ago we obtained a Myoview stress test which was low risk although the ejection fraction was 40% by gated SPECT. After adjusting her antihypertensive medications for blood pressure came down nicely and her chest pain has resolved. She has done well since I last saw her in February of this year until recently when she was admitted with slurred speech and dehydration. Turns out that she was taking twice the dose of her oral diuretic. Her blood pressure medicines were adjusted and now she is only on hydralazine.  She was admitted to [facility]/11/16 for 2 days in heart failure and A. fib. Her EF was 35%. She was diuresed. She was placed on oral anticoagulation. Since discharge her weight has remained stable. She denies chest pain.  Current Outpatient Prescriptions  Medication Sig Dispense Refill  . acetaminophen (TYLENOL) 500 MG tablet Take 500 mg by mouth every 6 (six) hours as needed for moderate pain.     [name] apixaban (ELIQUIS) 5 MG TABS tablet Take 1 tablet (5 mg total) by mouth 2 (two) times daily. 60 tablet 6  . brimonidine (ALPHAGAN) 0.2 %  ophthalmic solution Place 2 drops into both eyes daily.     . cetirizine (ZYRTEC) 10 MG tablet Take 10 mg by mouth daily.    . Cholecalciferol 5000 units TABS Take 5,000 Units by mouth every evening.    . diazepam (VALIUM) 5 MG tablet Take 2.5 mg by mouth every 6 (six) hours as needed for anxiety or sedation.     . dorzolamide-timolol (COSOPT) 22.3-6.8 MG/ML ophthalmic solution Place 1 drop into both eyes 2 (two) times daily.     . furosemide (LASIX) 40 MG tablet Take 1 tablet (40 mg total) by mouth daily. 30 tablet 6  . latanoprost (XALATAN) 0.005 % ophthalmic solution Place 1 drop into both eyes at bedtime.    [name] levothyroxine (SYNTHROID, LEVOTHROID) 88 MCG tablet Take 88 mcg by mouth daily before breakfast.    . metoprolol tartrate (LOPRESSOR) 25 MG tablet Take 1 tablet by mouth twice a day and take 1 tablet daily AS NEEDED FOR PALPITATIONS 270 tablet 3  . Multiple Vitamin (MULTIVITAMIN WITH MINERALS) TABS tablet Take 1 tablet by mouth daily.    . Multiple Vitamins-Minerals (PRESERVISION AREDS 2) CAPS Take 1 capsule by mouth 2 (two) times daily.    . nitroGLYCERIN (NITROSTAT) 0.4 MG SL tablet Place 1 tablet (0.4 mg total) under the tongue every 5 (five) minutes as needed for chest pain. 25 tablet 6  . NON FORMULARY Inhale 1 application into the lungs at bedtime. CPAP machine     .  omeprazole (PRILOSEC) 20 MG capsule Take 20 mg by mouth daily as needed (heartburn).     . potassium chloride SA (K-DUR,KLOR-CON) 20 MEQ tablet Take 2 tablets (40 mEq total) by mouth daily. 30 tablet 6  . prednisoLONE acetate (PRED FORTE) 1 % ophthalmic suspension Place 1 drop into the left eye 2 (two) times daily.    . rosuvastatin (CRESTOR) 20 MG tablet Take 20 mg by mouth every evening.    . traZODone (DESYREL) 50 MG tablet Take 25-50 mg by mouth at bedtime as needed for sleep.     No current facility-administered medications for this visit.     Allergies  Allergen Reactions  . Amitriptyline Other (See  Comments)    Weakness   . Edarbi [Azilsartan] Other (See Comments)    Lupus  . Morphine And Related Other (See Comments)    "MAKES ME CRAZY"  . Amlodipine Other (See Comments)    Headache   . Atenolol Other (See Comments)    "decreases blood pressure"  . Codeine Nausea Only  . Doxazosin Other (See Comments)    Leg pain  . Dyazide [Hydrochlorothiazide W-Triamterene] Itching  . Erythromycin Diarrhea and Other (See Comments)    Also cramping  . Labetalol Other (See Comments)    Bradycardia   . Levatol [Penbutolol Sulfate] Other (See Comments)    Eye swelling  . Losartan Other (See Comments)    headache  . Monopril [Fosinopril] Hives  . Ace Inhibitors Cough  . Ampicillin Rash  . Belviq [Lorcaserin Hcl] Itching  . Butrans [Buprenorphine] Rash  . Clotrimazole-Betamethasone Rash  . Elocon [Mometasone Furoate] Rash  . Latex Rash    " IF ON ME FOR OVER 24 HOURS"  . Penicillins Other (See Comments)    "in large doses"  . Propranolol Itching    Social History   Social History  . Marital status: Married    Spouse name: N/A  . Number of children: N/A  . Years of education: N/A   Occupational History  . Not on file.   Social History Main Topics  . Smoking status: Former Smoker    Packs/day: 1.00    Types: Cigarettes    Start date: 08/09/1960  . Smokeless tobacco: Never Used  . Alcohol use No  . Drug use: No  . Sexual activity: Not on file   Other Topics Concern  . Not on file   Social History Narrative  . No narrative on file     Review of Systems: General: negative for chills, fever, night sweats or weight changes.  Cardiovascular: negative for chest pain, dyspnea on exertion, edema, orthopnea, palpitations, paroxysmal nocturnal dyspnea or shortness of breath Dermatological: negative for rash Respiratory: negative for cough or wheezing Urologic: negative for hematuria Abdominal: negative for nausea, vomiting, diarrhea, bright red blood per rectum, melena, or  hematemesis Neurologic: negative for visual changes, syncope, or dizziness All other systems reviewed and are otherwise negative except as noted above.    Blood pressure (!) 152/88, pulse 64, height 5\' 2"  (1.575 m), weight 208 lb 12.8 oz (94.7 kg).  General appearance: alert and no distress Neck: no adenopathy, no carotid bruit, no JVD, supple, symmetrical, trachea midline and thyroid not enlarged, symmetric, no tenderness/mass/nodules Lungs: clear to auscultation bilaterally Heart: irregularly irregular rhythm Extremities: extremities normal, atraumatic, no cyanosis or edema  EKG atrial fibrillation with a ventricular response of 64, left axis deviation and nonspecific ST and T-wave changes with evidence of LVH. I personally reviewed this EKG.  ASSESSMENT AND PLAN:   HYPERTENSION, BENIGN History of hypertension with blood pressure measured today 152/88. She is on metoprolol. Continue current meds at current dosing.  Carotid stenosis History of carotid artery disease Dopplers performed 07/29/16 revealing only mild left ICA stenosis. This will be repeated as needed.  Hyperlipidemia History of hyperlipidemia on statin therapy followed by her PCP  Acute systolic heart failure ([place]) Recent admission for systolic heart failure with ejection fraction in the 35-40% range. She was diuresed down to her dry weight and remains on oral diuretics. She is aware of some restriction. She does have mild dyspnea on exertion but is at her dry weight and has no peripheral edema.  PAF (paroxysmal atrial fibrillation) (HCC) Currently in persistent atrial fibrillation rate controlled on beta blocker and Eliquis oral anticoagulation.      Lorretta Harp MD FACP,FACC,FAHA, [facility] 11/25/2016 1:58 PM

## 2016-12-09 ENCOUNTER — Telehealth: Payer: Self-pay | Admitting: Cardiovascular Disease

## 2016-12-09 NOTE — Telephone Encounter (Signed)
New message   Pt c/o medication issue:  1. Name of Medication: potassium chloride SA (K-DUR,KLOR-CON) 20 MEQ tablet  Howw are you currently taking this medication (dosage and times per day)? 20 MG  3. Are you having a reaction (difficulty breathing--STAT)? NO  4. What is your medication issue? Pt states this is tearing her stomach up

## 2016-12-09 NOTE — Telephone Encounter (Signed)
Recommendation;  **Take potassium with meals of immediately after eating with full glass of water or liquids to avoid GI irritation.

## 2016-12-09 NOTE — Telephone Encounter (Signed)
Returned call to patient-she in concerned she is having side effects from her potassium.  Reports her "bowels are torn up" and she is having episodes of incontinece and is unaware until she goes to the restroom.  Reports intermittent stomach pains as well.  Symptoms have been occurring for >1 month.  Pt started potassium in the hospital in November 2017.  Denies any recent medication changes.  Denies other symptoms.  Pt reports she stopped taking her potassium 2 weeks ago and is still having symptoms and now is having leg pain/cramping.    Advised that I doubt it is coming from her potassium-as she stopped for 2 weeks with no resolution in symptoms.  Also advised her that I recommend taking her potassium as directed-explained to her why she is on this medication and possible effects if she does not take this.  Patient recommended I ask Dr. Gwenlyn Found his opinion.    Patient verbalized understanding and states she will restart her potassium today.    Routed to pharm and MD for review.  Pt requesting call back.

## 2016-12-09 NOTE — Telephone Encounter (Signed)
Returned call to patient and made aware of recommendations.  Verbalized understanding.

## 2016-12-18 ENCOUNTER — Telehealth: Payer: Self-pay | Admitting: Cardiovascular Disease

## 2016-12-18 NOTE — Telephone Encounter (Signed)
Left message to call back  will suggest to go  Urgent care or ER - STOP NOSE BLEED

## 2016-12-18 NOTE — Telephone Encounter (Signed)
Pt's daughter called to report her mother's nose has been bleeding for and hr. Now and needs a call back about this please.

## 2016-12-18 NOTE — Telephone Encounter (Signed)
LATE ENTRY, SPOKE TO DAUGHTER @ 11 AM Daughter states patient had nose bleed earlier, but it has stopped now , and issue last evening - saw spiders in her room.- ( daughter states the issue with spider she thinks mother saw pattern on curtains .  RN recommend - applied pressure to  Nose bleed. If not stop after hour or bleeding  profusely go to ER  - TRY TO PLACE MOISTURE IN AIR - USING humidifier  if symptoms like hallucination of need evaluation at ER   DAUGHTER VOICED UNDERSTANDING

## 2016-12-18 NOTE — Telephone Encounter (Signed)
Follow up    Pt daughter is returning call to sharon about her mother.

## 2016-12-24 ENCOUNTER — Other Ambulatory Visit: Payer: Self-pay | Admitting: Cardiovascular Disease

## 2016-12-25 NOTE — Telephone Encounter (Signed)
REFILL 

## 2017-02-14 NOTE — Progress Notes (Signed)
Cardiology Office Note    Date:  02/15/2017   ID:  Amy Hahn, DOB Dec 05, 1935, MRN [id number]  PCP:  [name], MD  Cardiologist: Dr. Gwenlyn Found    Chief Complaint  Patient presents with  . Follow-up    experiencing some dizziness    History of Present Illness:    Amy Hahn is a 81 y.o. female with past medical history of chronic combined systolic and diastolic CHF (EF of 31-49% by echo in 08/2016, PAF (on Eliquis), HTN, HLD, and carotid artery stenosis who presents to the office today for 27-month follow-up.   She was last examined by Dr. Gwenlyn Found in 11/2016 and reported doing well from a cardiac perspective at that time. Weight was stable at 208 lbs and she was continued on Lasix 40mg  daily.   In talking with the patient today, she reports having episodes of dizziness for the past several months. She notices this mostly when she is walking around the house. Not necessarily associated with positional changes. She does note weakness along her lower extremities bilaterally. She denies any recent falls. Was started on PRN Meclizine by her PCP but denies any improvement in her symptoms with this.   No recent chest discomfort, dyspnea on exertion, orthopnea, PND, or lower extremity edema. She reports good compliance with Lasix 40 mg daily.  She has been experiencing issues with hallucinations at nighttime, reporting seeing spiders along her walls. She was recently discontinued on Valium and started on Trazodone for sleep. She has continued to experience problems with insomnia despite being on Trazodone. She is scheduled for follow-up with her PCP in 2 weeks in regards to her worsening symptoms.    Past Medical History:  Diagnosis Date  . Anxiety   . Atrial fibrillation ([place])    a. paroxysmal, diagnosed in 08/2016 and started on Eliquis  . Breast cancer ([place])   . Cataracts, bilateral   . Chronic combined systolic and diastolic CHF (congestive heart failure) ([place])    a. echo 11/2015: EF  45-50%  b. echo 08/2016: EF 35-40%, diffuse HK, akinesis of mid inferoseptal and apical septal myocardium, mild AR, mild MR, LA moderately dilated.  . Complication of anesthesia   . GERD (gastroesophageal reflux disease)   . Glaucoma   . History of kidney stones    HISTORY OF  . Hyperlipidemia   . Hypertension   . Macular degeneration   . Nodule of right lung   . Obesity   . PONV (postoperative nausea and vomiting)   . Sleep apnea     Past Surgical History:  Procedure Laterality Date  . CATARACT EXTRACTION, BILATERAL    . CESAREAN SECTION    . MASTECTOMY Right 1997 ?  [name] TOTAL KNEE ARTHROPLASTY     x2    Current Medications: Outpatient Medications Prior to Visit  Medication Sig Dispense Refill  . acetaminophen (TYLENOL) 500 MG tablet Take 500 mg by mouth every 6 (six) hours as needed for moderate pain.     [name] apixaban (ELIQUIS) 5 MG TABS tablet Take 1 tablet (5 mg total) by mouth 2 (two) times daily. 60 tablet 6  . brimonidine (ALPHAGAN) 0.2 % ophthalmic solution Place 2 drops into both eyes daily.     . cetirizine (ZYRTEC) 10 MG tablet Take 10 mg by mouth daily.    . Cholecalciferol 5000 units TABS Take 5,000 Units by mouth every evening.    . diazepam (VALIUM) 5 MG tablet Take 2.5 mg by mouth every 6 (  six) hours as needed for anxiety or sedation.     . dorzolamide-timolol (COSOPT) 22.3-6.8 MG/ML ophthalmic solution Place 1 drop into both eyes 2 (two) times daily.     . furosemide (LASIX) 40 MG tablet Take 1 tablet (40 mg total) by mouth daily. 30 tablet 6  . KLOR-CON M20 20 MEQ tablet TAKE 2 TABLETS (40 MEQ TOTAL) BY MOUTH DAILY. 60 tablet 6  . latanoprost (XALATAN) 0.005 % ophthalmic solution Place 1 drop into both eyes at bedtime.    [name] levothyroxine (SYNTHROID, LEVOTHROID) 88 MCG tablet Take 88 mcg by mouth daily before breakfast.    . metoprolol tartrate (LOPRESSOR) 25 MG tablet Take 1 tablet by mouth twice a day and take 1 tablet daily AS NEEDED FOR PALPITATIONS 270 tablet 3   . Multiple Vitamin (MULTIVITAMIN WITH MINERALS) TABS tablet Take 1 tablet by mouth daily.    . Multiple Vitamins-Minerals (PRESERVISION AREDS 2) CAPS Take 1 capsule by mouth 2 (two) times daily.    . nitroGLYCERIN (NITROSTAT) 0.4 MG SL tablet Place 1 tablet (0.4 mg total) under the tongue every 5 (five) minutes as needed for chest pain. 25 tablet 6  . NON FORMULARY Inhale 1 application into the lungs at bedtime. CPAP machine     . omeprazole (PRILOSEC) 20 MG capsule Take 20 mg by mouth daily as needed (heartburn).     . prednisoLONE acetate (PRED FORTE) 1 % ophthalmic suspension Place 1 drop into the left eye 2 (two) times daily.    . rosuvastatin (CRESTOR) 20 MG tablet Take 20 mg by mouth every evening.    . traZODone (DESYREL) 50 MG tablet Take 25-50 mg by mouth at bedtime as needed for sleep.     No facility-administered medications prior to visit.      Allergies:   Amitriptyline; Edarbi [azilsartan]; Morphine and related; Amlodipine; Atenolol; Codeine; Doxazosin; Dyazide [hydrochlorothiazide w-triamterene]; Erythromycin; Labetalol; Levatol [penbutolol sulfate]; Losartan; Monopril [fosinopril]; Ace inhibitors; Ampicillin; Belviq [lorcaserin hcl]; Butrans [buprenorphine]; Clotrimazole-betamethasone; Elocon [mometasone furoate]; Latex; Penicillins; and Propranolol   Social History   Social History  . Marital status: Married    Spouse name: N/A  . Number of children: N/A  . Years of education: N/A   Social History Main Topics  . Smoking status: Former Smoker    Packs/day: 1.00    Types: Cigarettes    Start date: 08/09/1960  . Smokeless tobacco: Never Used  . Alcohol use No  . Drug use: No  . Sexual activity: Not Asked   Other Topics Concern  . None   Social History Narrative  . None     Family History:  The patient's family history includes Cancer in her father; Diabetes in her brother, brother, mother, and sister; Heart attack in her brother and father; Macular degeneration  in her mother.   Review of Systems:   Please see the history of present illness.     General:  No chills, fever, night sweats or weight changes.  Cardiovascular:  No chest pain, dyspnea on exertion, edema, orthopnea, palpitations, paroxysmal nocturnal dyspnea. Dermatological: No rash, lesions/masses Respiratory: No cough, dyspnea Urologic: No hematuria, dysuria Abdominal:   No nausea, vomiting, diarrhea, bright red blood per rectum, melena, or hematemesis Neurologic:  No changes in mental status. Positive for dizziness and hallucinations.  All other systems reviewed and are otherwise negative except as noted above.   Physical Exam:    VS:  BP 134/86   Pulse 68   Ht 5\' 2"  (1.575 m)  Wt 207 lb (93.9 kg)   BMI 37.86 kg/m    General: Well developed, well nourished Caucasian female appearing in no acute distress. Head: Normocephalic, atraumatic, sclera non-icteric, no xanthomas, nares are without discharge.  Neck: No carotid bruits. JVD not elevated.  Lungs: Respirations regular and unlabored, without wheezes or rales.  Heart: Irregularly irregular. No S3 or S4.  No murmur, no rubs, or gallops appreciated. Abdomen: Soft, non-tender, non-distended with normoactive bowel sounds. No hepatomegaly. No rebound/guarding. No obvious abdominal masses. Msk:  Strength and tone appear normal for age. No joint deformities or effusions. Extremities: No clubbing or cyanosis. No lower extremity edema.  Distal pedal pulses are 2+ bilaterally. Neuro: Alert and oriented X 3. Moves all extremities spontaneously. No focal deficits noted. Psych:  Responds to questions appropriately with a normal affect. Skin: No rashes or lesions noted  Wt Readings from Last 3 Encounters:  02/15/17 207 lb (93.9 kg)  11/25/16 208 lb 12.8 oz (94.7 kg)  09/30/16 208 lb 3.2 oz (94.4 kg)      Studies/Labs Reviewed:   EKG:  EKG is not ordered today.    Recent Labs: 06/30/2016: ALT 15; TSH 3.730 08/11/2016: B  Natriuretic Peptide 1,278.6 08/19/2016: Hemoglobin 11.6; Platelets 342 09/30/2016: BUN 22; Creat 1.32; Potassium 4.5; Sodium 142   Lipid Panel No results found for: CHOL, TRIG, HDL, CHOLHDL, VLDL, LDLCALC, LDLDIRECT  Additional studies/ records that were reviewed today include:   Echocardiogram: 08/2016 Study Conclusions  - Left ventricle: The cavity size was mildly dilated. There was   mild concentric hypertrophy. Systolic function was moderately   reduced. The estimated ejection fraction was in the range of 35%   to 40%. Diffuse hypokinesis. There is akinesis of the mid   inferoseptal and apical septal myocardium. Normal sinus rhythm   was absent. The study is not technically sufficient to allow   evaluation of LV diastolic function. - Aortic valve: There was mild regurgitation. - Mitral valve: There was mild regurgitation. - Left atrium: The atrium was moderately dilated.  Assessment:    1. Chronic combined systolic and diastolic CHF (congestive heart failure) ([place])   2. PAF (paroxysmal atrial fibrillation) ([place])   3. Essential hypertension   4. Dizziness   5. Weakness of both lower extremities   6. Medication management   7. Hallucinations      Plan:   In order of problems listed above:  1. Chronic Combined systolic and diastolic CHF - EF of 95-18% by echo in 08/2016.  - she does not appear volume overloaded by physical examination. Weight has been stable between 207 - 208 lbs. - continue Lasix 40mg  daily and Lopressor 25mg  BID. She is intolerant to Coreg, Toprol-XL, ARB's and ACE-I. Recheck BMET today to assess creatinine and K+ levels.   2. PAF - she denies any recent palpitations or dyspnea on exertion. No evidence of active bleeding.  - continue Lopressor for rate-control and Eliquis for anticoagulation.  3. HTN - BP initially reported at 192/96 which was likely inaccurate on the automated cuff as it was 134/86 on recheck with a similar value in her right  upper extremity. - continue Lopressor 25mg  BID.   4. Dizziness/ Lower Extremity Weakness - reports having episodes of dizziness for the past several months which mostly occurs when walking around the house. Not associated with positional changes. Has experienced weakness along her lower extremities bilaterally. No recent falls. - the patient has worked with PT in the past with improvement in her symptoms. Will  order for Home Health PT to reassess the patient as her dizziness does not seem consistent with BPPV, Meniere's Disease, or a cardiac etiology (no dyspnea, palpitations, or chest pain with her symptoms).    5. Hallucinations - recently weaned from Valium due to hallucinations. Reports still seeing "spiders on her walls at night". Since starting Trazodone, she has experienced slightly more frequent symptoms. Recommended she only take Trazodone as needed instead of daily.  - healthy sleep habits reviewed.  - recommended she follow-up with her PCP as she may need referral to Psychiatry if hallucinations continue.    Medication Adjustments/Labs and Tests Ordered: Current medicines are reviewed at length with the patient today.  Concerns regarding medicines are outlined above.  Medication changes, Labs and Tests ordered today are listed in the Patient Instructions below. Patient Instructions  Medication Instructions:  No change  Labwork:  BMET today  Testing/Procedures: None ordered  Follow-Up:  With Dr. Gwenlyn Found in 6 months.  Any Other Special Instructions Will Be Listed Below (If Applicable).  We have placed a referral for home health services (physical therapy). Someone will call you at home to set up these services. Please call us if you have not heard anything in the next few days.  If you need a refill on your cardiac medications before your next appointment, please call your pharmacy.   Signed, Erma Heritage, PA-C  02/15/2017 7:54 PM    [place], [place], [place]  [address] Phone: [phone]; Fax: [phone]  [address] [place], [place] [address] Phone: [phone]

## 2017-02-15 ENCOUNTER — Ambulatory Visit (INDEPENDENT_AMBULATORY_CARE_PROVIDER_SITE_OTHER): Payer: Medicare Other | Admitting: Student

## 2017-02-15 ENCOUNTER — Encounter: Payer: Self-pay | Admitting: Student

## 2017-02-15 VITALS — BP 134/86 | HR 68 | Ht 62.0 in | Wt 207.0 lb

## 2017-02-15 DIAGNOSIS — I10 Essential (primary) hypertension: Secondary | ICD-10-CM | POA: Diagnosis not present

## 2017-02-15 DIAGNOSIS — R443 Hallucinations, unspecified: Secondary | ICD-10-CM

## 2017-02-15 DIAGNOSIS — Z79899 Other long term (current) drug therapy: Secondary | ICD-10-CM | POA: Diagnosis not present

## 2017-02-15 DIAGNOSIS — R42 Dizziness and giddiness: Secondary | ICD-10-CM

## 2017-02-15 DIAGNOSIS — I5042 Chronic combined systolic (congestive) and diastolic (congestive) heart failure: Secondary | ICD-10-CM

## 2017-02-15 DIAGNOSIS — R29898 Other symptoms and signs involving the musculoskeletal system: Secondary | ICD-10-CM | POA: Diagnosis not present

## 2017-02-15 DIAGNOSIS — I480 Paroxysmal atrial fibrillation: Secondary | ICD-10-CM

## 2017-02-15 NOTE — Patient Instructions (Signed)
Medication Instructions:  No change  Labwork:  BMET today   Testing/Procedures: None ordered  Follow-Up:  With Dr. Gwenlyn Found in 6 months.  Any Other Special Instructions Will Be Listed Below (If Applicable).  We have placed a referral for home health services (physical therapy). Someone will call you at home to set up these services. Please call us if you have not heard anything in the next few days.    If you need a refill on your cardiac medications before your next appointment, please call your pharmacy.

## 2017-02-16 LAB — BASIC METABOLIC PANEL
BUN: 20 mg/dL (ref 7–25)
CHLORIDE: 102 mmol/L (ref 98–110)
CO2: 27 mmol/L (ref 20–31)
Calcium: 9.9 mg/dL (ref 8.6–10.4)
Creat: 1.38 mg/dL — ABNORMAL HIGH (ref 0.60–0.93)
GLUCOSE: 106 mg/dL — AB (ref 65–99)
POTASSIUM: 4 mmol/L (ref 3.5–5.3)
SODIUM: 140 mmol/L (ref 135–146)

## 2017-03-02 ENCOUNTER — Other Ambulatory Visit: Payer: Self-pay | Admitting: Cardiology

## 2017-03-08 ENCOUNTER — Other Ambulatory Visit: Payer: Self-pay | Admitting: Cardiology

## 2017-03-12 ENCOUNTER — Emergency Department (HOSPITAL_COMMUNITY): Payer: Medicare Other

## 2017-03-12 ENCOUNTER — Observation Stay (HOSPITAL_COMMUNITY)
Admission: EM | Admit: 2017-03-12 | Discharge: 2017-03-13 | Disposition: A | Discharge status: Home / Self Care | Payer: Medicare Other | Attending: Family Medicine | Admitting: Family Medicine

## 2017-03-12 ENCOUNTER — Encounter (HOSPITAL_COMMUNITY): Payer: Self-pay

## 2017-03-12 DIAGNOSIS — C50911 Malignant neoplasm of unspecified site of right female breast: Secondary | ICD-10-CM | POA: Diagnosis present

## 2017-03-12 DIAGNOSIS — G4733 Obstructive sleep apnea (adult) (pediatric): Secondary | ICD-10-CM | POA: Diagnosis not present

## 2017-03-12 DIAGNOSIS — R079 Chest pain, unspecified: Secondary | ICD-10-CM | POA: Diagnosis not present

## 2017-03-12 DIAGNOSIS — Z96659 Presence of unspecified artificial knee joint: Secondary | ICD-10-CM | POA: Insufficient documentation

## 2017-03-12 DIAGNOSIS — I110 Hypertensive heart disease with heart failure: Secondary | ICD-10-CM | POA: Diagnosis not present

## 2017-03-12 DIAGNOSIS — Z87891 Personal history of nicotine dependence: Secondary | ICD-10-CM | POA: Insufficient documentation

## 2017-03-12 DIAGNOSIS — E039 Hypothyroidism, unspecified: Secondary | ICD-10-CM | POA: Diagnosis present

## 2017-03-12 DIAGNOSIS — I480 Paroxysmal atrial fibrillation: Secondary | ICD-10-CM | POA: Insufficient documentation

## 2017-03-12 DIAGNOSIS — Z7901 Long term (current) use of anticoagulants: Secondary | ICD-10-CM | POA: Insufficient documentation

## 2017-03-12 DIAGNOSIS — E785 Hyperlipidemia, unspecified: Secondary | ICD-10-CM | POA: Diagnosis not present

## 2017-03-12 DIAGNOSIS — Z79899 Other long term (current) drug therapy: Secondary | ICD-10-CM | POA: Insufficient documentation

## 2017-03-12 DIAGNOSIS — I5042 Chronic combined systolic (congestive) and diastolic (congestive) heart failure: Secondary | ICD-10-CM | POA: Diagnosis not present

## 2017-03-12 LAB — BASIC METABOLIC PANEL
ANION GAP: 11 (ref 5–15)
BUN: 33 mg/dL — ABNORMAL HIGH (ref 6–20)
CALCIUM: 9.5 mg/dL (ref 8.9–10.3)
CO2: 24 mmol/L (ref 22–32)
Chloride: 102 mmol/L (ref 101–111)
Creatinine, Ser: 1.42 mg/dL — ABNORMAL HIGH (ref 0.44–1.00)
GFR, EST AFRICAN AMERICAN: 40 mL/min — AB (ref >60)
GFR, EST NON AFRICAN AMERICAN: 34 mL/min — AB (ref >60)
Glucose, Bld: 119 mg/dL — ABNORMAL HIGH (ref 65–99)
Potassium: 3.8 mmol/L (ref 3.5–5.1)
SODIUM: 137 mmol/L (ref 135–145)

## 2017-03-12 LAB — I-STAT TROPONIN, ED: TROPONIN I, POC: 0.02 ng/mL (ref 0.00–0.08)

## 2017-03-12 LAB — CBC
HCT: 36.3 % (ref 36.0–46.0)
Hemoglobin: 11.4 g/dL — ABNORMAL LOW (ref 12.0–15.0)
MCH: 31.8 pg (ref 26.0–34.0)
MCHC: 31.4 g/dL (ref 30.0–36.0)
MCV: 101.1 fL — ABNORMAL HIGH (ref 78.0–100.0)
PLATELETS: 265 10*3/uL (ref 150–400)
RBC: 3.59 MIL/uL — ABNORMAL LOW (ref 3.87–5.11)
RDW: 14.6 % (ref 11.5–15.5)
WBC: 6.3 10*3/uL (ref 4.0–10.5)

## 2017-03-12 LAB — PROTIME-INR
INR: 1.68
PROTHROMBIN TIME: 20 s — AB (ref 11.4–15.2)

## 2017-03-12 LAB — TROPONIN I
TROPONIN I: 0.03 ng/mL — AB (ref <0.03)
Troponin I: 0.03 ng/mL (ref <0.03)

## 2017-03-12 MED ORDER — TRAZODONE HCL 50 MG PO TABS
25.0000 mg | ORAL_TABLET | Freq: Every evening | ORAL | Status: DC | PRN
  Administered 2017-03-12: 25 mg via ORAL
  Filled 2017-03-12: qty 1

## 2017-03-12 MED ORDER — LEVOTHYROXINE SODIUM 88 MCG PO TABS
88.0000 ug | ORAL_TABLET | Freq: Every day | ORAL | Status: DC
  Administered 2017-03-13: 88 ug via ORAL
  Filled 2017-03-12: qty 1

## 2017-03-12 MED ORDER — LORATADINE 10 MG PO TABS
10.0000 mg | ORAL_TABLET | Freq: Every day | ORAL | Status: DC
  Administered 2017-03-13: 10 mg via ORAL
  Filled 2017-03-12: qty 1

## 2017-03-12 MED ORDER — METOPROLOL TARTRATE 25 MG PO TABS
25.0000 mg | ORAL_TABLET | Freq: Two times a day (BID) | ORAL | Status: DC
  Administered 2017-03-12 – 2017-03-13 (×2): 25 mg via ORAL
  Filled 2017-03-12 (×2): qty 1

## 2017-03-12 MED ORDER — NITROGLYCERIN 0.4 MG SL SUBL: 0.4000 mg | SUBLINGUAL_TABLET | SUBLINGUAL | Status: DC | PRN

## 2017-03-12 MED ORDER — ROSUVASTATIN CALCIUM 10 MG PO TABS: 20.0000 mg | ORAL_TABLET | Freq: Every evening | ORAL | Status: DC

## 2017-03-12 MED ORDER — PREDNISOLONE ACETATE 1 % OP SUSP
1.0000 [drp] | Freq: Two times a day (BID) | OPHTHALMIC | Status: DC
  Administered 2017-03-12 – 2017-03-13 (×2): 1 [drp] via OPHTHALMIC
  Filled 2017-03-12: qty 1

## 2017-03-12 MED ORDER — LATANOPROST 0.005 % OP SOLN
1.0000 [drp] | Freq: Every day | OPHTHALMIC | Status: DC
  Administered 2017-03-12: 1 [drp] via OPHTHALMIC
  Filled 2017-03-12: qty 2.5

## 2017-03-12 MED ORDER — APIXABAN 5 MG PO TABS
5.0000 mg | ORAL_TABLET | Freq: Two times a day (BID) | ORAL | Status: DC
  Administered 2017-03-12 – 2017-03-13 (×2): 5 mg via ORAL
  Filled 2017-03-12 (×2): qty 1

## 2017-03-12 MED ORDER — DIAZEPAM 5 MG PO TABS: 2.5000 mg | ORAL_TABLET | Freq: Four times a day (QID) | ORAL | Status: DC | PRN

## 2017-03-12 MED ORDER — DORZOLAMIDE HCL-TIMOLOL MAL 2-0.5 % OP SOLN
1.0000 [drp] | Freq: Two times a day (BID) | OPHTHALMIC | Status: DC
  Administered 2017-03-12 – 2017-03-13 (×2): 1 [drp] via OPHTHALMIC
  Filled 2017-03-12: qty 10

## 2017-03-12 MED ORDER — SODIUM CHLORIDE 0.9 % IV SOLN: INTRAVENOUS | Status: DC

## 2017-03-12 MED ORDER — CHOLECALCIFEROL 125 MCG (5000 UT) PO TABS
5000.0000 [IU] | ORAL_TABLET | Freq: Every evening | ORAL | Status: DC
  Administered 2017-03-12: 5000 [IU] via ORAL
  Filled 2017-03-12 (×2): qty 1

## 2017-03-12 MED ORDER — BRIMONIDINE TARTRATE 0.2 % OP SOLN
2.0000 [drp] | Freq: Every day | OPHTHALMIC | Status: DC
  Administered 2017-03-13: 2 [drp] via OPHTHALMIC
  Filled 2017-03-12: qty 5

## 2017-03-12 MED ORDER — ADULT MULTIVITAMIN W/MINERALS CH
1.0000 | ORAL_TABLET | Freq: Every day | ORAL | Status: DC
  Administered 2017-03-12 – 2017-03-13 (×2): 1 via ORAL
  Filled 2017-03-12: qty 1

## 2017-03-12 MED ORDER — ACETAMINOPHEN 325 MG PO TABS: 650.0000 mg | ORAL_TABLET | ORAL | Status: DC | PRN

## 2017-03-12 MED ORDER — PROSIGHT PO TABS
1.0000 | ORAL_TABLET | Freq: Two times a day (BID) | ORAL | Status: DC
  Administered 2017-03-12 – 2017-03-13 (×2): 1 via ORAL
  Filled 2017-03-12 (×2): qty 1

## 2017-03-12 MED ORDER — ONDANSETRON HCL 4 MG/2ML IJ SOLN: 4.0000 mg | Freq: Four times a day (QID) | INTRAMUSCULAR | Status: DC | PRN

## 2017-03-12 MED ORDER — OCUVITE-LUTEIN PO CAPS
1.0000 | ORAL_CAPSULE | Freq: Two times a day (BID) | ORAL | Status: DC
  Filled 2017-03-12: qty 1

## 2017-03-12 MED ORDER — ACETAMINOPHEN 500 MG PO TABS: 500.0000 mg | ORAL_TABLET | Freq: Four times a day (QID) | ORAL | Status: DC | PRN

## 2017-03-12 MED ORDER — MECLIZINE HCL 25 MG PO TABS: 25.0000 mg | ORAL_TABLET | Freq: Three times a day (TID) | ORAL | Status: DC | PRN

## 2017-03-12 MED ORDER — PANTOPRAZOLE SODIUM 40 MG PO TBEC
40.0000 mg | DELAYED_RELEASE_TABLET | Freq: Every day | ORAL | Status: DC
  Administered 2017-03-13: 40 mg via ORAL
  Filled 2017-03-12: qty 1

## 2017-03-12 NOTE — H&P (Signed)
History and Physical    Amy Hahn UYQ:[id number] DOB: [birth year] DOA: 03/12/2017  PCP: [name], MD  Patient coming from: Home  Chief Complaint: Chest pain  HPI: Amy Hahn is a 81 y.o. female with medical history significant of atrial fibrillation, chronic combined CHF and hypothyroidism came into the hospital complaining about chest pain. Patient reported she was in beauty shop doing her hair, she was under the dryer when she developed chest pain, was substernal, radiates to both sides, very intense 10/10 in severity and had severe shortness of breath with it. EMS was called, she felt better and she decided to go with her son, in her son's car on the way to the hospital she developed another bout of chest pain, they stopped the car and the call EMS and transported to the hospital via EMS.  ED Course:  Vitals: WNL Labs: WNL except creatinine elevated at 1.4 Imaging: CXR no acute findings Interventions: Given nitroglycerin and aspirin on route.  Review of Systems:  Constitutional: negative for anorexia, fevers and sweats Eyes: negative for irritation, redness and visual disturbance Ears, nose, mouth, throat, and face: negative for earaches, epistaxis, nasal congestion and sore throat Respiratory: negative for cough, dyspnea on exertion, sputum and wheezing Cardiovascular: Per HPI Gastrointestinal: negative for abdominal pain, constipation, diarrhea, melena, nausea and vomiting Genitourinary:negative for dysuria, frequency and hematuria Hematologic/lymphatic: negative for bleeding, easy bruising and lymphadenopathy Musculoskeletal:negative for arthralgias, muscle weakness and stiff joints Neurological: negative for coordination problems, gait problems, headaches and weakness Endocrine: negative for diabetic symptoms including polydipsia, polyuria and weight loss Allergic/Immunologic: negative for anaphylaxis, hay fever and urticaria  Past Medical History:  Diagnosis Date  .  Anxiety   . Atrial fibrillation ([place])    a. paroxysmal, diagnosed in 08/2016 and started on Eliquis  . Breast cancer ([place])   . Cataracts, bilateral   . Chronic combined systolic and diastolic CHF (congestive heart failure) ([place])    a. echo 11/2015: EF 45-50%  b. echo 08/2016: EF 35-40%, diffuse HK, akinesis of mid inferoseptal and apical septal myocardium, mild AR, mild MR, LA moderately dilated.  . Complication of anesthesia   . GERD (gastroesophageal reflux disease)   . Glaucoma   . History of kidney stones    HISTORY OF  . Hyperlipidemia   . Hypertension   . Macular degeneration   . Nodule of right lung   . Obesity   . PONV (postoperative nausea and vomiting)   . Sleep apnea     Past Surgical History:  Procedure Laterality Date  . CATARACT EXTRACTION, BILATERAL    . CESAREAN SECTION    . MASTECTOMY Right 1997 ?  [name] TOTAL KNEE ARTHROPLASTY     x2     reports that she has quit smoking. Her smoking use included Cigarettes. She started smoking about 56 years ago. She smoked 1.00 pack per day. She has never used smokeless tobacco. She reports that she does not drink alcohol or use drugs.  Allergies  Allergen Reactions  . Amitriptyline Other (See Comments)    Weakness   . Edarbi [Azilsartan] Other (See Comments)    Lupus  . Morphine And Related Other (See Comments)    "MAKES ME CRAZY"  . Amlodipine Other (See Comments)    Headache   . Atenolol Other (See Comments)    "decreases blood pressure"  . Codeine Nausea Only  . Doxazosin Other (See Comments)    Leg pain  . Dyazide [Hydrochlorothiazide W-Triamterene] Itching  . Erythromycin  Diarrhea and Other (See Comments)    Also cramping  . Labetalol Other (See Comments)    Bradycardia   . Levatol [Penbutolol Sulfate] Other (See Comments)    Eye swelling  . Losartan Other (See Comments)    headache  . Monopril [Fosinopril] Hives  . Ace Inhibitors Cough  . Ampicillin Rash  . Belviq [Lorcaserin Hcl] Itching  .  Butrans [Buprenorphine] Rash  . Clotrimazole-Betamethasone Rash  . Elocon [Mometasone Furoate] Rash  . Latex Rash    " IF ON ME FOR OVER 24 HOURS"  . Penicillins Other (See Comments)    "in large doses"  . Propranolol Itching    Family History  Problem Relation Age of Onset  . Diabetes Mother   . Macular degeneration Mother   . Heart attack Father   . Cancer Father        Colon and Prostate Cancer  . Diabetes Sister   . Diabetes Brother   . Heart attack Brother   . Diabetes Brother     Prior to Admission medications   Medication Sig Start Date End Date Taking? Authorizing Provider  acetaminophen (TYLENOL) 500 MG tablet Take 500 mg by mouth every 6 (six) hours as needed for moderate pain.    Yes [provider]  brimonidine (ALPHAGAN) 0.2 % ophthalmic solution Place 2 drops into both eyes daily.  03/15/15  Yes [provider]  cetirizine (ZYRTEC) 10 MG tablet Take 10 mg by mouth daily.   Yes [provider]  Cholecalciferol 5000 units TABS Take 5,000 Units by mouth every evening.   Yes [provider]  diazepam (VALIUM) 5 MG tablet Take 2.5 mg by mouth every 6 (six) hours as needed for anxiety or sedation.  03/24/12  Yes [provider]  dorzolamide-timolol (COSOPT) 22.3-6.8 MG/ML ophthalmic solution Place 1 drop into both eyes 2 (two) times daily.  04/12/12  Yes [provider]  ELIQUIS 5 MG TABS tablet TAKE 1 TABLET TWICE A DAY 03/09/17  Yes Lorretta Harp, MD  furosemide (LASIX) 40 MG tablet TAKE 1 TABLET (40 MG TOTAL) BY MOUTH DAILY. 03/09/17  Yes Lorretta Harp, MD  KLOR-CON M20 20 MEQ tablet TAKE 2 TABLETS (40 MEQ TOTAL) BY MOUTH DAILY. Patient taking differently: Take 20 mEq by mouth daily.  12/25/16  Yes Lorretta Harp, MD  latanoprost (XALATAN) 0.005 % ophthalmic solution Place 1 drop into both eyes at bedtime.   Yes [provider]  levothyroxine (SYNTHROID, LEVOTHROID) 88 MCG tablet Take 88 mcg by mouth daily  before breakfast.   Yes [provider]  meclizine (ANTIVERT) 25 MG tablet Take 25 mg by mouth 3 (three) times daily as needed for dizziness.   Yes [provider]  metoprolol tartrate (LOPRESSOR) 25 MG tablet Take 1 tablet by mouth twice a day and take 1 tablet daily AS NEEDED FOR PALPITATIONS 08/21/16  Yes Strader, [place] M, PA-C  Multiple Vitamin (MULTIVITAMIN WITH MINERALS) TABS tablet Take 1 tablet by mouth daily.   Yes [provider]  Multiple Vitamins-Minerals (PRESERVISION AREDS 2) CAPS Take 1 capsule by mouth 2 (two) times daily.   Yes [provider]  NON FORMULARY Inhale 1 application into the lungs at bedtime. CPAP machine    Yes [provider]  omeprazole (PRILOSEC) 20 MG capsule Take 20 mg by mouth daily as needed (heartburn).  03/24/12  Yes [provider]  prednisoLONE acetate (PRED FORTE) 1 % ophthalmic suspension Place 1 drop into the left  eye 2 (two) times daily. 10/10/15  Yes [provider]  rosuvastatin (CRESTOR) 20 MG tablet Take 20 mg by mouth every evening.   Yes [provider]  traZODone (DESYREL) 50 MG tablet Take 25-50 mg by mouth at bedtime as needed for sleep.   Yes [provider]  metoprolol tartrate (LOPRESSOR) 25 MG tablet TAKE 1 TABLET (25 MG TOTAL) BY MOUTH 2 (TWO) TIMES DAILY. Patient not taking: Reported on 03/12/2017 03/03/17   Erma Heritage, PA-C  nitroGLYCERIN (NITROSTAT) 0.4 MG SL tablet Place 1 tablet (0.4 mg total) under the tongue every 5 (five) minutes as needed for chest pain. 11/25/16   Lorretta Harp, MD    Physical Exam:  Vitals:   03/12/17 1526 03/12/17 1529 03/12/17 1635 03/12/17 1702  BP: (!) 147/59  (!) 112/57 (!) 156/84  Pulse: (!) 59  61 (!) 45  Resp: 18  (!) 22 20  Temp: 98 F (36.7 C)     TempSrc: Oral     SpO2: 94%  100% 99%  Weight:  92.5 kg (204 lb)    Height:  5\' 1"  (1.549 m)      Constitutional: NAD, calm, comfortable Eyes: PERRL, lids  and conjunctivae normal ENMT: Mucous membranes are moist. Posterior pharynx clear of any exudate or lesions.Normal dentition.  Neck: normal, supple, no masses, no thyromegaly Respiratory: clear to auscultation bilaterally, no wheezing, no crackles. Normal respiratory effort. No accessory muscle use.  Cardiovascular: Regular rate and rhythm, no murmurs / rubs / gallops. No extremity edema. 2+ pedal pulses. No carotid bruits.  Abdomen: no tenderness, no masses palpated. No hepatosplenomegaly. Bowel sounds positive.  Musculoskeletal: no clubbing / cyanosis. No joint deformity upper and lower extremities. Good ROM, no contractures. Normal muscle tone.  Skin: no rashes, lesions, ulcers. No induration Neurologic: CN 2-12 grossly intact. Sensation intact, DTR normal. Strength 5/5 in all 4.  Psychiatric: Normal judgment and insight. Alert and oriented x 3. Normal mood.   Labs on Admission: I have personally reviewed following labs and imaging studies  CBC:  Recent Labs Lab 03/12/17 1530  WBC 6.3  HGB 11.4*  HCT 36.3  MCV 101.1*  PLT 144   Basic Metabolic Panel:  Recent Labs Lab 03/12/17 1530  NA 137  K 3.8  CL 102  CO2 24  GLUCOSE 119*  BUN 33*  CREATININE 1.42*  CALCIUM 9.5   GFR: Estimated Creatinine Clearance: 33.3 mL/min (A) (by C-G formula based on SCr of 1.42 mg/dL (H)). Liver Function Tests: No results for input(s): AST, ALT, ALKPHOS, BILITOT, PROT, ALBUMIN in the last 168 hours. No results for input(s): LIPASE, AMYLASE in the last 168 hours. No results for input(s): AMMONIA in the last 168 hours. Coagulation Profile:  Recent Labs Lab 03/12/17 1530  INR 1.68   Cardiac Enzymes: No results for input(s): CKTOTAL, CKMB, CKMBINDEX, TROPONINI in the last 168 hours. BNP (last 3 results) No results for input(s): PROBNP in the last 8760 hours. HbA1C: No results for input(s): HGBA1C in the last 72 hours. CBG: No results for input(s): GLUCAP in the last 168 hours. Lipid  Profile: No results for input(s): CHOL, HDL, LDLCALC, TRIG, CHOLHDL, LDLDIRECT in the last 72 hours. Thyroid Function Tests: No results for input(s): TSH, T4TOTAL, FREET4, T3FREE, THYROIDAB in the last 72 hours. Anemia Panel: No results for input(s): VITAMINB12, FOLATE, FERRITIN, TIBC, IRON, RETICCTPCT in the last 72 hours. Urine analysis:    Component Value Date/Time   COLORURINE YELLOW 06/30/2016 2330   APPEARANCEUR  CLEAR 06/30/2016 2330   LABSPEC 1.011 06/30/2016 2330   PHURINE 7.5 06/30/2016 2330   GLUCOSEU NEGATIVE 06/30/2016 2330   HGBUR NEGATIVE 06/30/2016 2330   BILIRUBINUR NEGATIVE 06/30/2016 2330   KETONESUR NEGATIVE 06/30/2016 2330   PROTEINUR NEGATIVE 06/30/2016 2330   NITRITE NEGATIVE 06/30/2016 2330   LEUKOCYTESUR SMALL (A) 06/30/2016 2330   Sepsis Labs: !!!!!!!!!!!!!!!!!!!!!!!!!!!!!!!!!!!!!!!!!!!! Invalid input(s): PROCALCITONIN, LACTICIDVEN No results found for this or any previous visit (from the past 240 hour(s)).   Radiological Exams on Admission: Dg Chest 2 View  Result Date: 03/12/2017 CLINICAL DATA:  Chest pain. EXAM: CHEST  2 VIEW COMPARISON:  08/11/2016 .  CT 05/26/2016.  CT 05/21/2015 . FINDINGS: Stable cardiomegaly. No pulmonary venous congestion. No focal infiltrate. Stable nodule right mid lung. No new pulmonary nodules are identified. No pleural effusion or pneumothorax. Right mastectomy. Thoracic spine scoliosis . IMPRESSION: 1. Stable cardiomegaly.  No pulmonary venous congestion. 2. Stable 9 mm pulmonary nodule right mid lung. 3. Right mastectomy. Electronically Signed   By: Marcello Moores  Register   On: 03/12/2017 15:55    EKG: Independently reviewed.   Assessment/Plan Principal Problem:   Chest pain Active Problems:   Hypothyroidism   Breast cancer, right breast (HCC)   Chronic combined systolic and diastolic CHF (congestive heart failure) (HCC)   PAF (paroxysmal atrial fibrillation) (HCC)    Chest pain -Heart scores 5, typical chest pain  relieved by nitroglycerin. -Rule out ACS by 3 sets of cardiac enzymes, repeat EKG in the morning. -Low risk stress test in December 2016. -If everything looks okay in the morning she might be better to go and follow-up as outpatient with Dr. Gwenlyn Found. -If she developed chest pain again, consider call cardiology in a.m.  Paroxysmal atrial fibrillation -Is on Eliquis, continued, heart rate is controlled.  Chronic combined systolic CHF -No signs of decompensation, patient reported having diarrhea not eating anything. -She felt dehydrated, held Lasix and start gentle IV fluid hydration for 24 hours, normal saline at 75 mL/hour.  Breast cancer -History of breast cancer status post resection, follow-up as outpatient.  Hypothyroidism -Continue Synthroid.   DVT prophylaxis: SQ Heparin Code Status: Full code Family Communication: Plan D/W patient Disposition Plan: Home Consults called:  Admission status: Observation   Dyan Labarbera A MD [facility] Pager [phone]  If 7PM-7AM, please contact night-coverage [email or web address] Password [facility]  03/12/2017, 5:34 PM

## 2017-03-12 NOTE — ED Triage Notes (Signed)
Pt brought in by EMS for chest pain around 12. Pt received nitrox1 and 325mg  of asprin. Pt is on eliquis. Pt a&ox4.

## 2017-03-12 NOTE — ED Provider Notes (Signed)
[facility] DEPT Provider Note   CSN: [id number] Arrival date & time: 03/12/17  1501     History   Chief Complaint Chief Complaint  Patient presents with  . Chest Pain    HPI Amy Hahn is a 81 y.o. female.  HPI   Pt with hx paroxysmal afib on eliquis, HTN, HLD, CHF (EF 35-40%), breast CA, lung nodule p/w central chest pressure that radiated in a "splatter" out from her central chest, then through to her back.  Associated SOB, sweating, nausea.  This occurred while she was under the hairdryer at the hairdresser.  Lasted 6-8 minutes.  She was given chewable aspirin.  She drove home with her son and had a recurrence that lasted 4 minutes.  Denies fevers, recent cough/cold symptoms, leg swelling, abdominal pain.  Past Medical History:  Diagnosis Date  . Anxiety   . Atrial fibrillation ([place])    a. paroxysmal, diagnosed in 08/2016 and started on Eliquis  . Breast cancer ([place])   . Cataracts, bilateral   . Chronic combined systolic and diastolic CHF (congestive heart failure) ([place])    a. echo 11/2015: EF 45-50%  b. echo 08/2016: EF 35-40%, diffuse HK, akinesis of mid inferoseptal and apical septal myocardium, mild AR, mild MR, LA moderately dilated.  . Complication of anesthesia   . GERD (gastroesophageal reflux disease)   . Glaucoma   . History of kidney stones    HISTORY OF  . Hyperlipidemia   . Hypertension   . Macular degeneration   . Nodule of right lung   . Obesity   . PONV (postoperative nausea and vomiting)   . Sleep apnea     Patient Active Problem List   Diagnosis Date Noted  . Chronic combined systolic and diastolic CHF (congestive heart failure) ([place]) 09/30/2016  . PAF (paroxysmal atrial fibrillation) ([place]) 09/30/2016  . Essential hypertension 09/30/2016  . Acute systolic heart failure ([place]) 08/13/2016  . Gait abnormality 07/01/2016  . OSA on CPAP 07/01/2016  . Slurred speech 07/01/2016  . Hyperlipidemia 09/23/2015  . Lung nodule, solitary 07/12/2012    . Breast cancer, right breast ([place]) 07/11/2012  . Carotid stenosis 06/19/2012  . Bradycardia 06/02/2012  . Dizziness 04/27/2012  . Chest pain 04/27/2012  . Palpitations 04/27/2012  . Hypothyroidism 10/17/2008  . HYPERTENSION, BENIGN 10/17/2008    Past Surgical History:  Procedure Laterality Date  . CATARACT EXTRACTION, BILATERAL    . CESAREAN SECTION    . MASTECTOMY Right 1997 ?  [name] TOTAL KNEE ARTHROPLASTY     x2    OB History    No data available       Home Medications    Prior to Admission medications   Medication Sig Start Date End Date Taking? Authorizing Provider  acetaminophen (TYLENOL) 500 MG tablet Take 500 mg by mouth every 6 (six) hours as needed for moderate pain.    Yes [provider]  brimonidine (ALPHAGAN) 0.2 % ophthalmic solution Place 2 drops into both eyes daily.  03/15/15  Yes [provider]  cetirizine (ZYRTEC) 10 MG tablet Take 10 mg by mouth daily.   Yes [provider]  Cholecalciferol 5000 units TABS Take 5,000 Units by mouth every evening.   Yes [provider]  diazepam (VALIUM) 5 MG tablet Take 2.5 mg by mouth every 6 (six) hours as needed for anxiety or sedation.  03/24/12  Yes [provider]  dorzolamide-timolol (COSOPT) 22.3-6.8 MG/ML ophthalmic solution Place 1 drop into both eyes 2 (two)  times daily.  04/12/12  Yes [provider]  ELIQUIS 5 MG TABS tablet TAKE 1 TABLET TWICE A DAY 03/09/17  Yes Lorretta Harp, MD  furosemide (LASIX) 40 MG tablet TAKE 1 TABLET (40 MG TOTAL) BY MOUTH DAILY. 03/09/17  Yes Lorretta Harp, MD  KLOR-CON M20 20 MEQ tablet TAKE 2 TABLETS (40 MEQ TOTAL) BY MOUTH DAILY. Patient taking differently: Take 20 mEq by mouth daily.  12/25/16  Yes Lorretta Harp, MD  latanoprost (XALATAN) 0.005 % ophthalmic solution Place 1 drop into both eyes at bedtime.   Yes [provider]  levothyroxine (SYNTHROID, LEVOTHROID) 88 MCG tablet Take 88 mcg by mouth daily before  breakfast.   Yes [provider]  meclizine (ANTIVERT) 25 MG tablet Take 25 mg by mouth 3 (three) times daily as needed for dizziness.   Yes [provider]  metoprolol tartrate (LOPRESSOR) 25 MG tablet Take 1 tablet by mouth twice a day and take 1 tablet daily AS NEEDED FOR PALPITATIONS 08/21/16  Yes Strader, [place] M, PA-C  Multiple Vitamin (MULTIVITAMIN WITH MINERALS) TABS tablet Take 1 tablet by mouth daily.   Yes [provider]  Multiple Vitamins-Minerals (PRESERVISION AREDS 2) CAPS Take 1 capsule by mouth 2 (two) times daily.   Yes [provider]  NON FORMULARY Inhale 1 application into the lungs at bedtime. CPAP machine    Yes [provider]  omeprazole (PRILOSEC) 20 MG capsule Take 20 mg by mouth daily as needed (heartburn).  03/24/12  Yes [provider]  prednisoLONE acetate (PRED FORTE) 1 % ophthalmic suspension Place 1 drop into the left eye 2 (two) times daily. 10/10/15  Yes [provider]  rosuvastatin (CRESTOR) 20 MG tablet Take 20 mg by mouth every evening.   Yes [provider]  traZODone (DESYREL) 50 MG tablet Take 25-50 mg by mouth at bedtime as needed for sleep.   Yes [provider]  metoprolol tartrate (LOPRESSOR) 25 MG tablet TAKE 1 TABLET (25 MG TOTAL) BY MOUTH 2 (TWO) TIMES DAILY. Patient not taking: Reported on 03/12/2017 03/03/17   Erma Heritage, PA-C  nitroGLYCERIN (NITROSTAT) 0.4 MG SL tablet Place 1 tablet (0.4 mg total) under the tongue every 5 (five) minutes as needed for chest pain. 11/25/16   Lorretta Harp, MD    Family History Family History  Problem Relation Age of Onset  . Diabetes Mother   . Macular degeneration Mother   . Heart attack Father   . Cancer Father        Colon and Prostate Cancer  . Diabetes Sister   . Diabetes Brother   . Heart attack Brother   . Diabetes Brother     Social History Social History  Substance Use Topics  . Smoking status: Former  Smoker    Packs/day: 1.00    Types: Cigarettes    Start date: 08/09/1960  . Smokeless tobacco: Never Used  . Alcohol use No     Allergies   Amitriptyline; Edarbi [azilsartan]; Morphine and related; Amlodipine; Atenolol; Codeine; Doxazosin; Dyazide [hydrochlorothiazide w-triamterene]; Erythromycin; Labetalol; Levatol [penbutolol sulfate]; Losartan; Monopril [fosinopril]; Ace inhibitors; Ampicillin; Belviq [lorcaserin hcl]; Butrans [buprenorphine]; Clotrimazole-betamethasone; Elocon [mometasone furoate]; Latex; Penicillins; and Propranolol   Review of Systems Review of Systems  All other systems reviewed and are negative.    Physical Exam Updated Vital Signs BP (!) 156/84   Pulse (!) 45   Temp 98 F (36.7 C) (Oral)   Resp 20   Ht 5'  1" (1.549 m)   Wt 92.5 kg (204 lb)   SpO2 99%   BMI 38.55 kg/m   Physical Exam  Constitutional: She appears well-developed and well-nourished. No distress.  HENT:  Head: Normocephalic and atraumatic.  Neck: Neck supple.  Cardiovascular: Normal rate and regular rhythm.   Pulmonary/Chest: Effort normal and breath sounds normal. No respiratory distress. She has no wheezes. She has no rales.  Abdominal: Soft. She exhibits no distension. There is no tenderness. There is no rebound and no guarding.  Neurological: She is alert.  Skin: She is not diaphoretic.  Nursing note and vitals reviewed.    ED Treatments / Results  Labs (all labs ordered are listed, but only abnormal results are displayed) Labs Reviewed  BASIC METABOLIC PANEL - Abnormal; Notable for the following:       Result Value   Glucose, Bld 119 (*)    BUN 33 (*)    Creatinine, Ser 1.42 (*)    GFR calc non Af Amer 34 (*)    GFR calc Af Amer 40 (*)    All other components within normal limits  CBC - Abnormal; Notable for the following:    RBC 3.59 (*)    Hemoglobin 11.4 (*)    MCV 101.1 (*)    All other components within normal limits  PROTIME-INR - Abnormal; Notable for the  following:    Prothrombin Time 20.0 (*)    All other components within normal limits  I-STAT TROPOININ, ED    EKG  EKG Interpretation  Date/Time:  Friday March 12 2017 15:19:29 EDT Ventricular Rate:  57 PR Interval:    QRS Duration: 95 QT Interval:  449 QTC Calculation: 438 R Axis:   -21 Text Interpretation:  Atrial fibrillation Abnormal R-wave progression, late transition Left ventricular hypertrophy Confirmed by Hazle Coca [phone]) on 03/12/2017 3:24:58 PM       Radiology Dg Chest 2 View  Result Date: 03/12/2017 CLINICAL DATA:  Chest pain. EXAM: CHEST  2 VIEW COMPARISON:  08/11/2016 .  CT 05/26/2016.  CT 05/21/2015 . FINDINGS: Stable cardiomegaly. No pulmonary venous congestion. No focal infiltrate. Stable nodule right mid lung. No new pulmonary nodules are identified. No pleural effusion or pneumothorax. Right mastectomy. Thoracic spine scoliosis . IMPRESSION: 1. Stable cardiomegaly.  No pulmonary venous congestion. 2. Stable 9 mm pulmonary nodule right mid lung. 3. Right mastectomy. Electronically Signed   By: Marcello Moores  Register   On: 03/12/2017 15:55    Procedures Procedures (including critical care time)  Medications Ordered in ED Medications - No data to display   Initial Impression / Assessment and Plan / ED Course  I have reviewed the triage vital signs and the nursing notes.  Pertinent labs & imaging results that were available during my care of the patient were reviewed by me and considered in my medical decision making (see chart for details).  Clinical Course as of Mar 12 1732  Fri Mar 12, 2017  1646 Pt to be admitted to [facility], Dr [place] Poli accepting.    [EW]    Clinical Course User Index [EW] Clayton Bibles, [place]    Afebrile nontoxic patient with HLD, HTN, PAF, CHF, obesity, strong family hx CAD p/w central chest pressure with SOB, lightheadedness, nausea.  EKG demonstrating rate controlled afib.  Initial troponin is negative.  Pt has had low risk myoview in  January but has never had a cath that I can find.  HEART score is 5.  Admitted to [facility] for chest  pain r/o ACS.    Final Clinical Impressions(s) / ED Diagnoses   Final diagnoses:  Chest pain, unspecified type    New Prescriptions New Prescriptions   No medications on file     Clayton Bibles, Hershal Coria 03/12/17 [place], MD 03/18/17 [phone]

## 2017-03-12 NOTE — ED Notes (Signed)
Report attempted x 1

## 2017-03-12 NOTE — ED Notes (Signed)
Patient transported to X-ray 

## 2017-03-13 DIAGNOSIS — I110 Hypertensive heart disease with heart failure: Secondary | ICD-10-CM | POA: Diagnosis not present

## 2017-03-13 DIAGNOSIS — R079 Chest pain, unspecified: Secondary | ICD-10-CM

## 2017-03-13 DIAGNOSIS — I5042 Chronic combined systolic (congestive) and diastolic (congestive) heart failure: Secondary | ICD-10-CM | POA: Diagnosis not present

## 2017-03-13 DIAGNOSIS — G4733 Obstructive sleep apnea (adult) (pediatric): Secondary | ICD-10-CM | POA: Diagnosis not present

## 2017-03-13 LAB — TROPONIN I: Troponin I: 0.03 ng/mL (ref <0.03)

## 2017-03-13 MED ORDER — VITAMIN D 1000 UNITS PO TABS: 5000.0000 [IU] | ORAL_TABLET | Freq: Every day | ORAL | Status: DC

## 2017-03-13 NOTE — Discharge Summary (Addendum)
Physician Discharge Summary  KARNE OZGA TGG:[id number] DOB: 06/17/1936 DOA: 03/12/2017  PCP: [name], MD  Admit date: 03/12/2017 Discharge date: 03/13/2017  Time spent: > 35 minutes  Recommendations for Outpatient Follow-up:  1. Ensure patient follows up with cardiologist 2. Continue monitor blood pressures and adjust blood pressure medication accordingly 3. Addendum: on chest x ray patient has stable 75mm lung nodule. Continue to monitor.   Discharge Diagnoses:  Principal Problem:   Chest pain Active Problems:   Hypothyroidism   Breast cancer, right breast (HCC)   Chronic combined systolic and diastolic CHF (congestive heart failure) (HCC)   PAF (paroxysmal atrial fibrillation) ([place])   Discharge Condition: stable  Diet recommendation: Heart healthy  Filed Weights   03/12/17 1529  Weight: 92.5 kg (204 lb)    History of present illness:  81 y.o. female with medical history significant of atrial fibrillation, chronic combined CHF and hypothyroidism came into the hospital complaining about chest pain  Hospital Course:  Chest pain - Patient has had stable troponin 0.03. EKG with artifact but no ST elevation - The patient reports no chest discomfort and states that after nitroglycerin it has completely gone away. I have offered inpatient cardiology consult to patient but she would rather follow-up with her cardiologist as outpatient which I think is reasonable. The patient reports ambulating without any reports of chest pain  Brother known medical problems listed above will continue home medication regimen listed below   Procedures:  None  Consultations:  None  Discharge Exam: Vitals:   03/12/17 2118 03/13/17 0605  BP: 137/70 (!) 175/81  Pulse: 70 71  Resp: 18 20  Temp: 98.3 F (36.8 C) 97.7 F (36.5 C)    General: Pt in nad, alert and awake Cardiovascular: IRRR, no rubs Respiratory: no increased wob, no wheezes  Discharge Instructions   Discharge  Instructions    Call MD for:  redness, tenderness, or signs of infection (pain, swelling, redness, odor or green/yellow discharge around incision site)    Complete by:  As directed    Call MD for:  severe uncontrolled pain    Complete by:  As directed    Call MD for:  temperature >100.4    Complete by:  As directed    Diet - low sodium heart healthy    Complete by:  As directed    Increase activity slowly    Complete by:  As directed      Current Discharge Medication List    CONTINUE these medications which have NOT CHANGED   Details  acetaminophen (TYLENOL) 500 MG tablet Take 500 mg by mouth every 6 (six) hours as needed for moderate pain.     brimonidine (ALPHAGAN) 0.2 % ophthalmic solution Place 2 drops into both eyes daily.     cetirizine (ZYRTEC) 10 MG tablet Take 10 mg by mouth daily.    Cholecalciferol 5000 units TABS Take 5,000 Units by mouth every evening.    diazepam (VALIUM) 5 MG tablet Take 2.5 mg by mouth every 6 (six) hours as needed for anxiety or sedation.     dorzolamide-timolol (COSOPT) 22.3-6.8 MG/ML ophthalmic solution Place 1 drop into both eyes 2 (two) times daily.     ELIQUIS 5 MG TABS tablet TAKE 1 TABLET TWICE A DAY Qty: 180 tablet, Refills: 1    furosemide (LASIX) 40 MG tablet TAKE 1 TABLET (40 MG TOTAL) BY MOUTH DAILY. Qty: 30 tablet, Refills: 1    KLOR-CON M20 20 MEQ tablet TAKE 2 TABLETS (  40 MEQ TOTAL) BY MOUTH DAILY. Qty: 60 tablet, Refills: 6    latanoprost (XALATAN) 0.005 % ophthalmic solution Place 1 drop into both eyes at bedtime.    levothyroxine (SYNTHROID, LEVOTHROID) 88 MCG tablet Take 88 mcg by mouth daily before breakfast.    meclizine (ANTIVERT) 25 MG tablet Take 25 mg by mouth 3 (three) times daily as needed for dizziness.    !! metoprolol tartrate (LOPRESSOR) 25 MG tablet Take 1 tablet by mouth twice a day and take 1 tablet daily AS NEEDED FOR PALPITATIONS Qty: 270 tablet, Refills: 3    Multiple Vitamin (MULTIVITAMIN WITH  MINERALS) TABS tablet Take 1 tablet by mouth daily.    Multiple Vitamins-Minerals (PRESERVISION AREDS 2) CAPS Take 1 capsule by mouth 2 (two) times daily.    NON FORMULARY Inhale 1 application into the lungs at bedtime. CPAP machine     omeprazole (PRILOSEC) 20 MG capsule Take 20 mg by mouth daily as needed (heartburn).     prednisoLONE acetate (PRED FORTE) 1 % ophthalmic suspension Place 1 drop into the left eye 2 (two) times daily.    rosuvastatin (CRESTOR) 20 MG tablet Take 20 mg by mouth every evening.    traZODone (DESYREL) 50 MG tablet Take 25-50 mg by mouth at bedtime as needed for sleep.    !! metoprolol tartrate (LOPRESSOR) 25 MG tablet TAKE 1 TABLET (25 MG TOTAL) BY MOUTH 2 (TWO) TIMES DAILY. Qty: 60 tablet, Refills: 6    nitroGLYCERIN (NITROSTAT) 0.4 MG SL tablet Place 1 tablet (0.4 mg total) under the tongue every 5 (five) minutes as needed for chest pain. Qty: 25 tablet, Refills: 6     !! - Potential duplicate medications found. Please discuss with provider.     Allergies  Allergen Reactions  . Amitriptyline Other (See Comments)    Weakness   . Edarbi [Azilsartan] Other (See Comments)    Lupus  . Morphine And Related Other (See Comments)    "MAKES ME CRAZY"  . Amlodipine Other (See Comments)    Headache   . Atenolol Other (See Comments)    "decreases blood pressure"  . Codeine Nausea Only  . Doxazosin Other (See Comments)    Leg pain  . Dyazide [Hydrochlorothiazide W-Triamterene] Itching  . Erythromycin Diarrhea and Other (See Comments)    Also cramping  . Labetalol Other (See Comments)    Bradycardia   . Levatol [Penbutolol Sulfate] Other (See Comments)    Eye swelling  . Losartan Other (See Comments)    headache  . Monopril [Fosinopril] Hives  . Ace Inhibitors Cough  . Ampicillin Rash  . Belviq [Lorcaserin Hcl] Itching  . Butrans [Buprenorphine] Rash  . Clotrimazole-Betamethasone Rash  . Elocon [Mometasone Furoate] Rash  . Latex Rash    " IF ON  ME FOR OVER 24 HOURS"  . Penicillins Other (See Comments)    "in large doses"  . Propranolol Itching      The results of significant diagnostics from this hospitalization (including imaging, microbiology, ancillary and laboratory) are listed below for reference.    Significant Diagnostic Studies: Dg Chest 2 View  Result Date: 03/12/2017 CLINICAL DATA:  Chest pain. EXAM: CHEST  2 VIEW COMPARISON:  08/11/2016 .  CT 05/26/2016.  CT 05/21/2015 . FINDINGS: Stable cardiomegaly. No pulmonary venous congestion. No focal infiltrate. Stable nodule right mid lung. No new pulmonary nodules are identified. No pleural effusion or pneumothorax. Right mastectomy. Thoracic spine scoliosis . IMPRESSION: 1. Stable cardiomegaly.  No pulmonary venous congestion. 2.  Stable 9 mm pulmonary nodule right mid lung. 3. Right mastectomy. Electronically Signed   By: Marcello Moores  Register   On: 03/12/2017 15:55    Microbiology: No results found for this or any previous visit (from the past 240 hour(s)).   Labs: Basic Metabolic Panel:  Recent Labs Lab 03/12/17 1530  NA 137  K 3.8  CL 102  CO2 24  GLUCOSE 119*  BUN 33*  CREATININE 1.42*  CALCIUM 9.5   Liver Function Tests: No results for input(s): AST, ALT, ALKPHOS, BILITOT, PROT, ALBUMIN in the last 168 hours. No results for input(s): LIPASE, AMYLASE in the last 168 hours. No results for input(s): AMMONIA in the last 168 hours. CBC:  Recent Labs Lab 03/12/17 1530  WBC 6.3  HGB 11.4*  HCT 36.3  MCV 101.1*  PLT 265   Cardiac Enzymes:  Recent Labs Lab 03/12/17 1856 03/12/17 2128 03/13/17 0038  TROPONINI 0.03* 0.03* 0.03*   BNP: BNP (last 3 results)  Recent Labs  08/11/16 0441  BNP 1,278.6*    ProBNP (last 3 results) No results for input(s): PROBNP in the last 8760 hours.  CBG: No results for input(s): GLUCAP in the last 168 hours.   Signed:  Velvet Bathe MD.  [facility] 03/13/2017, 11:58 AM

## 2017-03-13 NOTE — Progress Notes (Signed)
Troponin 0.03 MD notified no NO

## 2017-04-02 ENCOUNTER — Encounter: Payer: Self-pay | Admitting: Cardiovascular Disease

## 2017-04-02 ENCOUNTER — Other Ambulatory Visit: Payer: Self-pay | Admitting: *Deleted

## 2017-04-02 ENCOUNTER — Ambulatory Visit (INDEPENDENT_AMBULATORY_CARE_PROVIDER_SITE_OTHER): Payer: Medicare Other | Admitting: Cardiovascular Disease

## 2017-04-02 DIAGNOSIS — E7800 Pure hypercholesterolemia, unspecified: Secondary | ICD-10-CM

## 2017-04-02 DIAGNOSIS — I480 Paroxysmal atrial fibrillation: Secondary | ICD-10-CM

## 2017-04-02 MED ORDER — AMLODIPINE BESYLATE 5 MG PO TABS: 5.0000 mg | ORAL_TABLET | Freq: Every day | ORAL | 1 refills | Status: DC

## 2017-04-02 MED ORDER — ISOSORBIDE MONONITRATE ER 30 MG PO TB24: 15.0000 mg | ORAL_TABLET | Freq: Every day | ORAL | 1 refills | Status: DC

## 2017-04-02 MED ORDER — POTASSIUM CHLORIDE CRYS ER 20 MEQ PO TBCR: 40.0000 meq | EXTENDED_RELEASE_TABLET | Freq: Every day | ORAL | 0 refills | Status: DC

## 2017-04-02 MED ORDER — ISOSORBIDE MONONITRATE ER 30 MG PO TB24: 30.0000 mg | ORAL_TABLET | Freq: Every day | ORAL | 1 refills | Status: DC

## 2017-04-02 NOTE — Assessment & Plan Note (Signed)
History of chest pain with negative Myoview 10/01/15. She did have a negative cath by Dr. Doylene Canard on  11/30/05 because of recurrent chest pain as well. I'm going to add Imdur 50 mg a day we'll see her back in 3 months. She has no recurrent chest pain at that time we'll continue to follow her medically otherwise, we will repeat a cardiac catheterization. She was recently hospitalized at [facility] 03/12/17 for one day with chest pain and ruled out for myocardial infarction.

## 2017-04-02 NOTE — Patient Instructions (Addendum)
Medication Instructions: START isosorbide 30 mg--take 1/2 tab (15 mg) daily.  START Amlodipine 5 mg daily.   Follow-Up: Your physician recommends that you schedule a follow-up appointment in: 3 months with Dr. Gwenlyn Found.

## 2017-04-02 NOTE — Progress Notes (Signed)
04/02/2017 LOYDA COSTIN   02-25-1936  [id number]  Primary Physician [name], MD Primary Cardiologist: Lorretta Harp MD Renae Gloss  HPI:  Mrs. Mawhinney is a 81 year old moderately overweight very Caucasian female mother of 62, grandmother and 3 grandchildren who i last saw in the office 11/25/16. She was referred by Dr. Maudie Mercury for cardiovascular evaluation because of a recent episode of chest pain. Her risk factors include treated hypertension and hyperlipidemia as well as a strong family history of heart disease with a father who died of a myocardial infarction, a brother who's had stents and a son who's had a heart attack at age 80. She has never had a heart attack or stroke. She had recent nocturnal chest pain 2 days in a row resulting in ER evaluation. Her blood pressure has been high recently. Since I saw her approximately 6 weeks ago we obtained a Myoview stress test which was low risk although the ejection fraction was 40% by gated SPECT. After adjusting her antihypertensive medications for blood pressure came down nicely and her chest pain has resolved. She has done well since I last saw her in February of this year until recently when she was admitted with slurred speech and dehydration. Turns out that she was taking twice the dose of her oral diuretic. Her blood pressure medicines were adjusted and now she is only on hydralazine.  She was admitted to [facility]/11/16 for 2 days in heart failure and A. fib. Her EF was 35%. She was diuresed. She was placed on oral anticoagulation. Since discharge her weight has remained stable. She had another admission 03/12/17 with chest pain. She ruled out for myocardial infarction she did have a heart catheterization performed 11/30/05 by Dr. Doylene Canard revealing essentially normal coronary arteries with only mild scattered CAD.   Current Outpatient Prescriptions  Medication Sig Dispense Refill  . acetaminophen (TYLENOL) 500 MG tablet Take  500 mg by mouth every 6 (six) hours as needed for moderate pain.     . brimonidine (ALPHAGAN) 0.2 % ophthalmic solution Place 2 drops into both eyes daily.     . cetirizine (ZYRTEC) 10 MG tablet Take 10 mg by mouth daily.    . Cholecalciferol 5000 units TABS Take 5,000 Units by mouth every evening.    . diazepam (VALIUM) 5 MG tablet Take 2.5 mg by mouth every 6 (six) hours as needed for anxiety or sedation.     . dorzolamide-timolol (COSOPT) 22.3-6.8 MG/ML ophthalmic solution Place 1 drop into both eyes 2 (two) times daily.     [name] ELIQUIS 5 MG TABS tablet TAKE 1 TABLET TWICE A DAY 180 tablet 1  . furosemide (LASIX) 40 MG tablet TAKE 1 TABLET (40 MG TOTAL) BY MOUTH DAILY. 30 tablet 1  . KLOR-CON M20 20 MEQ tablet TAKE 2 TABLETS (40 MEQ TOTAL) BY MOUTH DAILY. (Patient taking differently: Take 20 mEq by mouth daily. ) 60 tablet 6  . latanoprost (XALATAN) 0.005 % ophthalmic solution Place 1 drop into both eyes at bedtime.    [name] levothyroxine (SYNTHROID, LEVOTHROID) 88 MCG tablet Take 88 mcg by mouth daily before breakfast.    . meclizine (ANTIVERT) 25 MG tablet Take 25 mg by mouth 3 (three) times daily as needed for dizziness.    . metoprolol tartrate (LOPRESSOR) 25 MG tablet Take 1 tablet by mouth twice a day and take 1 tablet daily AS NEEDED FOR PALPITATIONS 270 tablet 3  . metoprolol tartrate (LOPRESSOR) 25 MG  tablet TAKE 1 TABLET (25 MG TOTAL) BY MOUTH 2 (TWO) TIMES DAILY. 60 tablet 6  . Multiple Vitamin (MULTIVITAMIN WITH MINERALS) TABS tablet Take 1 tablet by mouth daily.    . Multiple Vitamins-Minerals (PRESERVISION AREDS 2) CAPS Take 1 capsule by mouth 2 (two) times daily.    . nitroGLYCERIN (NITROSTAT) 0.4 MG SL tablet Place 1 tablet (0.4 mg total) under the tongue every 5 (five) minutes as needed for chest pain. 25 tablet 6  . NON FORMULARY Inhale 1 application into the lungs at bedtime. CPAP machine     . omeprazole (PRILOSEC) 20 MG capsule Take 20 mg by mouth daily as needed (heartburn).       . prednisoLONE acetate (PRED FORTE) 1 % ophthalmic suspension Place 1 drop into the left eye 2 (two) times daily.    . rosuvastatin (CRESTOR) 20 MG tablet Take 20 mg by mouth every evening.    . traZODone (DESYREL) 50 MG tablet Take 50 mg by mouth at bedtime.    [name] amLODipine (NORVASC) 5 MG tablet Take 1 tablet (5 mg total) by mouth daily. 180 tablet 1  . isosorbide mononitrate (IMDUR) 30 MG 24 hr tablet Take 0.5 tablets (15 mg total) by mouth daily. 45 tablet 1   No current facility-administered medications for this visit.     Allergies  Allergen Reactions  . Amitriptyline Other (See Comments)    Weakness   . Edarbi [Azilsartan] Other (See Comments)    Lupus  . Morphine And Related Other (See Comments)    "MAKES ME CRAZY"  . Amlodipine Other (See Comments)    Headache   . Atenolol Other (See Comments)    "decreases blood pressure"  . Codeine Nausea Only  . Doxazosin Other (See Comments)    Leg pain  . Dyazide [Hydrochlorothiazide W-Triamterene] Itching  . Erythromycin Diarrhea and Other (See Comments)    Also cramping  . Labetalol Other (See Comments)    Bradycardia   . Levatol [Penbutolol Sulfate] Other (See Comments)    Eye swelling  . Losartan Other (See Comments)    headache  . Monopril [Fosinopril] Hives  . Contrast Media [Iodinated Diagnostic Agents]   . Ace Inhibitors Cough  . Ampicillin Rash  . Belviq [Lorcaserin Hcl] Itching  . Butrans [Buprenorphine] Rash  . Clotrimazole-Betamethasone Rash  . Elocon [Mometasone Furoate] Rash  . Latex Rash    " IF ON ME FOR OVER 24 HOURS"  . Penicillins Other (See Comments)    "in large doses"  . Propranolol Itching    Social History   Social History  . Marital status: Married    Spouse name: N/A  . Number of children: N/A  . Years of education: N/A   Occupational History  . Not on file.   Social History Main Topics  . Smoking status: Former Smoker    Packs/day: 1.00    Types: Cigarettes    Start date:  08/09/1960  . Smokeless tobacco: Never Used  . Alcohol use No  . Drug use: No  . Sexual activity: Not on file   Other Topics Concern  . Not on file   Social History Narrative  . No narrative on file     Review of Systems: General: negative for chills, fever, night sweats or weight changes.  Cardiovascular: negative for chest pain, dyspnea on exertion, edema, orthopnea, palpitations, paroxysmal nocturnal dyspnea or shortness of breath Dermatological: negative for rash Respiratory: negative for cough or wheezing Urologic: negative for hematuria Abdominal: negative  for nausea, vomiting, diarrhea, bright red blood per rectum, melena, or hematemesis Neurologic: negative for visual changes, syncope, or dizziness All other systems reviewed and are otherwise negative except as noted above.    Blood pressure (!) 152/108, pulse (!) 58, height 5\' 2"  (1.575 m), weight 206 lb 3.2 oz (93.5 kg).  General appearance: alert and no distress Neck: no adenopathy, no carotid bruit, no JVD, supple, symmetrical, trachea midline and thyroid not enlarged, symmetric, no tenderness/mass/nodules Lungs: clear to auscultation bilaterally Heart: irregularly irregular rhythm Extremities: extremities normal, atraumatic, no cyanosis or edema  EKG Atrial fibrillation with a ventricular response of 58, evidence of LVH with nonspecific ST and T-wave changes. I personally reviewed this EKG.  ASSESSMENT AND PLAN:   HYPERTENSION, BENIGN History of essential hypertension with blood pressure measured today at 152/108. She is on metoprolol and has moderate renal insufficiency. I'm going add amlodipine 5 mg a day.  Chest pain History of chest pain with negative Myoview 10/01/15. She did have a negative cath by Dr. Doylene Canard on  11/30/05 because of recurrent chest pain as well. I'm going to add Imdur 50 mg a day we'll see her back in 3 months. She has no recurrent chest pain at that time we'll continue to follow her  medically otherwise, we will repeat a cardiac catheterization. She was recently hospitalized at [facility] 03/12/17 for one day with chest pain and ruled out for myocardial infarction.  Hyperlipidemia History of hyperlipidemia on statin therapy followed by her PCP  PAF (paroxysmal atrial fibrillation) ([place]) History of persistent atrial fibrillation on oral anticoagulation rate controlled.      Lorretta Harp MD FACP,FACC,FAHA, [facility] 04/02/2017 4:05 PM

## 2017-04-02 NOTE — Assessment & Plan Note (Signed)
History of persistent atrial fibrillation on oral anticoagulation rate controlled.

## 2017-04-02 NOTE — Assessment & Plan Note (Signed)
History of hyperlipidemia on statin therapy followed by her PCP. 

## 2017-04-02 NOTE — Assessment & Plan Note (Signed)
History of essential hypertension with blood pressure measured today at 152/108. She is on metoprolol and has moderate renal insufficiency. I'm going add amlodipine 5 mg a day.

## 2017-04-02 NOTE — Addendum Note (Signed)
Addended by: Zebedee Iba on: 04/02/2017 04:18 PM   Modules accepted: Orders

## 2017-04-03 ENCOUNTER — Other Ambulatory Visit: Payer: Self-pay | Admitting: Cardiology

## 2017-04-22 ENCOUNTER — Other Ambulatory Visit: Payer: Self-pay | Admitting: *Deleted

## 2017-04-22 DIAGNOSIS — R911 Solitary pulmonary nodule: Secondary | ICD-10-CM

## 2017-04-29 ENCOUNTER — Other Ambulatory Visit: Payer: Self-pay | Admitting: Cardiovascular Disease

## 2017-06-01 ENCOUNTER — Ambulatory Visit
Admission: RE | Admit: 2017-06-01 | Discharge: 2017-06-01 | Disposition: A | Discharge status: Home / Self Care | Payer: Medicare Other | Source: Ambulatory Visit | Attending: Thoracic Surgery (Cardiothoracic Vascular Surgery) | Admitting: Thoracic Surgery (Cardiothoracic Vascular Surgery)

## 2017-06-01 ENCOUNTER — Ambulatory Visit (INDEPENDENT_AMBULATORY_CARE_PROVIDER_SITE_OTHER): Payer: Medicare Other | Admitting: Thoracic Surgery (Cardiothoracic Vascular Surgery)

## 2017-06-01 ENCOUNTER — Encounter: Payer: Self-pay | Admitting: Thoracic Surgery (Cardiothoracic Vascular Surgery)

## 2017-06-01 VITALS — BP 200/100 | HR 62 | Resp 20 | Ht 62.0 in | Wt 205.0 lb

## 2017-06-01 DIAGNOSIS — Z853 Personal history of malignant neoplasm of breast: Secondary | ICD-10-CM

## 2017-06-01 DIAGNOSIS — R911 Solitary pulmonary nodule: Secondary | ICD-10-CM

## 2017-06-01 NOTE — Progress Notes (Signed)
[address]       [place] [address]             [id number]       HPI: Amy Hahn returns for a 1 year follow-up visit.   She is a 81 year old woman with a history of obesity, right lung nodule, hypertension, hyperlipidemia, paroxysmal atrial fibrillation, chronic combined systolic and diastolic heart failure, sleep apnea, and anxiety. I have been following her for a right lower lobe nodule since 2013. There has been minimal size change during that time interval. I last saw her in the office a year ago and there was a question of slight interval growth of the nodule.  He says she's been feeling poorly. She's been having problems with vertigo and has been started on meclizine for that. She's not sure if this really helped or not. She also complains of some visual hallucinations, "seeing people that aren't there, but nothing really bad." She says she has an occasional sensation of chest discomfort, but can't be more specific.  Past Medical History:  Diagnosis Date  . Anxiety   . Atrial fibrillation ([place])    a. paroxysmal, diagnosed in 08/2016 and started on Eliquis  . Breast cancer ([place])   . Cataracts, bilateral   . Chronic combined systolic and diastolic CHF (congestive heart failure) ([place])    a. echo 11/2015: EF 45-50%  b. echo 08/2016: EF 35-40%, diffuse HK, akinesis of mid inferoseptal and apical septal myocardium, mild AR, mild MR, LA moderately dilated.  . Complication of anesthesia   . GERD (gastroesophageal reflux disease)   . Glaucoma   . History of kidney stones    HISTORY OF  . Hyperlipidemia   . Hypertension   . Macular degeneration   . Nodule of right lung   . Obesity   . PONV (postoperative nausea and vomiting)   . Sleep apnea     Current Outpatient Prescriptions  Medication Sig Dispense Refill  . acetaminophen (TYLENOL) 500 MG tablet Take 500 mg by mouth every 6 (six) hours as needed for moderate pain.     . brimonidine (ALPHAGAN) 0.2 %  ophthalmic solution Place 2 drops into both eyes daily.     . cetirizine (ZYRTEC) 10 MG tablet Take 10 mg by mouth daily.    . Cholecalciferol 5000 units TABS Take 5,000 Units by mouth every evening.    . diazepam (VALIUM) 5 MG tablet Take 2.5 mg by mouth every 6 (six) hours as needed for anxiety or sedation.     . dorzolamide-timolol (COSOPT) 22.3-6.8 MG/ML ophthalmic solution Place 1 drop into both eyes 2 (two) times daily.     [name] ELIQUIS 5 MG TABS tablet TAKE 1 TABLET TWICE A DAY 180 tablet 1  . furosemide (LASIX) 40 MG tablet TAKE 1 TABLET BY MOUTH EVERY DAY 90 tablet 3  . isosorbide mononitrate (IMDUR) 30 MG 24 hr tablet Take 0.5 tablets (15 mg total) by mouth daily. 45 tablet 1  . latanoprost (XALATAN) 0.005 % ophthalmic solution Place 1 drop into both eyes at bedtime.    [name] levothyroxine (SYNTHROID, LEVOTHROID) 88 MCG tablet Take 88 mcg by mouth daily before breakfast.    . meclizine (ANTIVERT) 25 MG tablet Take 25 mg by mouth 3 (three) times daily as needed for dizziness.    . metoprolol tartrate (LOPRESSOR) 25 MG tablet Take 1 tablet by mouth twice a day and take 1 tablet daily AS NEEDED FOR PALPITATIONS 270 tablet  3  . metoprolol tartrate (LOPRESSOR) 25 MG tablet TAKE 1 TABLET (25 MG TOTAL) BY MOUTH 2 (TWO) TIMES DAILY. 60 tablet 6  . metoprolol tartrate (LOPRESSOR) 25 MG tablet TAKE 1 TABLET (25 MG TOTAL) BY MOUTH 2 (TWO) TIMES DAILY. 60 tablet 6  . Multiple Vitamin (MULTIVITAMIN WITH MINERALS) TABS tablet Take 1 tablet by mouth daily.    . Multiple Vitamins-Minerals (PRESERVISION AREDS 2) CAPS Take 1 capsule by mouth 2 (two) times daily.    . nitroGLYCERIN (NITROSTAT) 0.4 MG SL tablet Place 1 tablet (0.4 mg total) under the tongue every 5 (five) minutes as needed for chest pain. 25 tablet 6  . NON FORMULARY Inhale 1 application into the lungs at bedtime. CPAP machine     . omeprazole (PRILOSEC) 20 MG capsule Take 20 mg by mouth daily as needed (heartburn).     . potassium chloride SA  (KLOR-CON M20) 20 MEQ tablet Take 2 tablets (40 mEq total) by mouth daily. 180 tablet 0  . prednisoLONE acetate (PRED FORTE) 1 % ophthalmic suspension Place 1 drop into the left eye 2 (two) times daily.    . rosuvastatin (CRESTOR) 20 MG tablet Take 20 mg by mouth every evening.    . traZODone (DESYREL) 50 MG tablet Take 50 mg by mouth at bedtime.    [name] amLODipine (NORVASC) 5 MG tablet Take 1 tablet (5 mg total) by mouth daily. (Patient not taking: Reported on 06/01/2017) 180 tablet 1   No current facility-administered medications for this visit.     Physical Exam BP (!) 200/100 (BP Location: Right Arm, Patient Position: Sitting, Cuff Size: Large)   Pulse 62   Resp 20   Ht 5\' 2"  (1.575 m)   Wt 205 lb (93 kg)   SpO2 95% Comment: RA  BMI 37.49 kg/m  Morbidly obese 81 year old woman in no acute distress Alert and oriented 3 no focal motor deficits Cardiac regular rate and rhythm normal S1 and S2 Lungs diminished bilaterally but no rales or wheezing  Diagnostic Tests: CT CHEST WITHOUT CONTRAST  TECHNIQUE: Multidetector CT imaging of the chest was performed following the standard protocol without IV contrast.  COMPARISON:  Chest x-ray of 03/12/2017 and CT chest of 05/26/2016  FINDINGS: Cardiovascular: Moderate to severe thoracic aortic atherosclerosis is noted. There are diffuse coronary artery calcifications present. Cardiomegaly is stable. No pericardial effusion is noted. The mid ascending thoracic aorta measures 40 mm in diameter. Recommend annual imaging followup by CTA or MRA. This recommendation follows 2010 ACCF/AHA/AATS/ACR/ASA/SCA/SCAI/SIR/STS/SVM Guidelines for the Diagnosis and Management of Patients with Thoracic Aortic Disease. Circulation. 2010; 121: H371-I967. Prominent pulmonary artery trunks again are noted consistent with a degree of pulmonary arterial hypertension.  Mediastinum/Nodes: On this unenhanced study, only small mediastinal lymph nodes are  present. No adenopathy is seen. There is enlargement of the thyroid gland diffusely most consistent with thyroid goiter. Clinical correlation is recommended.  Lungs/Pleura: On lung window images radiation fibrosis is again noted anteriorly within the right upper lobe and is stable. The well-circumscribed noncalcified nodule within the right lower lobe is stable as well measuring 10 mm in diameter. The small noncalcified 3 mm nodule in the left lower lobe is stable as is a calcified granuloma within the left lower lobe. No new or enlarging pulmonary nodule is seen. No pleural effusion is noted. Linear scarring is present in the left lower lobe. The central airway is patent.  Upper Abdomen: Changes of right mastectomy are noted. What is visualized of the upper abdomen  is unremarkable on this unenhanced study. A small hyperdense cyst appears unchanged emanating from the upper pole of the left kidney posterolaterally pre  Musculoskeletal: There is a slight thoracic kyphosis present. There are degenerative changes in the mid to lower thoracic spine.  IMPRESSION: 1. Completely stable noncalcified 10 mm nodule in the right lower lobe. This nodule has been relatively stable since the CT chest of 2013 and is considered a benign process. 2. The mid ascending thoracic aorta measures 40 mm in diameter. Recommend annual imaging followup by CTA or MRA. This recommendation follows 2010 ACCF/AHA/AATS/ACR/ASA/SCA/SCAI/SIR/STS/SVM Guidelines for the Diagnosis and Management of Patients with Thoracic Aortic Disease. Circulation. 2010; 121: T364-W803 3. Changes of radiation fibrosis anteriorly in the right upper lobe appear stable. 4. Prior right mastectomy.   Electronically Signed   By: Ivar Drape M.D.   On: 06/01/2017 12:29 I personally reviewed the CT images and concur with the findings noted above  Impression: Amy Hahn is a 81 year old woman whom I have been following following  since 2013 for right lower lobe lung nodule. Her ascending aorta is also near the upper limits of normal.  Lung nodule- right lower lobe lung nodule is unchanged over the past year. This is consistent with a benign process.  Hypertension- blood pressure markedly elevated today. Difficult to obtain blood pressure due to body habitus and may be a cuff mismatch issue, but is concerning and she needs to follow-up with Dr. Maudie Mercury regarding that.  Thoracic aortic atherosclerosis- moderate to severe calcific atherosclerotic changes in the thoracic aorta. Her ascending aorta is borderline aneurysmal around 4 cm. LV get significantly changed but does bear follow-up. We'll plan a repeat CT in one year.  Plan: Follow-up with Dr. Maudie Mercury as soon as possible regarding visual hallucinations and blood pressure  Return in one year with CT chest. We'll plan to do CT without IV contrast unless ascending aorta starts increasing in size.  Melrose Nakayama, MD [facility] [phone]

## 2017-06-03 ENCOUNTER — Emergency Department (HOSPITAL_COMMUNITY)
Admission: EM | Admit: 2017-06-03 | Discharge: 2017-06-04 | Disposition: A | Discharge status: Home / Self Care | Payer: Medicare Other | Attending: Emergency Medicine | Admitting: Emergency Medicine

## 2017-06-03 ENCOUNTER — Encounter (HOSPITAL_COMMUNITY): Payer: Self-pay | Admitting: Emergency Medicine

## 2017-06-03 DIAGNOSIS — Z7901 Long term (current) use of anticoagulants: Secondary | ICD-10-CM | POA: Insufficient documentation

## 2017-06-03 DIAGNOSIS — E785 Hyperlipidemia, unspecified: Secondary | ICD-10-CM | POA: Insufficient documentation

## 2017-06-03 DIAGNOSIS — Z91040 Latex allergy status: Secondary | ICD-10-CM | POA: Diagnosis not present

## 2017-06-03 DIAGNOSIS — Z79899 Other long term (current) drug therapy: Secondary | ICD-10-CM | POA: Insufficient documentation

## 2017-06-03 DIAGNOSIS — R443 Hallucinations, unspecified: Secondary | ICD-10-CM | POA: Diagnosis not present

## 2017-06-03 DIAGNOSIS — Z87891 Personal history of nicotine dependence: Secondary | ICD-10-CM | POA: Diagnosis not present

## 2017-06-03 DIAGNOSIS — I10 Essential (primary) hypertension: Secondary | ICD-10-CM

## 2017-06-03 DIAGNOSIS — H538 Other visual disturbances: Secondary | ICD-10-CM | POA: Diagnosis present

## 2017-06-03 DIAGNOSIS — E039 Hypothyroidism, unspecified: Secondary | ICD-10-CM | POA: Diagnosis not present

## 2017-06-03 DIAGNOSIS — I110 Hypertensive heart disease with heart failure: Secondary | ICD-10-CM | POA: Diagnosis not present

## 2017-06-03 DIAGNOSIS — I509 Heart failure, unspecified: Secondary | ICD-10-CM | POA: Diagnosis not present

## 2017-06-03 LAB — COMPREHENSIVE METABOLIC PANEL
ALBUMIN: 3.3 g/dL — AB (ref 3.5–5.0)
ALK PHOS: 70 U/L (ref 38–126)
ALT: 12 U/L — AB (ref 14–54)
AST: 25 U/L (ref 15–41)
Anion gap: 10 (ref 5–15)
BUN: 16 mg/dL (ref 6–20)
CHLORIDE: 103 mmol/L (ref 101–111)
CO2: 26 mmol/L (ref 22–32)
CREATININE: 1.26 mg/dL — AB (ref 0.44–1.00)
Calcium: 9.7 mg/dL (ref 8.9–10.3)
GFR calc Af Amer: 46 mL/min — ABNORMAL LOW (ref >60)
GFR, EST NON AFRICAN AMERICAN: 39 mL/min — AB (ref >60)
Glucose, Bld: 152 mg/dL — ABNORMAL HIGH (ref 65–99)
Potassium: 3.2 mmol/L — ABNORMAL LOW (ref 3.5–5.1)
SODIUM: 139 mmol/L (ref 135–145)
TOTAL PROTEIN: 6.8 g/dL (ref 6.5–8.1)
Total Bilirubin: 0.7 mg/dL (ref 0.3–1.2)

## 2017-06-03 LAB — CBC
HCT: 36.8 % (ref 36.0–46.0)
Hemoglobin: 11.5 g/dL — ABNORMAL LOW (ref 12.0–15.0)
MCH: 32 pg (ref 26.0–34.0)
MCHC: 31.3 g/dL (ref 30.0–36.0)
MCV: 102.5 fL — ABNORMAL HIGH (ref 78.0–100.0)
Platelets: 250 K/uL (ref 150–400)
RBC: 3.59 MIL/uL — ABNORMAL LOW (ref 3.87–5.11)
RDW: 13.7 % (ref 11.5–15.5)
WBC: 6.9 K/uL (ref 4.0–10.5)

## 2017-06-03 LAB — TROPONIN I: Troponin I: 0.03 ng/mL

## 2017-06-03 MED ORDER — AMLODIPINE BESYLATE 5 MG PO TABS
5.0000 mg | ORAL_TABLET | Freq: Every day | ORAL | Status: DC
  Filled 2017-06-03: qty 1

## 2017-06-03 MED ORDER — METOPROLOL TARTRATE 25 MG PO TABS
25.0000 mg | ORAL_TABLET | Freq: Once | ORAL | Status: AC
  Administered 2017-06-03: 25 mg via ORAL
  Filled 2017-06-03: qty 1

## 2017-06-03 NOTE — ED Triage Notes (Signed)
Pt c/o HTN since yesterday, referred from PCP for same.  Pt c/o intermittent blurred vision and headache for past 2 weeks. Pt's daughter reports pt increasingly confused, states, "she sometimes sees things that aren't there," reports hallucinations intermittent for past few weeks.  BP 229/111 in triage. No focal deficits noted at this time.

## 2017-06-03 NOTE — ED Notes (Signed)
rn aware of troponin

## 2017-06-04 NOTE — ED Provider Notes (Signed)
[facility] DEPT Provider Note   CSN: [id number] Arrival date & time: 06/03/17  1643     History   Chief Complaint Chief Complaint  Patient presents with  . Hypertension    HPI Amy Hahn is a 81 y.o. female.  This is a 81 year old female with PMH of HTN, paroxysmal atrial flutter-A. fib on Eliquis, CHF with last known EF 40% 2017, glaucoma, macular degeneration, ascending and transverse aortic aneurysm who presents with concern for high blood pressure.  Patient presents with her daughter and husband in which both are concerned about patient's recurrent high blood pressures since yesterday and were referred after calling her PCP today on the phone who stated they should present to the ED for further evaluation.  Patient has been taking medication as prescribed however missed the dose 2 days prior.  Patient's daughter also states they are concerned that the patient is becoming increasingly confused at home and "seeing things that are not there ".  They report intermittent hallucinations for the past 6 months on occasion.  In triage the patient's blood pressure 229/111.  The patient and family deny fevers, chest pain, change in her vision from baseline, numbness and tingling in her extremities, back pain, abdominal pain.  Patient was evaluated by her cardiothoracic surgeon 2 days prior for her right lung nodule as well as thoracic aortic aneurysm measuring around 4 cm.      Past Medical History:  Diagnosis Date  . Anxiety   . Atrial fibrillation ([place])    a. paroxysmal, diagnosed in 08/2016 and started on Eliquis  . Breast cancer ([place])   . Cataracts, bilateral   . Chronic combined systolic and diastolic CHF (congestive heart failure) ([place])    a. echo 11/2015: EF 45-50%  b. echo 08/2016: EF 35-40%, diffuse HK, akinesis of mid inferoseptal and apical septal myocardium, mild AR, mild MR, LA moderately dilated.  . Complication of anesthesia   . GERD (gastroesophageal reflux disease)     . Glaucoma   . History of kidney stones    HISTORY OF  . Hyperlipidemia   . Hypertension   . Macular degeneration   . Nodule of right lung   . Obesity   . PONV (postoperative nausea and vomiting)   . Sleep apnea     Patient Active Problem List   Diagnosis Date Noted  . Chronic combined systolic and diastolic CHF (congestive heart failure) ([place]) 09/30/2016  . PAF (paroxysmal atrial fibrillation) ([place]) 09/30/2016  . Essential hypertension 09/30/2016  . Acute systolic heart failure ([place]) 08/13/2016  . Gait abnormality 07/01/2016  . OSA on CPAP 07/01/2016  . Slurred speech 07/01/2016  . Hyperlipidemia 09/23/2015  . Lung nodule, solitary 07/12/2012  . Breast cancer, right breast ([place]) 07/11/2012  . Carotid stenosis 06/19/2012  . Bradycardia 06/02/2012  . Dizziness 04/27/2012  . Chest pain 04/27/2012  . Palpitations 04/27/2012  . Hypothyroidism 10/17/2008  . HYPERTENSION, BENIGN 10/17/2008    Past Surgical History:  Procedure Laterality Date  . CATARACT EXTRACTION, BILATERAL    . CESAREAN SECTION    . MASTECTOMY Right 1997 ?  [name] TOTAL KNEE ARTHROPLASTY     x2    OB History    No data available       Home Medications    Prior to Admission medications   Medication Sig Start Date End Date Taking? Authorizing Provider  acetaminophen (TYLENOL) 500 MG tablet Take 500 mg by mouth every 6 (six) hours as needed for moderate pain.  Yes [provider]  brimonidine (ALPHAGAN) 0.2 % ophthalmic solution Place 2 drops into the right eye daily.  03/15/15  Yes [provider]  cetirizine (ZYRTEC) 10 MG tablet Take 10 mg by mouth daily as needed for allergies.    Yes [provider]  Cholecalciferol 5000 units TABS Take 5,000 Units by mouth every evening.   Yes [provider]  dorzolamide-timolol (COSOPT) 22.3-6.8 MG/ML ophthalmic solution Place 1 drop into the right eye 2 (two) times daily.  04/12/12  Yes [provider]  ELIQUIS 5 MG  TABS tablet TAKE 1 TABLET TWICE A DAY Patient taking differently: Take 5 mg by mouth two times a day 03/09/17  Yes Lorretta Harp, MD  furosemide (LASIX) 40 MG tablet TAKE 1 TABLET BY MOUTH EVERY DAY Patient taking differently: Take 40 mg by mouth once a day 04/29/17  Yes Lorretta Harp, MD  isosorbide mononitrate (IMDUR) 30 MG 24 hr tablet Take 0.5 tablets (15 mg total) by mouth daily. 04/02/17 07/01/17 Yes Lorretta Harp, MD  latanoprost (XALATAN) 0.005 % ophthalmic solution Place 1 drop into both eyes at bedtime.   Yes [provider]  levothyroxine (SYNTHROID, LEVOTHROID) 88 MCG tablet Take 88 mcg by mouth daily before breakfast.   Yes [provider]  meclizine (ANTIVERT) 25 MG tablet Take 25 mg by mouth at bedtime.    Yes [provider]  metoprolol tartrate (LOPRESSOR) 25 MG tablet TAKE 1 TABLET (25 MG TOTAL) BY MOUTH 2 (TWO) TIMES DAILY. Patient taking differently: Take 25 mg by mouth 2 (two) times daily. AND MAY TAKE AN ADDITIONAL 25 MG DAILY IF NEEDED FOR PALPITATIONS 04/05/17  Yes Cheryln Manly, NP  Multiple Vitamin (MULTIVITAMIN WITH MINERALS) TABS tablet Take 1 tablet by mouth daily.   Yes [provider]  Multiple Vitamins-Minerals (PRESERVISION AREDS 2) CAPS Take 1 capsule by mouth 2 (two) times daily.   Yes [provider]  nitroGLYCERIN (NITROSTAT) 0.4 MG SL tablet Place 1 tablet (0.4 mg total) under the tongue every 5 (five) minutes as needed for chest pain. 11/25/16  Yes Lorretta Harp, MD  omeprazole (PRILOSEC) 20 MG capsule Take 20 mg by mouth at bedtime.  03/24/12  Yes [provider]  potassium chloride SA (KLOR-CON M20) 20 MEQ tablet Take 2 tablets (40 mEq total) by mouth daily. Patient taking differently: Take 20 mEq by mouth at bedtime.  04/02/17  Yes Lorretta Harp, MD  prednisoLONE acetate (PRED FORTE) 1 % ophthalmic suspension Place 1 drop into the left eye 2 (two) times daily.   Yes [provider]    rosuvastatin (CRESTOR) 20 MG tablet Take 20 mg by mouth every evening.   Yes [provider]  traZODone (DESYREL) 50 MG tablet Take 50 mg by mouth at bedtime.   Yes [provider]  amLODipine (NORVASC) 5 MG tablet Take 1 tablet (5 mg total) by mouth daily. 04/02/17 07/01/17  Lorretta Harp, MD  metoprolol tartrate (LOPRESSOR) 25 MG tablet Take 1 tablet by mouth twice a day and take 1 tablet daily AS NEEDED FOR PALPITATIONS Patient not taking: Reported on 06/03/2017 08/21/16   Erma Heritage, PA-C    Family History Family History  Problem Relation Age of Onset  . Diabetes Mother   . Macular degeneration Mother   . Heart attack Father   . Cancer Father        Colon and Prostate Cancer  . Diabetes Sister   . Diabetes  Brother   . Heart attack Brother   . Diabetes Brother     Social History Social History  Substance Use Topics  . Smoking status: Former Smoker    Packs/day: 1.00    Types: Cigarettes    Start date: 08/09/1960  . Smokeless tobacco: Never Used  . Alcohol use No     Allergies   Amitriptyline; Edarbi [azilsartan]; Morphine and related; Amlodipine; Atenolol; Codeine; Doxazosin; Dyazide [hydrochlorothiazide w-triamterene]; Erythromycin; Labetalol; Levatol [penbutolol sulfate]; Losartan; Monopril [fosinopril]; Contrast media [iodinated diagnostic agents]; Ace inhibitors; Ampicillin; Belviq [lorcaserin hcl]; Butrans [buprenorphine]; Clotrimazole-betamethasone; Elocon [mometasone furoate]; Latex; Penicillins; Propranolol; and Tape   Review of Systems Review of Systems  Constitutional: Negative for chills, diaphoresis and fever.  HENT: Negative for ear pain and sore throat.   Eyes: Positive for visual disturbance. Negative for pain.  Respiratory: Negative for cough, chest tightness and shortness of breath.   Cardiovascular: Negative for chest pain, palpitations and leg swelling.  Gastrointestinal: Negative for abdominal pain, blood in stool,  constipation, diarrhea, nausea and vomiting.  Genitourinary: Negative for dysuria and hematuria.  Musculoskeletal: Negative for arthralgias and back pain.  Skin: Negative for color change and rash.  Neurological: Positive for weakness. Negative for dizziness, tremors, seizures, syncope, facial asymmetry, speech difficulty, light-headedness, numbness and headaches.  All other systems reviewed and are negative.    Physical Exam Updated Vital Signs BP (!) 184/92   Pulse (!) 46   Temp 98.7 F (37.1 C) (Oral)   Resp 19   SpO2 100%   Physical Exam  Constitutional: She is oriented to person, place, and time. She appears well-developed and well-nourished. No distress.  HENT:  Head: Normocephalic and atraumatic.  Eyes: Pupils are equal, round, and reactive to light. Conjunctivae are normal.  Neck: Normal range of motion. Neck supple.  Cardiovascular: Normal rate and regular rhythm.   No murmur heard. Pulmonary/Chest: Effort normal and breath sounds normal. No respiratory distress.  Abdominal: Soft. There is no tenderness.  Musculoskeletal: She exhibits no edema.  Neurological: She is alert and oriented to person, place, and time. She has normal strength. She displays no tremor. No sensory deficit. Coordination normal.  Skin: Skin is warm and dry.  Psychiatric: She has a normal mood and affect. Her speech is normal and behavior is normal.  Nursing note and vitals reviewed.    ED Treatments / Results  Labs (all labs ordered are listed, but only abnormal results are displayed) Labs Reviewed  COMPREHENSIVE METABOLIC PANEL - Abnormal; Notable for the following:       Result Value   Potassium 3.2 (*)    Glucose, Bld 152 (*)    Creatinine, Ser 1.26 (*)    Albumin 3.3 (*)    ALT 12 (*)    GFR calc non Af Amer 39 (*)    GFR calc Af Amer 46 (*)    All other components within normal limits  CBC - Abnormal; Notable for the following:    RBC 3.59 (*)    Hemoglobin 11.5 (*)    MCV 102.5  (*)    All other components within normal limits  TROPONIN I - Abnormal; Notable for the following:    Troponin I 0.03 (*)    All other components within normal limits  CBG MONITORING, ED    EKG  EKG Interpretation  Date/Time:  Thursday June 03 2017 17:19:56 EDT Ventricular Rate:  48 PR Interval:    QRS Duration: 90 QT Interval:  470 QTC Calculation: 419 R  Axis:   -30 Text Interpretation:  possible afib or sinus arrhythmia Artifact T wave abnormality Abnormal ekg Confirmed by Carmin Muskrat [phone]) on 06/03/2017 10:34:40 PM       Radiology No results found.  Procedures Procedures (including critical care time)  Medications Ordered in ED Medications  amLODipine (NORVASC) tablet 5 mg (not administered)  metoprolol tartrate (LOPRESSOR) tablet 25 mg (25 mg Oral Given 06/03/17 2250)     Initial Impression / Assessment and Plan / ED Course  I have reviewed the triage vital signs and the nursing notes.  Pertinent labs & imaging results that were available during my care of the patient were reviewed by me and considered in my medical decision making (see chart for details).     This is a 81 year old female with PMH of HTN, paroxysmal atrial flutter-A. fib on Eliquis, CHF with last known EF 40% 2017, glaucoma, macular degeneration, ascending and transverse aortic aneurysm who presents with concern for high blood pressure.  Patient alert and oriented x4 in the ED. Appropriately answering all questions.  Manual blood pressure difficult to acquire given patient's body habitus.  Manual blood pressures acquired which showed range of 180s-190s.  Patient was given her night medication.   Of note patient continued to remain completely asymptomatic, as noted above. Patient has chronic eye pathology, notes no change from baseline.  Troponin stable at 0.03 consistent with prior measurements with troponin leak and heart failure and aneurysm. Creatinine stable 1.26, down from prior  measurement 2 months ago.  After reading cardiothoracic surgery office note, aneurysm is borderline upper limit of normal at 4 cm and follow-up is scheduled in a year for CT chest.  Patient denies any chest pain or back pain or arm tingling or numbness.  Repeat assessment a few hours after night medication administration showed improvement in blood pressure 180s.  Given well-appearing patient, unremarkable physical exam findings, pertinent negative historical risk factors, will proceed with further outpatient management of patient's blood pressure with medication optimization and PCP follow-up, as well as investigation for patient's intermittent hallucinations with concern for developing dementia.   Return precautions given, all questions answered.  Final Clinical Impressions(s) / ED Diagnoses   Final diagnoses:  Essential hypertension    New Prescriptions Discharge Medication List as of 06/04/2017 12:45 AM       Aldona Lento, MD 06/04/17 0932    Carmin Muskrat, MD 06/09/17 [phone]

## 2017-06-08 ENCOUNTER — Other Ambulatory Visit: Payer: Self-pay | Admitting: *Deleted

## 2017-06-08 MED ORDER — POTASSIUM CHLORIDE 20 MEQ PO PACK: 20.0000 meq | PACK | Freq: Two times a day (BID) | ORAL | 6 refills | Status: DC

## 2017-06-22 ENCOUNTER — Ambulatory Visit (INDEPENDENT_AMBULATORY_CARE_PROVIDER_SITE_OTHER): Payer: Medicare Other | Admitting: Cardiovascular Disease

## 2017-06-22 ENCOUNTER — Encounter: Payer: Self-pay | Admitting: Cardiovascular Disease

## 2017-06-22 DIAGNOSIS — E7800 Pure hypercholesterolemia, unspecified: Secondary | ICD-10-CM

## 2017-06-22 DIAGNOSIS — I480 Paroxysmal atrial fibrillation: Secondary | ICD-10-CM

## 2017-06-22 DIAGNOSIS — I10 Essential (primary) hypertension: Secondary | ICD-10-CM | POA: Diagnosis not present

## 2017-06-22 MED ORDER — HYDRALAZINE HCL 25 MG PO TABS: 25.0000 mg | ORAL_TABLET | Freq: Three times a day (TID) | ORAL | 1 refills | Status: DC

## 2017-06-22 NOTE — Patient Instructions (Signed)
Medication Instructions: START Hydralazine 25 mg three times daily.   Follow-Up: Your physician recommends that you schedule a follow-up appointment in: 1 month with PharmD for BP Management. Your physician has requested that you regularly monitor and record your blood pressure readings at home. Please use the same machine at the same time of day to check your readings and record them to bring to your follow-up visit.  We request that you follow-up in: 3 months with Bernerd Pho, PA and in 6 months with Dr Andria Rhein will receive a reminder letter in the mail two months in advance. If you don't receive a letter, please call our office to schedule the follow-up appointment.  If you need a refill on your cardiac medications before your next appointment, please call your pharmacy.

## 2017-06-22 NOTE — Assessment & Plan Note (Signed)
history of hyperlipidemia on statin therapy followed by her PCP

## 2017-06-22 NOTE — Progress Notes (Signed)
06/22/2017 Amy Hahn   07/22/1936  [id number]  Primary Physician [name], MD Primary Cardiologist: Lorretta Harp MD FACP, [place], [place], [place]  HPI:  Amy Hahn is a 81 y.o. female  moderately overweight very Caucasian female mother of 48, grandmother and 3 grandchildren who i last saw in the office 04/02/17. She was referred by Dr. Maudie Mercury for cardiovascular evaluation because of a recent episode of chest pain. Her risk factors include treated hypertension and hyperlipidemia as well as a strong family history of heart disease with a father who died of a myocardial infarction, a brother who's had stents and a son who's had a heart attack at age 35. She has never had a heart attack or stroke. She had recent nocturnal chest pain 2 days in a row resulting in ER evaluation. Her blood pressure has been high recently. Since I saw her approximately 6 weeks ago we obtained a Myoview stress test which was low risk although the ejection fraction was 40% by gated SPECT. After adjusting her antihypertensive medications for blood pressure came down nicely and her chest pain has resolved. She has done well since I last saw her in February of this year until recently when she was admitted with slurred speech and dehydration. Turns out that she was taking twice the dose of her oral diuretic. Her blood pressure medicines were adjusted and now she is only on hydralazine.  She was admitted to [facility] 08/11/16 for 2 days in heart failure and A. fib. Her EF was 35%. She was diuresed. She was placed on oral anticoagulation. Since discharge her weight has remained stable. She had another admission 03/12/17 with chest pain. She ruled out for myocardial infarction she did have a heart catheterization performed 11/30/05 by Dr. Doylene Canard revealing essentially normal coronary arteries with only mild scattered CAD.  Since I saw her in the office approximately 3 months ago she was seen in the ER for labile hypertension which  ultimately improved over time. Blood pressure today is 140/80 that would home she says she measures it as moderately higher than is. She otherwise denies chest pain or shortness of breath.   Current Meds  Medication Sig  . acetaminophen (TYLENOL) 500 MG tablet Take 500 mg by mouth every 6 (six) hours as needed for moderate pain.   [name] amLODipine (NORVASC) 5 MG tablet Take 1 tablet (5 mg total) by mouth daily.  . brimonidine (ALPHAGAN) 0.2 % ophthalmic solution Place 2 drops into the right eye daily.   . cetirizine (ZYRTEC) 10 MG tablet Take 10 mg by mouth daily as needed for allergies.   . Cholecalciferol 5000 units TABS Take 5,000 Units by mouth every evening.  . dorzolamide-timolol (COSOPT) 22.3-6.8 MG/ML ophthalmic solution Place 1 drop into the right eye 2 (two) times daily.   [name] ELIQUIS 5 MG TABS tablet TAKE 1 TABLET TWICE A DAY (Patient taking differently: Take 5 mg by mouth two times a day)  . furosemide (LASIX) 40 MG tablet TAKE 1 TABLET BY MOUTH EVERY DAY (Patient taking differently: Take 40 mg by mouth once a day)  . isosorbide mononitrate (IMDUR) 30 MG 24 hr tablet Take 0.5 tablets (15 mg total) by mouth daily.  [name] latanoprost (XALATAN) 0.005 % ophthalmic solution Place 1 drop into both eyes at bedtime.  [name] levothyroxine (SYNTHROID, LEVOTHROID) 88 MCG tablet Take 88 mcg by mouth daily before breakfast.  . meclizine (ANTIVERT) 25 MG tablet Take 25 mg by mouth at bedtime.   [name]  metoprolol tartrate (LOPRESSOR) 25 MG tablet Take 1 tablet by mouth twice a day and take 1 tablet daily AS NEEDED FOR PALPITATIONS  . metoprolol tartrate (LOPRESSOR) 25 MG tablet TAKE 1 TABLET (25 MG TOTAL) BY MOUTH 2 (TWO) TIMES DAILY. (Patient taking differently: Take 25 mg by mouth 2 (two) times daily. AND MAY TAKE AN ADDITIONAL 25 MG DAILY IF NEEDED FOR PALPITATIONS)  . Multiple Vitamin (MULTIVITAMIN WITH MINERALS) TABS tablet Take 1 tablet by mouth daily.  . Multiple Vitamins-Minerals (PRESERVISION AREDS 2) CAPS  Take 1 capsule by mouth 2 (two) times daily.  . nitroGLYCERIN (NITROSTAT) 0.4 MG SL tablet Place 1 tablet (0.4 mg total) under the tongue every 5 (five) minutes as needed for chest pain.  [name] omeprazole (PRILOSEC) 20 MG capsule Take 20 mg by mouth at bedtime.   . potassium chloride (KLOR-CON) 20 MEQ packet Take 20 mEq by mouth 2 (two) times daily.  . prednisoLONE acetate (PRED FORTE) 1 % ophthalmic suspension Place 1 drop into the left eye 2 (two) times daily.  . rosuvastatin (CRESTOR) 20 MG tablet Take 20 mg by mouth every evening.  . traZODone (DESYREL) 50 MG tablet Take 50 mg by mouth at bedtime.     Allergies  Allergen Reactions  . Amitriptyline Other (See Comments)    Weakness   . Edarbi [Azilsartan] Other (See Comments)    Lupus  . Morphine And Related Other (See Comments)    "MAKES ME CRAZY"  . Amlodipine Other (See Comments)    Headache   . Atenolol Other (See Comments)    "decreases blood pressure"  . Codeine Nausea Only  . Doxazosin Other (See Comments)    Leg pain  . Dyazide [Hydrochlorothiazide W-Triamterene] Itching  . Erythromycin Diarrhea and Other (See Comments)    Also cramping  . Labetalol Other (See Comments)    Bradycardia   . Levatol [Penbutolol Sulfate] Swelling    Eye swelling  . Losartan Other (See Comments)    Headache   . Monopril [Fosinopril] Hives  . Contrast Media [Iodinated Diagnostic Agents]   . Ace Inhibitors Cough  . Ampicillin Rash  . Belviq [Lorcaserin Hcl] Itching  . Butrans [Buprenorphine] Rash  . Clotrimazole-Betamethasone Rash  . Elocon [Mometasone Furoate] Rash  . Latex Rash    " IF ON ME FOR OVER 24 HOURS"  . Penicillins Itching and Rash    Has patient had a PCN reaction causing immediate rash, facial/tongue/throat swelling, SOB or lightheadedness with hypotension: Yes Has patient had a PCN reaction causing severe rash involving mucus membranes or skin necrosis: Unknown Has patient had a PCN reaction that required  hospitalization: Unknown Has patient had a PCN reaction occurring within the last 10 years: No If all of the above answers are "NO", then may proceed with Cephalosporin use.   [name] Propranolol Itching  . Tape Rash and Other (See Comments)    Does okay with paper tape, but IT CANNOT BE LEFT FOR AN EXTENDED PERIOD OF TIME. EKG LEADS WILL "PULL OFF HER SKIN"    Social History   Social History  . Marital status: Married    Spouse name: N/A  . Number of children: N/A  . Years of education: N/A   Occupational History  . Not on file.   Social History Main Topics  . Smoking status: Former Smoker    Packs/day: 1.00    Types: Cigarettes    Start date: 08/09/1960  . Smokeless tobacco: Never Used  . Alcohol use No  .  Drug use: No  . Sexual activity: Not on file   Other Topics Concern  . Not on file   Social History Narrative  . No narrative on file     Review of Systems: General: negative for chills, fever, night sweats or weight changes.  Cardiovascular: negative for chest pain, dyspnea on exertion, edema, orthopnea, palpitations, paroxysmal nocturnal dyspnea or shortness of breath Dermatological: negative for rash Respiratory: negative for cough or wheezing Urologic: negative for hematuria Abdominal: negative for nausea, vomiting, diarrhea, bright red blood per rectum, melena, or hematemesis Neurologic: negative for visual changes, syncope, or dizziness All other systems reviewed and are otherwise negative except as noted above.    Blood pressure 140/80, height 5' 1.5" (1.562 m), weight 203 lb 12.8 oz (92.4 kg).  General appearance: alert and no distress Neck: no adenopathy, no carotid bruit, no JVD, supple, symmetrical, trachea midline and thyroid not enlarged, symmetric, no tenderness/mass/nodules Lungs: clear to auscultation bilaterally Heart: irregularly irregular rhythm Extremities: extremities normal, atraumatic, no cyanosis or edema Pulses: 2+ and symmetric Skin: Skin  color, texture, turgor normal. No rashes or lesions Neurologic: Alert and oriented X 3, normal strength and tone. Normal symmetric reflexes. Normal coordination and gait  EKG not performed today  ASSESSMENT AND PLAN:   HYPERTENSION, BENIGN History of essential hypertension with blood pressures measured 140/80. She says at home it's typically higher than this. She is recently seen in the emergency room on 06/04/17 with elevated blood pressure which ultimately improved over time. She is on amlodipine and metoprolol. Her renal function is mildly impaired limiting the use of an ace and/or ARB. I'm going to begin low-dose hydralazine and we'll have her keep a blood pressure log and a daily basis for 30 days. She'll see [name] in hypertension clinic in one month for follow-up.  Hyperlipidemia history of hyperlipidemia on statin therapy followed by her PCP  PAF (paroxysmal atrial fibrillation) (HCC) History of atrial fibrillation rate controlled on Eliquis  oral anticoagulation.      Lorretta Harp MD FACP,FACC,FAHA, [facility] 06/22/2017 2:03 PM

## 2017-06-22 NOTE — Assessment & Plan Note (Signed)
History of atrial fibrillation rate controlled on Eliquis oral anticoagulation. 

## 2017-06-22 NOTE — Assessment & Plan Note (Signed)
History of essential hypertension with blood pressures measured 140/80. She says at home it's typically higher than this. She is recently seen in the emergency room on 06/04/17 with elevated blood pressure which ultimately improved over time. She is on amlodipine and metoprolol. Her renal function is mildly impaired limiting the use of an ace and/or ARB. I'm going to begin low-dose hydralazine and we'll have her keep a blood pressure log and a daily basis for 30 days. She'll see [name] in hypertension clinic in one month for follow-up.

## 2017-08-19 ENCOUNTER — Encounter: Payer: Self-pay | Admitting: Neurology

## 2017-08-19 ENCOUNTER — Ambulatory Visit: Payer: Medicare Other | Admitting: Neurology

## 2017-08-19 ENCOUNTER — Encounter (INDEPENDENT_AMBULATORY_CARE_PROVIDER_SITE_OTHER): Payer: Self-pay

## 2017-08-19 VITALS — BP 157/88 | HR 77 | Ht 61.5 in | Wt 202.5 lb

## 2017-08-19 DIAGNOSIS — R443 Hallucinations, unspecified: Secondary | ICD-10-CM

## 2017-08-19 DIAGNOSIS — F09 Unspecified mental disorder due to known physiological condition: Secondary | ICD-10-CM | POA: Diagnosis not present

## 2017-08-19 MED ORDER — DONEPEZIL HCL 10 MG PO TABS: 10.0000 mg | ORAL_TABLET | Freq: Every day | ORAL | 11 refills | Status: DC

## 2017-08-19 NOTE — Progress Notes (Signed)
PATIENT: Amy Hahn DOB: 1936-01-07  Chief Complaint  Patient presents with  . Hilario Quarry Syndrome    She is here with her daughter, Nira Conn and son, Lennette Bihari.  She is here to have her visual hallucinations further evaluated.  She is also having auditory hallucinations.  . Memory Loss    MMSE - animals.  [name] PCP    [name], MD     HISTORICAL  Amy Hahn is a 81 years old female, seen in refer by her primary care doctor [name] evaluation of hallucinations, initial evaluation was on August 19 2017, she is accompanied by her daughter Nira Conn and her son Lennette Bihari at today's clinical visit.  I reviewed and summarized referring note, she has history of hyperlipidemia, hypertension, hypothyroidism, osteoarthritis, retinal hemorrhage, bilateral cataract, glaucoma, right breast cancer, status post lobectomy in 2001, kidney stone, thyroid nodule, 40-60% stenosis at left internal carotid artery,  Around February 2018, she began to have visual hallucinations, at evening time, she often sees spiders at the ceiling, later she had gradual worsening hallucinations, she thought her sister was in bed with her, she also saw people in her backyard,  There was no auditory hallucinations, she tends to have vivid dreams, there was no significant memory loss, there was also personality change, she became more stubborn, less kind, also become less physically active, lost interest in reading, There was no family history of dementia.  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   REVIEW OF SYSTEMS: Full 14 system review of systems performed and notable only for fatigue, chest pain, hearing loss, spinning sensation, blurred vision, loss of vision, shortness of breath, snoring, easy bruising, joint pain, achy muscles, skin sensitivity, memory loss, confusion, dizziness, insomnia, sleepiness, depression,  decreased energy, disinteresting activities, hallucinations,  ALLERGIES: Allergies  Allergen Reactions  . Amitriptyline Other (See Comments)    Weakness   . Edarbi [Azilsartan] Other (See Comments)    Lupus  . Morphine And Related Other (See Comments)    "MAKES ME CRAZY"  . Amlodipine Other (See Comments)    Headache   . Atenolol Other (See Comments)    "decreases blood pressure"  . Codeine Nausea Only  . Doxazosin Other (See Comments)    Leg pain  . Dyazide [Hydrochlorothiazide W-Triamterene] Itching  . Erythromycin Diarrhea and Other (See Comments)    Also cramping  . Labetalol Other (See Comments)    Bradycardia   . Levatol [Penbutolol Sulfate] Swelling    Eye swelling  . Losartan Other (See Comments)    Headache   . Monopril [Fosinopril] Hives  . Contrast Media [Iodinated Diagnostic Agents]   . Ace Inhibitors Cough  . Ampicillin Rash  . Belviq [Lorcaserin Hcl] Itching  . Butrans [Buprenorphine] Rash  . Clotrimazole-Betamethasone Rash  . Elocon [Mometasone Furoate] Rash  . Latex Rash    " IF ON ME FOR OVER 24 HOURS"  . Penicillins Itching and Rash    Has patient had a PCN reaction causing immediate rash, facial/tongue/throat swelling, SOB or lightheadedness with hypotension: Yes Has patient had a PCN reaction causing severe rash involving mucus membranes or skin necrosis: Unknown Has patient had a PCN reaction that required hospitalization: Unknown Has patient had a PCN reaction occurring within the last 10 years: No If all of the above answers are "NO", then may proceed with Cephalosporin use.   [name] Propranolol Itching  . Tape Rash and Other (  See Comments)    Does okay with paper tape, but IT CANNOT BE LEFT FOR AN EXTENDED PERIOD OF TIME. EKG LEADS WILL "PULL OFF HER SKIN"    HOME MEDICATIONS: Current Outpatient Medications  Medication Sig Dispense Refill  . acetaminophen (TYLENOL) 500 MG tablet Take 500 mg by mouth every 6 (six) hours as needed for moderate  pain.     . brimonidine (ALPHAGAN) 0.2 % ophthalmic solution Place 2 drops into the right eye daily.     . cetirizine (ZYRTEC) 10 MG tablet Take 10 mg by mouth daily as needed for allergies.     . Cholecalciferol 5000 units TABS Take 5,000 Units by mouth every evening.    . dorzolamide-timolol (COSOPT) 22.3-6.8 MG/ML ophthalmic solution Place 1 drop into the right eye 2 (two) times daily.     [name] ELIQUIS 5 MG TABS tablet TAKE 1 TABLET TWICE A DAY (Patient taking differently: Take 5 mg by mouth two times a day) 180 tablet 1  . furosemide (LASIX) 40 MG tablet TAKE 1 TABLET BY MOUTH EVERY DAY (Patient taking differently: Take 40 mg by mouth once a day) 90 tablet 3  . hydrALAZINE (APRESOLINE) 25 MG tablet Take 1 tablet (25 mg total) by mouth 3 (three) times daily. 270 tablet 1  . latanoprost (XALATAN) 0.005 % ophthalmic solution Place 1 drop into both eyes at bedtime.    [name] levothyroxine (SYNTHROID, LEVOTHROID) 88 MCG tablet Take 88 mcg by mouth daily before breakfast.    . meclizine (ANTIVERT) 25 MG tablet Take 25 mg by mouth at bedtime.     . metoprolol tartrate (LOPRESSOR) 25 MG tablet Take 1 tablet by mouth twice a day and take 1 tablet daily AS NEEDED FOR PALPITATIONS 270 tablet 3  . metoprolol tartrate (LOPRESSOR) 25 MG tablet TAKE 1 TABLET (25 MG TOTAL) BY MOUTH 2 (TWO) TIMES DAILY. (Patient taking differently: Take 25 mg by mouth 2 (two) times daily. AND MAY TAKE AN ADDITIONAL 25 MG DAILY IF NEEDED FOR PALPITATIONS) 60 tablet 6  . Multiple Vitamin (MULTIVITAMIN WITH MINERALS) TABS tablet Take 1 tablet by mouth daily.    . Multiple Vitamins-Minerals (PRESERVISION AREDS 2) CAPS Take 1 capsule by mouth 2 (two) times daily.    . nitroGLYCERIN (NITROSTAT) 0.4 MG SL tablet Place 1 tablet (0.4 mg total) under the tongue every 5 (five) minutes as needed for chest pain. 25 tablet 6  . omeprazole (PRILOSEC) 20 MG capsule Take 20 mg by mouth at bedtime.     . potassium chloride (KLOR-CON) 20 MEQ packet Take  20 mEq by mouth 2 (two) times daily. 60 packet 6  . prednisoLONE acetate (PRED FORTE) 1 % ophthalmic suspension Place 1 drop into the left eye 2 (two) times daily.    . rosuvastatin (CRESTOR) 20 MG tablet Take 20 mg by mouth every evening.    . traZODone (DESYREL) 50 MG tablet Take 50 mg by mouth at bedtime.    [name] amLODipine (NORVASC) 5 MG tablet Take 1 tablet (5 mg total) by mouth daily. 180 tablet 1  . isosorbide mononitrate (IMDUR) 30 MG 24 hr tablet Take 0.5 tablets (15 mg total) by mouth daily. 45 tablet 1   No current facility-administered medications for this visit.     PAST MEDICAL HISTORY: Past Medical History:  Diagnosis Date  . Anxiety   . Atrial fibrillation ([place])    a. paroxysmal, diagnosed in 08/2016 and started on Eliquis  . Breast cancer ([place])   . Cataracts, bilateral   .  Chronic combined systolic and diastolic CHF (congestive heart failure) ([place])    a. echo 11/2015: EF 45-50%  b. echo 08/2016: EF 35-40%, diffuse HK, akinesis of mid inferoseptal and apical septal myocardium, mild AR, mild MR, LA moderately dilated.  . Complication of anesthesia   . GERD (gastroesophageal reflux disease)   . Glaucoma   . History of kidney stones    HISTORY OF  . Hyperlipidemia   . Hypertension   . Macular degeneration   . Nodule of right lung   . Obesity   . PONV (postoperative nausea and vomiting)   . Sleep apnea   . Visual hallucination     PAST SURGICAL HISTORY: Past Surgical History:  Procedure Laterality Date  . CATARACT EXTRACTION, BILATERAL    . CESAREAN SECTION    . MASTECTOMY Right 1997 ?  [name] TOTAL KNEE ARTHROPLASTY     x2    FAMILY HISTORY: Family History  Problem Relation Age of Onset  . Diabetes Mother   . Macular degeneration Mother   . Heart attack Father   . Cancer Father        Colon and Prostate Cancer  . Diabetes Sister   . Diabetes Brother   . Heart attack Brother   . Diabetes Brother     SOCIAL HISTORY:  Social History   Socioeconomic  History  . Marital status: Married    Spouse name: Not on file  . Number of children: Not on file  . Years of education: GED  . Highest education level: Not on file  Social Needs  . Financial resource strain: Not on file  . Food insecurity - worry: Not on file  . Food insecurity - inability: Not on file  . Transportation needs - medical: Not on file  . Transportation needs - non-medical: Not on file  Occupational History  . Occupation: Retired  Tobacco Use  . Smoking status: Former Smoker    Packs/day: 1.00    Types: Cigarettes    Start date: 08/09/1960  . Smokeless tobacco: Never Used  Substance and Sexual Activity  . Alcohol use: No  . Drug use: No  . Sexual activity: Not on file  Other Topics Concern  . Not on file  Social History Narrative   Lives at home with husband.   Right-handed.   No daily use of caffeine.     PHYSICAL EXAM   Vitals:   08/19/17 1522  BP: (!) 157/88  Pulse: 77  Weight: 202 lb 8 oz (91.9 kg)  Height: 5' 1.5" (1.562 m)    Not recorded      Body mass index is 37.64 kg/m.  PHYSICAL EXAMNIATION:  Gen: NAD, conversant, well nourised, obese, well groomed                     Cardiovascular: Regular rate rhythm, no peripheral edema, warm, nontender. Eyes: Conjunctivae clear without exudates or hemorrhage Neck: Supple, no carotid bruits. Pulmonary: Clear to auscultation bilaterally   NEUROLOGICAL EXAM:  MMSE - Mini Mental State Exam 08/19/2017  Orientation to time 4  Orientation to Place 5  Registration 3  Attention/ Calculation 5  Recall 2  Language- name 2 objects 2  Language- repeat 1  Language- follow 3 step command 3  Language- read & follow direction 1  Write a sentence 1  Copy design 1  Total score 28  animal naming 9   CRANIAL NERVES: CN II: Visual fields are full to confrontation. Fundoscopic exam is  normal with sharp discs and no vascular changes. Pupils are round equal and briskly reactive to light. CN III, IV, VI:  extraocular movement are normal. No ptosis. CN V: Facial sensation is intact to pinprick in all 3 divisions bilaterally. Corneal responses are intact.  CN VII: Face is symmetric with normal eye closure and smile. CN VIII: Hearing is normal to rubbing fingers CN IX, X: Palate elevates symmetrically. Phonation is normal. CN XI: Head turning and shoulder shrug are intact CN XII: Tongue is midline with normal movements and no atrophy.  MOTOR: There is no pronator drift of out-stretched arms. Muscle bulk and tone are normal. Muscle strength is normal.  REFLEXES: Reflexes are 2+ and symmetric at the biceps, triceps, knees, and ankles. Plantar responses are flexor.  SENSORY: Intact to light touch, pinprick, positional sensation and vibratory sensation are intact in fingers and toes.  COORDINATION: Rapid alternating movements and fine finger movements are intact. There is no dysmetria on finger-to-nose and heel-knee-shin.    GAIT/STANCE: Mildly antalgic Romberg is absent.   DIAGNOSTIC DATA (LABS, IMAGING, TESTING) - I reviewed patient records, labs, notes, testing and imaging myself where available.   ASSESSMENT AND PLAN  Amy Hahn is a 81 y.o. female   Visual hallucinations, mild memory loss,   most consistent with central nervous system degenerative disorders, including possible Lewy body dementia  Starting Aricept 10 mg daily  Laboratory evaluations   referred for neuropsychiatric evaluations  Marcial Pacas, M.D. Ph.D.  [facility] [address], [place], [place] [address] Ph: [phone] Fax: [phone]  CC: [name],

## 2017-08-23 ENCOUNTER — Telehealth: Payer: Self-pay | Admitting: Neurology

## 2017-08-23 LAB — IRON AND TIBC
IRON: 47 ug/dL (ref 27–139)
Iron Saturation: 13 % — ABNORMAL LOW (ref 15–55)
Total Iron Binding Capacity: 355 ug/dL (ref 250–450)
UIBC: 308 ug/dL (ref 118–369)

## 2017-08-23 LAB — SEDIMENTATION RATE: Sed Rate: 52 mm/hr — ABNORMAL HIGH (ref 0–40)

## 2017-08-23 LAB — FERRITIN: FERRITIN: 119 ng/mL (ref 15–150)

## 2017-08-23 LAB — TSH: TSH: 3.81 u[IU]/mL (ref 0.450–4.500)

## 2017-08-23 LAB — ANA W/REFLEX: Anti Nuclear Antibody(ANA): NEGATIVE

## 2017-08-23 LAB — FOLATE: Folate: >20 ng/mL (ref >3.0)

## 2017-08-23 LAB — C-REACTIVE PROTEIN: CRP: 5.2 mg/L — ABNORMAL HIGH (ref 0.0–4.9)

## 2017-08-23 LAB — VITAMIN B12: VITAMIN B 12: 471 pg/mL (ref 232–1245)

## 2017-08-23 LAB — VITAMIN D 25 HYDROXY (VIT D DEFICIENCY, FRACTURES): VIT D 25 HYDROXY: 68.8 ng/mL (ref 30.0–100.0)

## 2017-08-23 LAB — RPR: RPR: NONREACTIVE

## 2017-08-23 NOTE — Telephone Encounter (Signed)
Laboratory evaluation showed mild elevated ESR, C-reactive protein has unknown clinical significance,  Rest of the laboratory evaluation showed no significant abnormality,  Continue treatment plan as previously discussed.

## 2017-08-24 NOTE — Telephone Encounter (Signed)
Pt daughter (on [place]) called back, she is asking for a call with the results please

## 2017-08-24 NOTE — Telephone Encounter (Signed)
Spoke to patient - she is aware of results.  I have also called and left a message requesting a return call from her daughter on HIP[employer], Nira Conn.

## 2017-08-24 NOTE — Telephone Encounter (Signed)
Spoke to [employer] - she is aware of results.

## 2017-09-09 ENCOUNTER — Other Ambulatory Visit: Payer: Self-pay | Admitting: Cardiovascular Disease

## 2017-09-14 ENCOUNTER — Encounter: Payer: Self-pay | Admitting: Neurology

## 2017-09-27 DIAGNOSIS — Z87442 Personal history of urinary calculi: Secondary | ICD-10-CM | POA: Insufficient documentation

## 2017-09-27 DIAGNOSIS — R112 Nausea with vomiting, unspecified: Secondary | ICD-10-CM | POA: Insufficient documentation

## 2017-09-27 DIAGNOSIS — R441 Visual hallucinations: Secondary | ICD-10-CM | POA: Insufficient documentation

## 2017-09-27 DIAGNOSIS — H409 Unspecified glaucoma: Secondary | ICD-10-CM | POA: Insufficient documentation

## 2017-09-27 DIAGNOSIS — E669 Obesity, unspecified: Secondary | ICD-10-CM | POA: Insufficient documentation

## 2017-09-27 DIAGNOSIS — K219 Gastro-esophageal reflux disease without esophagitis: Secondary | ICD-10-CM | POA: Insufficient documentation

## 2017-09-27 DIAGNOSIS — I4891 Unspecified atrial fibrillation: Secondary | ICD-10-CM | POA: Insufficient documentation

## 2017-09-27 DIAGNOSIS — T4145XA Adverse effect of unspecified anesthetic, initial encounter: Secondary | ICD-10-CM | POA: Insufficient documentation

## 2017-09-27 DIAGNOSIS — R911 Solitary pulmonary nodule: Secondary | ICD-10-CM | POA: Insufficient documentation

## 2017-09-27 DIAGNOSIS — C50919 Malignant neoplasm of unspecified site of unspecified female breast: Secondary | ICD-10-CM | POA: Insufficient documentation

## 2017-09-27 DIAGNOSIS — Z9889 Other specified postprocedural states: Secondary | ICD-10-CM

## 2017-09-27 DIAGNOSIS — G4730 Sleep apnea, unspecified: Secondary | ICD-10-CM | POA: Insufficient documentation

## 2017-09-27 DIAGNOSIS — F419 Anxiety disorder, unspecified: Secondary | ICD-10-CM | POA: Insufficient documentation

## 2017-09-27 DIAGNOSIS — T8859XA Other complications of anesthesia, initial encounter: Secondary | ICD-10-CM | POA: Insufficient documentation

## 2017-09-27 DIAGNOSIS — H3530 Unspecified macular degeneration: Secondary | ICD-10-CM | POA: Insufficient documentation

## 2017-09-27 DIAGNOSIS — H269 Unspecified cataract: Secondary | ICD-10-CM | POA: Insufficient documentation

## 2017-10-04 ENCOUNTER — Telehealth: Payer: Self-pay

## 2017-10-04 ENCOUNTER — Encounter: Payer: Self-pay | Admitting: Cardiology

## 2017-10-04 ENCOUNTER — Ambulatory Visit: Payer: Medicare Other | Admitting: Cardiology

## 2017-10-04 DIAGNOSIS — Z789 Other specified health status: Secondary | ICD-10-CM | POA: Diagnosis not present

## 2017-10-04 DIAGNOSIS — Z8673 Personal history of transient ischemic attack (TIA), and cerebral infarction without residual deficits: Secondary | ICD-10-CM

## 2017-10-04 DIAGNOSIS — I6522 Occlusion and stenosis of left carotid artery: Secondary | ICD-10-CM | POA: Diagnosis not present

## 2017-10-04 DIAGNOSIS — I482 Chronic atrial fibrillation: Secondary | ICD-10-CM

## 2017-10-04 DIAGNOSIS — I428 Other cardiomyopathies: Secondary | ICD-10-CM

## 2017-10-04 DIAGNOSIS — F09 Unspecified mental disorder due to known physiological condition: Secondary | ICD-10-CM | POA: Diagnosis not present

## 2017-10-04 DIAGNOSIS — I4821 Permanent atrial fibrillation: Secondary | ICD-10-CM

## 2017-10-04 DIAGNOSIS — Z7901 Long term (current) use of anticoagulants: Secondary | ICD-10-CM | POA: Diagnosis not present

## 2017-10-04 DIAGNOSIS — Z0389 Encounter for observation for other suspected diseases and conditions ruled out: Secondary | ICD-10-CM

## 2017-10-04 DIAGNOSIS — I10 Essential (primary) hypertension: Secondary | ICD-10-CM

## 2017-10-04 DIAGNOSIS — IMO0001 Reserved for inherently not codable concepts without codable children: Secondary | ICD-10-CM | POA: Insufficient documentation

## 2017-10-04 MED ORDER — AMLODIPINE BESYLATE 5 MG PO TABS: 5.0000 mg | ORAL_TABLET | Freq: Every day | ORAL | 3 refills | Status: DC

## 2017-10-04 MED ORDER — AMLODIPINE BESYLATE 10 MG PO TABS: 10.0000 mg | ORAL_TABLET | Freq: Every day | ORAL | 3 refills | Status: DC

## 2017-10-04 NOTE — Assessment & Plan Note (Signed)
B/P running a little high- 060 systolic per home cuff. 045 systolic here.

## 2017-10-04 NOTE — Assessment & Plan Note (Signed)
Permanent- diagnosed in 08/2016 and started on Eliquis

## 2017-10-04 NOTE — Addendum Note (Signed)
Addended by: Leland Johns A on: 10/04/2017 03:42 PM   Modules accepted: Orders

## 2017-10-04 NOTE — Assessment & Plan Note (Signed)
89-78% LICA

## 2017-10-04 NOTE — Telephone Encounter (Signed)
Patients daughter called and stated when she got home and reviewed her moms pill tray the Norvasc was discontinued. She wanted to know what we should do since the Norvasc doseage was increased in the office. Spoke with Lurena Joiner and he stated to have the Norvasc rx changed to 5mg  qd. Informed patients daughter and new rx sent to pharmacy. Daughter voiced understanding.

## 2017-10-04 NOTE — Assessment & Plan Note (Signed)
More than 20 medication intolerances and allergies listed in the pt's chart

## 2017-10-04 NOTE — Assessment & Plan Note (Signed)
Small old CVA noted on MRI

## 2017-10-04 NOTE — Progress Notes (Addendum)
10/04/2017 Amy Hahn   06-Sep-1936  [id number]  Primary Physician [name], MD Primary Cardiologist: Dr Gwenlyn Found  HPI:  81 y/o obese female followed by Dr Gwenlyn Found with HTN, AF, and PVD. She lives with her husband. Her daughter accompanied her to the office today for a routine follow up. The pt has CAF with CVR. She tolerates this well. She is on Eliquis. She has HTN which has been labile at times. She has over 20 medication intolerances which makes treatment difficult. She does not watch her diet. Her daughter tells me her B/P at home runs 371 systolic. In the office it  Was 177 and repeat 696 systolic. The pt has occasional palpitations but denies SOB or chest pain. She recently saw Dr Krista Blue for hallucinations, he feels she has early signs of dementia.    Current Outpatient Medications  Medication Sig Dispense Refill  . acetaminophen (TYLENOL) 500 MG tablet Take 500 mg by mouth every 6 (six) hours as needed for moderate pain.     [name] amLODipine (NORVASC) 10 MG tablet Take 1 tablet (10 mg total) by mouth daily. 30 tablet 3  . brimonidine (ALPHAGAN) 0.2 % ophthalmic solution Place 2 drops into the right eye daily.     . cetirizine (ZYRTEC) 10 MG tablet Take 10 mg by mouth daily as needed for allergies.     . Cholecalciferol 5000 units TABS Take 5,000 Units by mouth every evening.    . donepezil (ARICEPT) 10 MG tablet Take 1 tablet (10 mg total) at bedtime by mouth. 30 tablet 11  . dorzolamide-timolol (COSOPT) 22.3-6.8 MG/ML ophthalmic solution Place 1 drop into the right eye 2 (two) times daily.     [name] ELIQUIS 5 MG TABS tablet TAKE 1 TABLET BY MOUTH TWICE A DAY 180 tablet 1  . furosemide (LASIX) 40 MG tablet TAKE 1 TABLET BY MOUTH EVERY DAY (Patient taking differently: Take 40 mg by mouth once a day) 90 tablet 3  . hydrALAZINE (APRESOLINE) 25 MG tablet Take 1 tablet (25 mg total) by mouth 3 (three) times daily. 270 tablet 1  . isosorbide mononitrate (IMDUR) 30 MG 24 hr tablet Take 0.5 tablets (15  mg total) by mouth daily. 45 tablet 1  . latanoprost (XALATAN) 0.005 % ophthalmic solution Place 1 drop into both eyes at bedtime.    [name] levothyroxine (SYNTHROID, LEVOTHROID) 88 MCG tablet Take 88 mcg by mouth daily before breakfast.    . meclizine (ANTIVERT) 25 MG tablet Take 25 mg by mouth at bedtime.     . metoprolol tartrate (LOPRESSOR) 25 MG tablet Take 1 tablet by mouth twice a day and take 1 tablet daily AS NEEDED FOR PALPITATIONS 270 tablet 3  . metoprolol tartrate (LOPRESSOR) 25 MG tablet TAKE 1 TABLET (25 MG TOTAL) BY MOUTH 2 (TWO) TIMES DAILY. (Patient taking differently: Take 25 mg by mouth 2 (two) times daily. AND MAY TAKE AN ADDITIONAL 25 MG DAILY IF NEEDED FOR PALPITATIONS) 60 tablet 6  . Multiple Vitamin (MULTIVITAMIN WITH MINERALS) TABS tablet Take 1 tablet by mouth daily.    . Multiple Vitamins-Minerals (PRESERVISION AREDS 2) CAPS Take 1 capsule by mouth 2 (two) times daily.    . nitroGLYCERIN (NITROSTAT) 0.4 MG SL tablet Place 1 tablet (0.4 mg total) under the tongue every 5 (five) minutes as needed for chest pain. 25 tablet 6  . omeprazole (PRILOSEC) 20 MG capsule Take 20 mg by mouth at bedtime.     . potassium chloride (KLOR-CON) 20 MEQ  packet Take 20 mEq by mouth 2 (two) times daily. 60 packet 6  . prednisoLONE acetate (PRED FORTE) 1 % ophthalmic suspension Place 1 drop into the left eye 2 (two) times daily.    . QUEtiapine (SEROQUEL) 50 MG tablet Take 50 mg by mouth at bedtime.    . rosuvastatin (CRESTOR) 20 MG tablet Take 20 mg by mouth every evening.    . traZODone (DESYREL) 50 MG tablet Take 50 mg by mouth at bedtime.     No current facility-administered medications for this visit.     Allergies  Allergen Reactions  . Amitriptyline Other (See Comments)    Weakness   . Edarbi [Azilsartan] Other (See Comments)    Lupus  . Morphine And Related Other (See Comments)    "MAKES ME CRAZY"  . Amlodipine Other (See Comments)    Headache   . Atenolol Other (See Comments)      "decreases blood pressure"  . Codeine Nausea Only  . Doxazosin Other (See Comments)    Leg pain  . Dyazide [Hydrochlorothiazide W-Triamterene] Itching  . Erythromycin Diarrhea and Other (See Comments)    Also cramping  . Labetalol Other (See Comments)    Bradycardia   . Levatol [Penbutolol Sulfate] Swelling    Eye swelling  . Losartan Other (See Comments)    Headache   . Monopril [Fosinopril] Hives  . Contrast Media [Iodinated Diagnostic Agents]   . Ace Inhibitors Cough  . Ampicillin Rash  . Belviq [Lorcaserin Hcl] Itching  . Butrans [Buprenorphine] Rash  . Clotrimazole-Betamethasone Rash  . Elocon [Mometasone Furoate] Rash  . Latex Rash    " IF ON ME FOR OVER 24 HOURS"  . Penicillins Itching and Rash    Has patient had a PCN reaction causing immediate rash, facial/tongue/throat swelling, SOB or lightheadedness with hypotension: Yes Has patient had a PCN reaction causing severe rash involving mucus membranes or skin necrosis: Unknown Has patient had a PCN reaction that required hospitalization: Unknown Has patient had a PCN reaction occurring within the last 10 years: No If all of the above answers are "NO", then may proceed with Cephalosporin use.   [name] Propranolol Itching  . Tape Rash and Other (See Comments)    Does okay with paper tape, but IT CANNOT BE LEFT FOR AN EXTENDED PERIOD OF TIME. EKG LEADS WILL "PULL OFF HER SKIN"    Past Medical History:  Diagnosis Date  . Anxiety   . Atrial fibrillation ([place])    a. paroxysmal, diagnosed in 08/2016 and started on Eliquis  . Breast cancer ([place])   . Cataracts, bilateral   . Chronic combined systolic and diastolic CHF (congestive heart failure) ([place])    a. echo 11/2015: EF 45-50%  b. echo 08/2016: EF 35-40%, diffuse HK, akinesis of mid inferoseptal and apical septal myocardium, mild AR, mild MR, LA moderately dilated.  . Complication of anesthesia   . GERD (gastroesophageal reflux disease)   . Glaucoma   . History of  kidney stones    HISTORY OF  . Hyperlipidemia   . Hypertension   . Macular degeneration   . Nodule of right lung   . Obesity   . PONV (postoperative nausea and vomiting)   . Sleep apnea   . Visual hallucination     Social History   Socioeconomic History  . Marital status: Married    Spouse name: Not on file  . Number of children: Not on file  . Years of education: GED  . Highest  education level: Not on file  Social Needs  . Financial resource strain: Not on file  . Food insecurity - worry: Not on file  . Food insecurity - inability: Not on file  . Transportation needs - medical: Not on file  . Transportation needs - non-medical: Not on file  Occupational History  . Occupation: Retired  Tobacco Use  . Smoking status: Former Smoker    Packs/day: 1.00    Types: Cigarettes    Start date: 08/09/1960  . Smokeless tobacco: Never Used  Substance and Sexual Activity  . Alcohol use: No  . Drug use: No  . Sexual activity: Not on file  Other Topics Concern  . Not on file  Social History Narrative   Lives at home with husband.   Right-handed.   No daily use of caffeine.     Family History  Problem Relation Age of Onset  . Diabetes Mother   . Macular degeneration Mother   . Heart attack Father   . Cancer Father        Colon and Prostate Cancer  . Diabetes Sister   . Diabetes Brother   . Heart attack Brother   . Diabetes Brother      Review of Systems: General: negative for chills, fever, night sweats or weight changes.  Cardiovascular: negative for chest pain, dyspnea on exertion, edema, orthopnea, palpitations, paroxysmal nocturnal dyspnea or shortness of breath Dermatological: negative for rash Respiratory: negative for cough or wheezing Urologic: negative for hematuria Abdominal: negative for nausea, vomiting, diarrhea, bright red blood per rectum, melena, or hematemesis Neurologic: negative for visual changes, syncope, or dizziness All other systems reviewed  and are otherwise negative except as noted above.    Blood pressure (!) 177/82, pulse 62, height 5' 1.5" (1.562 m), weight 199 lb 12.8 oz (90.6 kg).  General appearance: alert, cooperative, no distress and morbidly obese Neck: no carotid bruit and no JVD Lungs: clear to auscultation bilaterally Heart: irregularly irregular rhythm Extremities: extremities normal, atraumatic, no cyanosis or edema Skin: Skin color, texture, turgor normal. No rashes or lesions Neurologic: Grossly normal  EKG AF with VR 70  ASSESSMENT AND PLAN:   Essential hypertension B/P running a little high- 754 systolic per home cuff. 492 systolic here.  Atrial fibrillation ([place]) Permanent- diagnosed in 08/2016 and started on Eliquis  Mild cognitive disorder Followed by Dr Krista Blue  NICM (nonischemic cardiomyopathy) [facility]) EF 35-40% by echo in Nov 2017 No CHF on exam or by recent history  Normal coronary arteries June 2018- Dr Doylene Canard  History of CVA (cerebrovascular accident) Small old CVA noted on MRI  Chronic anticoagulation CHADS VASC= 7. She is on Eliquis 5 mg BID  Carotid stenosis 01-00% LICA  Medication intolerance More than 20 medication intolerances and allergies listed in the pt's chart   PLAN  I suggested they try and increase Amy Hahn's Norvasc to 10 mg. F/U with Dr Gwenlyn Found in 6 months.   Kerin Ransom PA-C 10/04/2017 2:29 PM   Pt's daughter called back. She says her mother is not on Amlodipine though it's listed on her med rec. I suggested they start 5 mg daily.   Kerin Ransom PA-C 10/04/2017 3:42 PM

## 2017-10-04 NOTE — Assessment & Plan Note (Signed)
EF 35-40% by echo in Nov 2017 No CHF on exam or by recent history

## 2017-10-04 NOTE — Assessment & Plan Note (Signed)
June 2018- Dr Doylene Canard

## 2017-10-04 NOTE — Assessment & Plan Note (Signed)
CHADS VASC= 7. She is on Eliquis 5 mg BID

## 2017-10-04 NOTE — Assessment & Plan Note (Signed)
Followed by Dr Yan 

## 2017-10-04 NOTE — Patient Instructions (Signed)
Medication Instructions:  INCREASE Norvasc to 10 mg take 1 tablet once a day    Labwork: None   Testing/Procedures: None   Follow-Up: Your physician wants you to follow-up in: 6 months with Dr Gwenlyn Found.  You will receive a reminder letter in the mail two months in advance. If you don't receive a letter, please call our office to schedule the follow-up appointment.  Any Other Special Instructions Will Be Listed Below (If Applicable).  If you need a refill on your cardiac medications before your next appointment, please call your pharmacy.

## 2017-10-12 ENCOUNTER — Encounter: Payer: Self-pay | Admitting: Neurology

## 2017-11-05 DEATH — deceased

## 2017-11-18 ENCOUNTER — Ambulatory Visit: Payer: Medicare Other | Admitting: Neurology

## 2017-11-18 ENCOUNTER — Encounter: Payer: Self-pay | Admitting: Neurology

## 2017-11-18 VITALS — BP 134/68 | HR 54 | Ht 61.5 in | Wt 199.2 lb

## 2017-11-18 DIAGNOSIS — F09 Unspecified mental disorder due to known physiological condition: Secondary | ICD-10-CM | POA: Diagnosis not present

## 2017-11-18 DIAGNOSIS — R269 Unspecified abnormalities of gait and mobility: Secondary | ICD-10-CM

## 2017-11-18 DIAGNOSIS — R441 Visual hallucinations: Secondary | ICD-10-CM | POA: Diagnosis not present

## 2017-11-18 NOTE — Progress Notes (Signed)
PATIENT: Amy Hahn DOB: 1935/12/22  Chief Complaint  Patient presents with  . Mild Cognitive Disorder    She is here with her husband, Linton Rump.  Her memory is unchanged (last MMSE 28/30 on 08/19/17).  Dr. Georges Mouse office canceled her neuropsychiatric testing due to an insurance issue.  She needs a referral to a different practice.  She has continued taking donepezil 10mg  daily but has concerns over persistent diarrhea that will not resolve.     HISTORICAL  Amy Hahn is a 82 years old female, seen in refer by her primary care doctor [name] evaluation of hallucinations, initial evaluation was on August 19 2017, she is accompanied by her daughter Nira Conn and her son Lennette Bihari at today's clinical visit.  I reviewed and summarized referring note, she has history of hyperlipidemia, hypertension, hypothyroidism, osteoarthritis, retinal hemorrhage, bilateral cataract, glaucoma, right breast cancer, status post lobectomy in 2001, kidney stone, thyroid nodule, 40-60% stenosis at left internal carotid artery,  Around February 2018, she began to have visual hallucinations, at evening time, she often sees spiders at the ceiling, later she had gradual worsening hallucinations, she thought her sister was in bed with her, she also saw people in her backyard,  There was no auditory hallucinations, she tends to have vivid dreams, there was no significant memory loss, there was also personality change, she became more stubborn, less kind, also become less physically active, lost interest in reading, There was no family history of dementia.  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  Mild generalized atrophy.  UPDATE Nov 18 2017: Laboratory evaluations in January 2019, CMP, creatinine of 1.16, potassium of 3.1, previous was 2.7, A1c was 6.1, LDL was 37, cholesterol was 106, vitamin D level was 56 normal  CBC,Hg 11.6.  She has frequent diarrhea, is very sedentary, deconditioned  REVIEW OF SYSTEMS: Full 14 system review of systems performed and notable only for as above ALLERGIES: Allergies  Allergen Reactions  . Amitriptyline Other (See Comments)    Weakness   . Edarbi [Azilsartan] Other (See Comments)    Lupus  . Morphine And Related Other (See Comments)    "MAKES ME CRAZY"  . Amlodipine Other (See Comments)    Headache   . Atenolol Other (See Comments)    "decreases blood pressure"  . Codeine Nausea Only  . Doxazosin Other (See Comments)    Leg pain  . Dyazide [Hydrochlorothiazide W-Triamterene] Itching  . Erythromycin Diarrhea and Other (See Comments)    Also cramping  . Labetalol Other (See Comments)    Bradycardia   . Levatol [Penbutolol Sulfate] Swelling    Eye swelling  . Losartan Other (See Comments)    Headache   . Monopril [Fosinopril] Hives  . Contrast Media [Iodinated Diagnostic Agents]   . Ace Inhibitors Cough  . Ampicillin Rash  . Belviq [Lorcaserin Hcl] Itching  . Butrans [Buprenorphine] Rash  . Clotrimazole-Betamethasone Rash  . Elocon [Mometasone Furoate] Rash  . Latex Rash    " IF ON ME FOR OVER 24 HOURS"  . Penicillins Itching and Rash    Has patient had a PCN reaction causing immediate rash, facial/tongue/throat swelling, SOB or lightheadedness with hypotension: Yes Has patient had a PCN reaction causing severe rash involving mucus membranes or skin necrosis: Unknown Has patient had a PCN reaction that required hospitalization: Unknown Has patient had a PCN reaction occurring within the last 10 years: No If  all of the above answers are "NO", then may proceed with Cephalosporin use.   [name] Propranolol Itching  . Tape Rash and Other (See Comments)    Does okay with paper tape, but IT CANNOT BE LEFT FOR AN EXTENDED PERIOD OF TIME. EKG LEADS WILL "PULL OFF HER SKIN"    HOME MEDICATIONS: Current Outpatient Medications  Medication Sig Dispense  Refill  . acetaminophen (TYLENOL) 500 MG tablet Take 500 mg by mouth every 6 (six) hours as needed for moderate pain.     [name] amLODipine (NORVASC) 5 MG tablet Take 1 tablet (5 mg total) by mouth daily. 30 tablet 3  . brimonidine (ALPHAGAN) 0.2 % ophthalmic solution Place 2 drops into the right eye daily.     . cetirizine (ZYRTEC) 10 MG tablet Take 10 mg by mouth daily as needed for allergies.     . Cholecalciferol 5000 units TABS Take 5,000 Units by mouth every evening.    . donepezil (ARICEPT) 10 MG tablet Take 1 tablet (10 mg total) at bedtime by mouth. 30 tablet 11  . dorzolamide-timolol (COSOPT) 22.3-6.8 MG/ML ophthalmic solution Place 1 drop into the right eye 2 (two) times daily.     [name] ELIQUIS 5 MG TABS tablet TAKE 1 TABLET BY MOUTH TWICE A DAY 180 tablet 1  . furosemide (LASIX) 40 MG tablet TAKE 1 TABLET BY MOUTH EVERY DAY (Patient taking differently: Take 40 mg by mouth once a day) 90 tablet 3  . latanoprost (XALATAN) 0.005 % ophthalmic solution Place 1 drop into both eyes at bedtime.    [name] levothyroxine (SYNTHROID, LEVOTHROID) 88 MCG tablet Take 88 mcg by mouth daily before breakfast.    . meclizine (ANTIVERT) 25 MG tablet Take 25 mg by mouth at bedtime.     . metoprolol tartrate (LOPRESSOR) 25 MG tablet Take 1 tablet by mouth twice a day and take 1 tablet daily AS NEEDED FOR PALPITATIONS 270 tablet 3  . metoprolol tartrate (LOPRESSOR) 25 MG tablet TAKE 1 TABLET (25 MG TOTAL) BY MOUTH 2 (TWO) TIMES DAILY. (Patient taking differently: Take 25 mg by mouth 2 (two) times daily. AND MAY TAKE AN ADDITIONAL 25 MG DAILY IF NEEDED FOR PALPITATIONS) 60 tablet 6  . Multiple Vitamin (MULTIVITAMIN WITH MINERALS) TABS tablet Take 1 tablet by mouth daily.    . Multiple Vitamins-Minerals (PRESERVISION AREDS 2) CAPS Take 1 capsule by mouth 2 (two) times daily.    . nitroGLYCERIN (NITROSTAT) 0.4 MG SL tablet Place 1 tablet (0.4 mg total) under the tongue every 5 (five) minutes as needed for chest pain. 25  tablet 6  . omeprazole (PRILOSEC) 20 MG capsule Take 20 mg by mouth at bedtime.     . potassium chloride (KLOR-CON) 20 MEQ packet Take 20 mEq by mouth 2 (two) times daily. 60 packet 6  . prednisoLONE acetate (PRED FORTE) 1 % ophthalmic suspension Place 1 drop into the left eye 2 (two) times daily.    . QUEtiapine (SEROQUEL) 50 MG tablet Take 50 mg by mouth at bedtime.    . rosuvastatin (CRESTOR) 20 MG tablet Take 20 mg by mouth every evening.    . traZODone (DESYREL) 50 MG tablet Take 50 mg by mouth at bedtime.    . hydrALAZINE (APRESOLINE) 25 MG tablet Take 1 tablet (25 mg total) by mouth 3 (three) times daily. 270 tablet 1  . isosorbide mononitrate (IMDUR) 30 MG 24 hr tablet Take 0.5 tablets (15 mg total) by mouth daily. 45 tablet 1   No  current facility-administered medications for this visit.     PAST MEDICAL HISTORY: Past Medical History:  Diagnosis Date  . Anxiety   . Atrial fibrillation ([place])    a. paroxysmal, diagnosed in 08/2016 and started on Eliquis  . Breast cancer ([place])   . Cataracts, bilateral   . Chronic combined systolic and diastolic CHF (congestive heart failure) ([place])    a. echo 11/2015: EF 45-50%  b. echo 08/2016: EF 35-40%, diffuse HK, akinesis of mid inferoseptal and apical septal myocardium, mild AR, mild MR, LA moderately dilated.  . Complication of anesthesia   . GERD (gastroesophageal reflux disease)   . Glaucoma   . History of kidney stones    HISTORY OF  . Hyperlipidemia   . Hypertension   . Macular degeneration   . Nodule of right lung   . Obesity   . PONV (postoperative nausea and vomiting)   . Sleep apnea   . Visual hallucination     PAST SURGICAL HISTORY: Past Surgical History:  Procedure Laterality Date  . CATARACT EXTRACTION, BILATERAL    . CESAREAN SECTION    . MASTECTOMY Right 1997 ?  [name] TOTAL KNEE ARTHROPLASTY     x2    FAMILY HISTORY: Family History  Problem Relation Age of Onset  . Diabetes Mother   . Macular degeneration  Mother   . Heart attack Father   . Cancer Father        Colon and Prostate Cancer  . Diabetes Sister   . Diabetes Brother   . Heart attack Brother   . Diabetes Brother     SOCIAL HISTORY:  Social History   Socioeconomic History  . Marital status: Married    Spouse name: Not on file  . Number of children: Not on file  . Years of education: GED  . Highest education level: Not on file  Social Needs  . Financial resource strain: Not on file  . Food insecurity - worry: Not on file  . Food insecurity - inability: Not on file  . Transportation needs - medical: Not on file  . Transportation needs - non-medical: Not on file  Occupational History  . Occupation: Retired  Tobacco Use  . Smoking status: Former Smoker    Packs/day: 1.00    Types: Cigarettes    Start date: 08/09/1960  . Smokeless tobacco: Never Used  Substance and Sexual Activity  . Alcohol use: No  . Drug use: No  . Sexual activity: Not on file  Other Topics Concern  . Not on file  Social History Narrative   Lives at home with husband.   Right-handed.   No daily use of caffeine.     PHYSICAL EXAM   Vitals:   11/18/17 1137  BP: 134/68  Pulse: (!) 54  Weight: 199 lb 4 oz (90.4 kg)  Height: 5' 1.5" (1.562 m)    Not recorded      Body mass index is 37.04 kg/m.  PHYSICAL EXAMNIATION:  Gen: NAD, conversant, well nourised, obese, well groomed                     Cardiovascular: Regular rate rhythm, no peripheral edema, warm, nontender. Eyes: Conjunctivae clear without exudates or hemorrhage Neck: Supple, no carotid bruits. Pulmonary: Clear to auscultation bilaterally   NEUROLOGICAL EXAM:  MMSE - Mini Mental State Exam 08/19/2017  Orientation to time 4  Orientation to Place 5  Registration 3  Attention/ Calculation 5  Recall 2  Language- name  2 objects 2  Language- repeat 1  Language- follow 3 step command 3  Language- read & follow direction 1  Write a sentence 1  Copy design 1  Total  score 28  animal naming 9   CRANIAL NERVES: CN II: Visual fields are full to confrontation. Fundoscopic exam is normal with sharp discs and no vascular changes. Pupils are round equal and briskly reactive to light. CN III, IV, VI: extraocular movement are normal. No ptosis. CN V: Facial sensation is intact to pinprick in all 3 divisions bilaterally. Corneal responses are intact.  CN VII: Face is symmetric with normal eye closure and smile. CN VIII: Hearing is normal to rubbing fingers CN IX, X: Palate elevates symmetrically. Phonation is normal. CN XI: Head turning and shoulder shrug are intact CN XII: Tongue is midline with normal movements and no atrophy.  MOTOR: There is no pronator drift of out-stretched arms. Muscle bulk and tone are normal. Muscle strength is normal.  REFLEXES: Reflexes are 2+ and symmetric at the biceps, triceps, knees, and ankles. Plantar responses are flexor.  SENSORY: Intact to light touch, pinprick, positional sensation and vibratory sensation are intact in fingers and toes.  COORDINATION: Rapid alternating movements and fine finger movements are intact. There is no dysmetria on finger-to-nose and heel-knee-shin.    GAIT/STANCE: Mildly antalgic Romberg is absent.   DIAGNOSTIC DATA (LABS, IMAGING, TESTING) - I reviewed patient records, labs, notes, testing and imaging myself where available.   ASSESSMENT AND PLAN  Amy Hahn is a 82 y.o. female   Visual hallucinations, mild memory loss,   most consistent with central nervous system degenerative disorders, including possible Lewy body dementia  She is severely deconditioned,  Stop Aricept 10 mg daily, complains of frequent diarrhea, Aricept might contribute,  Referred to neuropsychiatric evaluations Gait abnormality  Refer her to physical therapy  Marcial Pacas, M.D. Ph.D.  [facility] [address] [place], [place] [address] Ph: [phone] Fax:  [phone]  CC: [name],

## 2017-11-25 ENCOUNTER — Encounter: Payer: Self-pay | Admitting: Physical Therapy

## 2017-11-25 ENCOUNTER — Ambulatory Visit: Payer: Medicare Other | Attending: Neurology | Admitting: Physical Therapy

## 2017-11-25 DIAGNOSIS — R262 Difficulty in walking, not elsewhere classified: Secondary | ICD-10-CM | POA: Diagnosis present

## 2017-11-25 DIAGNOSIS — R2681 Unsteadiness on feet: Secondary | ICD-10-CM | POA: Insufficient documentation

## 2017-11-25 DIAGNOSIS — M6281 Muscle weakness (generalized): Secondary | ICD-10-CM

## 2017-11-25 NOTE — Therapy (Signed)
[facility], [place], [address] Phone: [phone]   Fax:  [phone]  Physical Therapy Evaluation  Patient Details  Name: Amy Hahn MRN: [id number] Date of Birth: Jun 28, 1936 Referring Provider: Marcial Pacas   Encounter Date: 11/25/2017  PT End of Session - 11/25/17 1447    Visit Number  1    Number of Visits  8    Date for PT Re-Evaluation  12/30/17    PT Start Time  5974    PT Stop Time  1438    PT Time Calculation (min)  53 min    Activity Tolerance  Patient tolerated treatment well;Other (comment) Fear of falling with activities    Behavior During Therapy  [facility] for tasks assessed/performed       Past Medical History:  Diagnosis Date  . Anxiety   . Atrial fibrillation ([place])    a. paroxysmal, diagnosed in 08/2016 and started on Eliquis  . Breast cancer ([place])   . Cataracts, bilateral   . Chronic combined systolic and diastolic CHF (congestive heart failure) ([place])    a. echo 11/2015: EF 45-50%  b. echo 08/2016: EF 35-40%, diffuse HK, akinesis of mid inferoseptal and apical septal myocardium, mild AR, mild MR, LA moderately dilated.  . Complication of anesthesia   . GERD (gastroesophageal reflux disease)   . Glaucoma   . History of kidney stones    HISTORY OF  . Hyperlipidemia   . Hypertension   . Macular degeneration   . Nodule of right lung   . Obesity   . PONV (postoperative nausea and vomiting)   . Sleep apnea   . Visual hallucination     Past Surgical History:  Procedure Laterality Date  . CATARACT EXTRACTION, BILATERAL    . CESAREAN SECTION    . MASTECTOMY Right 1997 ?  [name] TOTAL KNEE ARTHROPLASTY     x2    There were no vitals filed for this visit.   Subjective Assessment - 11/25/17 2033    Subjective  Patient arrives with husband, Linton Rump, with complaints of unsteadiness on feet, fear of falling, and difficulty walking. Patient stated it she had received home therapy and completed  exercises in December. Patient stated she performs HEP from home physical therapy; however, patient's husband stated she does not. Patient's husband reported patient spends most of her time sitting with little to no activity during the day. Patient stated she does not ambulate within community because she fears she will create pain in her knees but states she is confident she can do it if she needs to. Patient also stated she fatigues quickly and gets short of breath during ambulation. Patient reports an average walking time is 20 minutes while the husband reports 5 minutes. Patient's goals is to improve endurance, improve movement, improve strength and decrease difficulty with home activities.     Patient is accompained by:  Family member Husband    Pertinent History  Cognitive deficits, HTN, CHF, AFib, visual impairments, hallucinations.    Limitations  House hold activities;Standing;Walking    How long can you sit comfortably?  unlimited    How long can you stand comfortably?  5-20 minutes    How long can you walk comfortably?  5-20 minutes    Diagnostic tests  MRI    Patient Stated Goals  improve movement, get more stable         [facility] PT Assessment - 11/25/17 0001      Assessment  Medical Diagnosis  Gait abnormality    Referring Provider  Marcial Pacas    Onset Date/Surgical Date  -- ongoing    Next MD Visit  6 months    Prior Therapy  home therapy      Precautions   Precautions  Fall      Balance Screen   Has the patient fallen in the past 6 months  Yes    How many times?  1    Has the patient had a decrease in activity level because of a fear of falling?   Yes    Is the patient reluctant to leave their home because of a fear of falling?   Yes      [place]  Private residence    Living Arrangements  Spouse/significant other    [place] to enter    Entrance Stairs-Number of Steps  2    Entrance Stairs-Rails  None back steps      Prior  Function   Level of Independence  Independent with household mobility with device      Cognition   Overall Cognitive Status  History of cognitive impairments - at baseline      ROM / Strength   AROM / PROM / Strength  Strength      Strength   Overall Strength  Within functional limits for tasks performed    Overall Strength Comments  5x sit to stand: 29 seconds      Transfers   Transfers  Sit to Stand;Stand to Sit    Sit to Stand  6: Modified independent (Device/Increase time);5: Supervision;With upper extremity assist    Stand to Sit  5: Supervision;6: Modified independent (Device/Increase time);With upper extremity assist      Ambulation/Gait   Ambulation/Gait  Yes    Ambulation/Gait Assistance  6: Modified independent (Device/Increase time)    Assistive device  Straight cane    Gait Pattern  Step-through pattern;Decreased step length - right;Decreased step length - left;Decreased stride length;Wide base of support      Balance   Balance Assessed  Yes      Standardized Balance Assessment   Standardized Balance Assessment  Berg Balance Test      Berg Balance Test   Sit to Stand  Able to stand  independently using hands    Standing Unsupported  Able to stand 2 minutes with supervision    Sitting with Back Unsupported but Feet Supported on Floor or Stool  Able to sit 2 minutes under supervision    Stand to Sit  Uses backs of legs against chair to control descent    Transfers  Able to transfer with verbal cueing and /or supervision    Standing Unsupported with Eyes Closed  Able to stand 10 seconds with supervision    Standing Ubsupported with Feet Together  Needs help to attain position but able to stand for 30 seconds with feet together    From Standing, Reach Forward with Outstretched Arm  Can reach forward >12 cm safely (5")    From Standing Position, Pick up Object from Floor  Unable to pick up and needs supervision    From Standing Position, Turn to Look Behind Over each  Shoulder  Turn sideways only but maintains balance    Turn 360 Degrees  Needs close supervision or verbal cueing    Standing Unsupported, Alternately Place Feet on Step/Stool  Needs assistance to keep from falling or unable to try  Standing Unsupported, One Foot in [employer] balance while stepping or standing    Standing on One Leg  Unable to try or needs assist to prevent fall    Total Score  24             Objective measurements completed on examination: See above findings.              PT Education - 11/25/17 2038    Education provided  Yes    Education Details  Seated and standing marches, heel raises    Person(s) Educated  Spouse;Patient    Methods  Explanation;Demonstration;Handout    Comprehension  Verbalized understanding;Returned demonstration          PT Long Term Goals - 11/25/17 1451      PT LONG TERM GOAL #1   Title  Patient will be independent with HEP.    Time  4    Period  Weeks    Status  New    Target Date  12/23/17      PT LONG TERM GOAL #2   Title  Patient will improve LE strength as seen by ability to perform 5 sit to stands with no UE support in 20 seconds or less.      PT LONG TERM GOAL #3   Title  Patient will improve Berg Balance Scale to 35/56 to decrease risk of falls    Time  4    Period  Weeks    Status  New    Target Date  12/23/17      PT LONG TERM GOAL #4   Title  Patient will negotiate 3 steps with cane with step over step pattern with SPC and no railing to safely enter and exit home.    Time  4    Period  Weeks    Status  New    Target Date  12/23/17             Plan - 11/25/17 1449    Clinical Impression Statement  Patient is a 82 year old female who presents to physical therapy with gait instability and decreased balance and risk of falls. Patient is a poor historian secondary to cognitive deficits and is inconsistent with information, but husband was present to provide accurate information regarding  the patient. Patient ambulates with a straight cane, with lateral drifting, decreased bilateral step length, step height and increased bilateral knee flexion at stance phase. Patient's Berg Balance scale of 24/56 indicates she is a high risk of falls. Patient was fearful of performing activities that may make her fall and many times she required motivation to try activity of Berg Balance Scale before quitting. Patient was able to perform 5x sit to stand test in 29 seconds indicating a fall risk and decreased LE strength. Patient would benefit from skilled physical therapy to improve endurance, improve ambulation, improve balance, and decrease the risk of falls.    History and Personal Factors relevant to plan of care:  Cognitive deficits, HTN, CHF, AFib, Anxiety, Hallucinations    Clinical Presentation  Evolving    Clinical Decision Making  High    Rehab Potential  Fair    PT Frequency  2x / week    PT Duration  4 weeks    PT Treatment/Interventions  ADLs/Self Care Home Management;Balance training;Neuromuscular re-education;Gait training;Stair training;Therapeutic exercise;Therapeutic activities;Functional mobility training    PT Next Visit Plan  Please assess blood pressure before session. Begin nustep, perform seated and standing  balance activities; gait training; LE strengthening    Consulted and Agree with Plan of Care  Patient       Patient will benefit from skilled therapeutic intervention in order to improve the following deficits and impairments:  Abnormal gait, Decreased activity tolerance, Decreased endurance, Decreased strength, Postural dysfunction, Difficulty walking  Visit Diagnosis: Unsteadiness on feet  Muscle weakness (generalized)  Difficulty in walking, not elsewhere classified     Problem List Patient Active Problem List   Diagnosis Date Noted  . NICM (nonischemic cardiomyopathy) ([place]) 10/04/2017  . Normal coronary arteries 10/04/2017  . History of CVA  (cerebrovascular accident) 10/04/2017  . Chronic anticoagulation 10/04/2017  . Medication intolerance 10/04/2017  . Visual hallucination   . Sleep apnea   . PONV (postoperative nausea and vomiting)   . Obesity   . Nodule of right lung   . Macular degeneration   . History of kidney stones   . Glaucoma   . GERD (gastroesophageal reflux disease)   . Complication of anesthesia   . Cataracts, bilateral   . Breast cancer ([place])   . Atrial fibrillation ([place])   . Anxiety   . Hallucination 08/19/2017  . Mild cognitive disorder 08/19/2017  . Chronic combined systolic and diastolic CHF (congestive heart failure) ([place]) 09/30/2016  . Essential hypertension 09/30/2016  . Acute systolic heart failure ([place]) 08/13/2016  . Gait abnormality 07/01/2016  . OSA on CPAP 07/01/2016  . Slurred speech 07/01/2016  . Hyperlipidemia 09/23/2015  . Lung nodule, solitary 07/12/2012  . Breast cancer, right breast ([place]) 07/11/2012  . Carotid stenosis 06/19/2012  . Bradycardia 06/02/2012  . Dizziness 04/27/2012  . Chest pain 04/27/2012  . Palpitations 04/27/2012  . Hypothyroidism 10/17/2008   Gabriela Eves, PT, DPT 11/25/2017, 9:01 PM  [facility] [place], [place], [address] Phone: [phone]   Fax:  [phone]  Name: Amy Hahn MRN: [id number] Date of Birth: [birth year]

## 2017-11-25 NOTE — Patient Instructions (Signed)
   Eben Choinski, PT, DPT [facility] [address] [place], [place], [address] Phone: [phone]   Fax:  [phone]  

## 2017-11-29 ENCOUNTER — Encounter: Payer: Self-pay | Admitting: Physical Therapy

## 2017-11-29 ENCOUNTER — Ambulatory Visit: Payer: Medicare Other | Admitting: Physical Therapy

## 2017-11-29 DIAGNOSIS — R262 Difficulty in walking, not elsewhere classified: Secondary | ICD-10-CM

## 2017-11-29 DIAGNOSIS — R2681 Unsteadiness on feet: Secondary | ICD-10-CM

## 2017-11-29 DIAGNOSIS — M6281 Muscle weakness (generalized): Secondary | ICD-10-CM

## 2017-11-29 NOTE — Therapy (Signed)
[facility] [place], [place], [address] Phone: [phone]   Fax:  [phone]  Physical Therapy Treatment  Patient Details  Name: Amy Hahn MRN: [id number] Date of Birth: Oct 24, 1935 Referring Provider: Marcial Pacas   Encounter Date: 11/29/2017  PT End of Session - 11/29/17 1504    Visit Number  2    Number of Visits  8    Date for PT Re-Evaluation  12/30/17    PT Start Time  6433    PT Stop Time  1511    PT Time Calculation (min)  40 min    Activity Tolerance  Patient tolerated treatment well    Behavior During Therapy  [facility] for tasks assessed/performed       Past Medical History:  Diagnosis Date  . Anxiety   . Atrial fibrillation ([place])    a. paroxysmal, diagnosed in 08/2016 and started on Eliquis  . Breast cancer ([place])   . Cataracts, bilateral   . Chronic combined systolic and diastolic CHF (congestive heart failure) ([place])    a. echo 11/2015: EF 45-50%  b. echo 08/2016: EF 35-40%, diffuse HK, akinesis of mid inferoseptal and apical septal myocardium, mild AR, mild MR, LA moderately dilated.  . Complication of anesthesia   . GERD (gastroesophageal reflux disease)   . Glaucoma   . History of kidney stones    HISTORY OF  . Hyperlipidemia   . Hypertension   . Macular degeneration   . Nodule of right lung   . Obesity   . PONV (postoperative nausea and vomiting)   . Sleep apnea   . Visual hallucination     Past Surgical History:  Procedure Laterality Date  . CATARACT EXTRACTION, BILATERAL    . CESAREAN SECTION    . MASTECTOMY Right 1997 ?  [name] TOTAL KNEE ARTHROPLASTY     x2    There were no vitals filed for this visit.  Subjective Assessment - 11/29/17 1439    Subjective  Patient arrived with no complaints and has reported doing some exercises as home per instructed by PT, normal vitals upon arrivel    Pertinent History  Cognitive deficits, HTN, CHF, AFib, visual impairments, hallucinations.    Limitations  House hold activities;Standing;Walking    How long can you sit comfortably?  unlimited    How long can you stand comfortably?  5-20 minutes    How long can you walk comfortably?  5-20 minutes    Diagnostic tests  MRI    Patient Stated Goals  improve movement, get more stable    Currently in Pain?  No/denies                      [facility] Adult PT Treatment/Exercise - 11/29/17 0001      Exercises   Exercises  Lumbar;Knee/Hip      Lumbar Exercises: Aerobic   Nustep  L3 x55min UE/LE activity, monitored for progression      Lumbar Exercises: Seated   Other Seated Lumbar Exercises  seated core activation with ball reachouts 2x10      Knee/Hip Exercises: Seated   Long Arc Quad  Strengthening;Both;2 sets;10 reps;Weights;Limitations    Long Arc Quad Weight  2 lbs.    Ball Squeeze  x20 with grey ball    Other Seated Knee/Hip Exercises  clam with red t-band x20    Marching  Strengthening;Both;2 sets;10 reps    Marching Weights  2 lbs.  Balance Exercises - 11/29/17 1454      Balance Exercises: Standing   Standing Eyes Opened  Wide (BOA);Narrow base of support (BOS);Solid surface;4 reps    Standing, One Foot on a Step  4 inch;Eyes open;4 reps;10 secs    Marching Limitations  2x10, difficulty    Heel Raises Limitations  x10    Toe Raise Limitations  x10    Sit to Stand Time  x10             PT Long Term Goals - 11/29/17 1440      PT LONG TERM GOAL #1   Title  Patient will be independent with HEP.    Time  4    Period  Weeks    Status  On-going      PT LONG TERM GOAL #2   Title  Patient will improve LE strength as seen by ability to perform 5 sit to stands with no UE support in 20 seconds or less.    Time  4    Period  Weeks    Status  On-going      PT LONG TERM GOAL #3   Title  Patient will improve Berg Balance Scale to 35/56 to decrease risk of falls    Time  4    Period  Weeks    Status  On-going      PT LONG TERM GOAL #4    Title  Patient will negotiate 3 steps with cane with step over step pattern with SPC and no railing to safely enter and exit home.    Time  4    Period  Weeks    Status  On-going            Plan - 11/29/17 1505    Clinical Impression Statement  Patient tolerated treatment well today. Patient HR and O2 WNL today and unable to get reading of BP at this time. Patient felt well and able to complete all activities with only some LOB with standing activities yet able to recover with assist. Patient current goals ongoing at this time. No falls reported.    PT Frequency  2x / week    PT Treatment/Interventions  ADLs/Self Care Home Management;Balance training;Neuromuscular re-education;Gait training;Stair training;Therapeutic exercise;Therapeutic activities;Functional mobility training    PT Next Visit Plan  cont with POC nustep, perform seated and standing balance activities; gait training; LE strengthening    Consulted and Agree with Plan of Care  Patient       Patient will benefit from skilled therapeutic intervention in order to improve the following deficits and impairments:  Abnormal gait, Decreased activity tolerance, Decreased endurance, Decreased strength, Postural dysfunction, Difficulty walking  Visit Diagnosis: Unsteadiness on feet  Muscle weakness (generalized)  Difficulty in walking, not elsewhere classified     Problem List Patient Active Problem List   Diagnosis Date Noted  . NICM (nonischemic cardiomyopathy) ([place]) 10/04/2017  . Normal coronary arteries 10/04/2017  . History of CVA (cerebrovascular accident) 10/04/2017  . Chronic anticoagulation 10/04/2017  . Medication intolerance 10/04/2017  . Visual hallucination   . Sleep apnea   . PONV (postoperative nausea and vomiting)   . Obesity   . Nodule of right lung   . Macular degeneration   . History of kidney stones   . Glaucoma   . GERD (gastroesophageal reflux disease)   . Complication of anesthesia   .  Cataracts, bilateral   . Breast cancer ([place])   . Atrial fibrillation ([place])   .  Anxiety   . Hallucination 08/19/2017  . Mild cognitive disorder 08/19/2017  . Chronic combined systolic and diastolic CHF (congestive heart failure) ([place]) 09/30/2016  . Essential hypertension 09/30/2016  . Acute systolic heart failure ([place]) 08/13/2016  . Gait abnormality 07/01/2016  . OSA on CPAP 07/01/2016  . Slurred speech 07/01/2016  . Hyperlipidemia 09/23/2015  . Lung nodule, solitary 07/12/2012  . Breast cancer, right breast ([place]) 07/11/2012  . Carotid stenosis 06/19/2012  . Bradycardia 06/02/2012  . Dizziness 04/27/2012  . Chest pain 04/27/2012  . Palpitations 04/27/2012  . Hypothyroidism 10/17/2008    Robin Pafford P, PTA 11/29/2017, 3:15 PM  [facility] [place], [place], [address] Phone: [phone]   Fax:  [phone]  Name: Amy Hahn MRN: [id number] Date of Birth: 04-Feb-1936

## 2017-12-01 ENCOUNTER — Encounter: Payer: Medicare Other | Admitting: Physical Therapy

## 2017-12-02 ENCOUNTER — Ambulatory Visit: Payer: Medicare Other | Admitting: Physical Therapy

## 2017-12-02 ENCOUNTER — Encounter: Payer: Self-pay | Admitting: Physical Therapy

## 2017-12-02 VITALS — HR 54

## 2017-12-02 DIAGNOSIS — M6281 Muscle weakness (generalized): Secondary | ICD-10-CM

## 2017-12-02 DIAGNOSIS — R2681 Unsteadiness on feet: Secondary | ICD-10-CM | POA: Diagnosis not present

## 2017-12-02 DIAGNOSIS — R262 Difficulty in walking, not elsewhere classified: Secondary | ICD-10-CM

## 2017-12-02 NOTE — Therapy (Signed)
[facility] [place], [place], [address] Phone: [phone]   Fax:  [phone]  Physical Therapy Treatment  Patient Details  Name: Amy Hahn MRN: [id number] Date of Birth: [birth year] Referring Provider: Marcial Pacas   Encounter Date: 12/02/2017  PT End of Session - 12/02/17 1432    Visit Number  3    Number of Visits  8    Date for PT Re-Evaluation  12/30/17    PT Start Time  0240    PT Stop Time  1515    PT Time Calculation (min)  43 min    Activity Tolerance  Patient tolerated treatment well    Behavior During Therapy  [facility] for tasks assessed/performed       Past Medical History:  Diagnosis Date  . Anxiety   . Atrial fibrillation ([place])    a. paroxysmal, diagnosed in 08/2016 and started on Eliquis  . Breast cancer ([place])   . Cataracts, bilateral   . Chronic combined systolic and diastolic CHF (congestive heart failure) ([place])    a. echo 11/2015: EF 45-50%  b. echo 08/2016: EF 35-40%, diffuse HK, akinesis of mid inferoseptal and apical septal myocardium, mild AR, mild MR, LA moderately dilated.  . Complication of anesthesia   . GERD (gastroesophageal reflux disease)   . Glaucoma   . History of kidney stones    HISTORY OF  . Hyperlipidemia   . Hypertension   . Macular degeneration   . Nodule of right lung   . Obesity   . PONV (postoperative nausea and vomiting)   . Sleep apnea   . Visual hallucination     Past Surgical History:  Procedure Laterality Date  . CATARACT EXTRACTION, BILATERAL    . CESAREAN SECTION    . MASTECTOMY Right 1997 ?  [name] TOTAL KNEE ARTHROPLASTY     x2    Vitals:   12/02/17 1438  Pulse: (!) 54  SpO2: 96%    Subjective Assessment - 12/02/17 1431    Subjective  No complaints upon arrival.    Pertinent History  Cognitive deficits, HTN, CHF, AFib, visual impairments, hallucinations.    Limitations  House hold activities;Standing;Walking    How long can you sit comfortably?   unlimited    How long can you stand comfortably?  5-20 minutes    How long can you walk comfortably?  5-20 minutes    Diagnostic tests  MRI    Patient Stated Goals  improve movement, get more stable    Currently in Pain?  No/denies         [facility] PT Assessment - 12/02/17 0001      Assessment   Medical Diagnosis  Gait abnormality    Next MD Visit  6 months    Prior Therapy  home therapy      Precautions   Precautions  Fall                  [facility] Adult PT Treatment/Exercise - 12/02/17 0001      Lumbar Exercises: Aerobic   Nustep  L3 x50min UE/LE activity, monitored for progression      Lumbar Exercises: Seated   Other Seated Lumbar Exercises  seated core activation with ball reachouts 2x10      Knee/Hip Exercises: Seated   Long Arc Quad  Strengthening;Both;2 sets;10 reps;Weights;Limitations    Long Arc Quad Weight  3 lbs.    Clamshell with TheraBand  Red x20 reps    Marching  AROM;Both;20 reps    Hamstring Curl  Strengthening;Both;20 reps    Hamstring Limitations  red theraband    Sit to Sand  10 reps;without UE support          Balance Exercises - 12/02/17 1518      Balance Exercises: Standing   Standing Eyes Opened  Narrow base of support (BOS);Solid surface x2 min    Tandem Stance  Eyes open;Intermittent upper extremity support x2 min semi    Marching Limitations  2x10 reps             PT Long Term Goals - 11/29/17 1440      PT LONG TERM GOAL #1   Title  Patient will be independent with HEP.    Time  4    Period  Weeks    Status  On-going      PT LONG TERM GOAL #2   Title  Patient will improve LE strength as seen by ability to perform 5 sit to stands with no UE support in 20 seconds or less.    Time  4    Period  Weeks    Status  On-going      PT LONG TERM GOAL #3   Title  Patient will improve Berg Balance Scale to 35/56 to decrease risk of falls    Time  4    Period  Weeks    Status  On-going      PT LONG TERM GOAL #4   Title   Patient will negotiate 3 steps with cane with step over step pattern with SPC and no railing to safely enter and exit home.    Time  4    Period  Weeks    Status  On-going            Plan - 12/02/17 1554    Clinical Impression Statement  Patient tolerated today's treatment fairly well today as she presented in clinic with vitals monitored throughout. Prior to treatment vitals assessed with HR 54 and 94% O2. Vitals monitored after Nustep 87% O2 sat and 92 bpm. Diaphragmatic breathing utilized throughout treatment to normalize HR or O2 sat during treatment. Patient very slow and purposeful with exercises today and required VCs and tactile cues for proper exercise technique as well as parameters. Patient demonstrated minimal to moderate instabiltiy with narrow BOS and semitandem stance on the floor. Close supervision provided by PTA. One occurance of R hand shaking uncontrollably per patient report while the patient was describing the symptom. Patient very concerned with HR levels.    Rehab Potential  Fair    PT Frequency  2x / week    PT Duration  4 weeks    PT Treatment/Interventions  ADLs/Self Care Home Management;Balance training;Neuromuscular re-education;Gait training;Stair training;Therapeutic exercise;Therapeutic activities;Functional mobility training    PT Next Visit Plan  cont with POC nustep, perform seated and standing balance activities; gait training; LE strengthening    Consulted and Agree with Plan of Care  Patient       Patient will benefit from skilled therapeutic intervention in order to improve the following deficits and impairments:  Abnormal gait, Decreased activity tolerance, Decreased endurance, Decreased strength, Postural dysfunction, Difficulty walking  Visit Diagnosis: Unsteadiness on feet  Muscle weakness (generalized)  Difficulty in walking, not elsewhere classified     Problem List Patient Active Problem List   Diagnosis Date Noted  . NICM  (nonischemic cardiomyopathy) ([place]) 10/04/2017  . Normal coronary arteries 10/04/2017  . History of  CVA (cerebrovascular accident) 10/04/2017  . Chronic anticoagulation 10/04/2017  . Medication intolerance 10/04/2017  . Visual hallucination   . Sleep apnea   . PONV (postoperative nausea and vomiting)   . Obesity   . Nodule of right lung   . Macular degeneration   . History of kidney stones   . Glaucoma   . GERD (gastroesophageal reflux disease)   . Complication of anesthesia   . Cataracts, bilateral   . Breast cancer ([place])   . Atrial fibrillation ([place])   . Anxiety   . Hallucination 08/19/2017  . Mild cognitive disorder 08/19/2017  . Chronic combined systolic and diastolic CHF (congestive heart failure) ([place]) 09/30/2016  . Essential hypertension 09/30/2016  . Acute systolic heart failure ([place]) 08/13/2016  . Gait abnormality 07/01/2016  . OSA on CPAP 07/01/2016  . Slurred speech 07/01/2016  . Hyperlipidemia 09/23/2015  . Lung nodule, solitary 07/12/2012  . Breast cancer, right breast ([place]) 07/11/2012  . Carotid stenosis 06/19/2012  . Bradycardia 06/02/2012  . Dizziness 04/27/2012  . Chest pain 04/27/2012  . Palpitations 04/27/2012  . Hypothyroidism 10/17/2008    Standley Brooking, PTA 12/02/2017, 4:01 PM  [facility] [place], [place], [address] Phone: [phone]   Fax:  [phone]  Name: LINEA CALLES MRN: [id number] Date of Birth: 1936-01-09

## 2017-12-06 ENCOUNTER — Ambulatory Visit: Payer: Medicare Other | Attending: Neurology | Admitting: Physical Therapy

## 2017-12-06 ENCOUNTER — Encounter: Payer: Self-pay | Admitting: Physical Therapy

## 2017-12-06 VITALS — HR 58

## 2017-12-06 DIAGNOSIS — R262 Difficulty in walking, not elsewhere classified: Secondary | ICD-10-CM | POA: Insufficient documentation

## 2017-12-06 DIAGNOSIS — R2681 Unsteadiness on feet: Secondary | ICD-10-CM | POA: Diagnosis present

## 2017-12-06 DIAGNOSIS — M6281 Muscle weakness (generalized): Secondary | ICD-10-CM | POA: Diagnosis present

## 2017-12-06 NOTE — Therapy (Signed)
[facility] [place], [place], [address] Phone: [phone]   Fax:  [phone]  Physical Therapy Treatment  Patient Details  Name: Amy Hahn MRN: [id number] Date of Birth: Aug 23, 1936 Referring Provider: Marcial Pacas   Encounter Date: 12/06/2017  PT End of Session - 12/06/17 1306    Visit Number  4    Number of Visits  8    Date for PT Re-Evaluation  12/30/17    PT Start Time  4742    PT Stop Time  5956 2 units secondary to breaks for vitals assessment    PT Time Calculation (min)  41 min    Activity Tolerance  Patient tolerated treatment well    Behavior During Therapy  [facility] for tasks assessed/performed       Past Medical History:  Diagnosis Date  . Anxiety   . Atrial fibrillation ([place])    a. paroxysmal, diagnosed in 08/2016 and started on Eliquis  . Breast cancer ([place])   . Cataracts, bilateral   . Chronic combined systolic and diastolic CHF (congestive heart failure) ([place])    a. echo 11/2015: EF 45-50%  b. echo 08/2016: EF 35-40%, diffuse HK, akinesis of mid inferoseptal and apical septal myocardium, mild AR, mild MR, LA moderately dilated.  . Complication of anesthesia   . GERD (gastroesophageal reflux disease)   . Glaucoma   . History of kidney stones    HISTORY OF  . Hyperlipidemia   . Hypertension   . Macular degeneration   . Nodule of right lung   . Obesity   . PONV (postoperative nausea and vomiting)   . Sleep apnea   . Visual hallucination     Past Surgical History:  Procedure Laterality Date  . CATARACT EXTRACTION, BILATERAL    . CESAREAN SECTION    . MASTECTOMY Right 1997 ?  [name] TOTAL KNEE ARTHROPLASTY     x2    Vitals:   12/06/17 1307  Pulse: (!) 58  SpO2: 92%    Subjective Assessment - 12/06/17 1304    Subjective  Reports low heart rate in last treatment and A fib symptoms and reports she will call the MD when she returns home.    Pertinent History  Cognitive deficits, HTN, CHF, AFib,  visual impairments, hallucinations.    Limitations  House hold activities;Standing;Walking    How long can you sit comfortably?  unlimited    How long can you stand comfortably?  5-20 minutes    How long can you walk comfortably?  5-20 minutes    Diagnostic tests  MRI    Patient Stated Goals  improve movement, get more stable    Currently in Pain?  No/denies                      [facility] Adult PT Treatment/Exercise - 12/06/17 0001      Lumbar Exercises: Aerobic   Nustep  L3 x11min UE/LE activity, monitored for progression 90% O2 sat, 57 bpm HR after      Knee/Hip Exercises: Standing   Heel Raises  Both;20 reps    Hip Flexion  AROM;Both;2 sets;10 reps;Knee bent    Hip Abduction  AROM;Both;15 reps;Knee straight      Knee/Hip Exercises: Seated   Long Arc Quad  Strengthening;Both;2 sets;10 reps;Weights;Limitations    Long Arc Quad Weight  3 lbs.    Clamshell with TheraBand  Red x10 reps    Marching  Other (comment) Attempted but patient reported  heaviness    Hamstring Curl  Strengthening;Both;3 sets;10 reps    Hamstring Limitations  red theraband    Sit to Sand  10 reps;without UE support                  PT Long Term Goals - 11/29/17 1440      PT LONG TERM GOAL #1   Title  Patient will be independent with HEP.    Time  4    Period  Weeks    Status  On-going      PT LONG TERM GOAL #2   Title  Patient will improve LE strength as seen by ability to perform 5 sit to stands with no UE support in 20 seconds or less.    Time  4    Period  Weeks    Status  On-going      PT LONG TERM GOAL #3   Title  Patient will improve Berg Balance Scale to 35/56 to decrease risk of falls    Time  4    Period  Weeks    Status  On-going      PT LONG TERM GOAL #4   Title  Patient will negotiate 3 steps with cane with step over step pattern with SPC and no railing to safely enter and exit home.    Time  4    Period  Weeks    Status  On-going            Plan -  12/06/17 1351    Clinical Impression Statement  Patient tolerated today's treatment fairly well as she fatigued quickly and was more aware and worried regarding low HR. Vitals monitored throughout treatment today. Diaphragmatic breathing instructed by PTA as patient's O2 sat assessed as low as 85% following end of treatment. Patient fatigued quickly with standing activities especially. More B hip IR noted with standing hip flexion/marching. HR upon end of treatment assessed as 57 bpm.    Rehab Potential  Fair    PT Frequency  2x / week    PT Duration  4 weeks    PT Treatment/Interventions  ADLs/Self Care Home Management;Balance training;Neuromuscular re-education;Gait training;Stair training;Therapeutic exercise;Therapeutic activities;Functional mobility training    PT Next Visit Plan  cont with POC nustep, perform seated and standing balance activities; gait training; LE strengthening    Consulted and Agree with Plan of Care  Patient       Patient will benefit from skilled therapeutic intervention in order to improve the following deficits and impairments:  Abnormal gait, Decreased activity tolerance, Decreased endurance, Decreased strength, Postural dysfunction, Difficulty walking  Visit Diagnosis: Unsteadiness on feet  Muscle weakness (generalized)  Difficulty in walking, not elsewhere classified     Problem List Patient Active Problem List   Diagnosis Date Noted  . NICM (nonischemic cardiomyopathy) ([place]) 10/04/2017  . Normal coronary arteries 10/04/2017  . History of CVA (cerebrovascular accident) 10/04/2017  . Chronic anticoagulation 10/04/2017  . Medication intolerance 10/04/2017  . Visual hallucination   . Sleep apnea   . PONV (postoperative nausea and vomiting)   . Obesity   . Nodule of right lung   . Macular degeneration   . History of kidney stones   . Glaucoma   . GERD (gastroesophageal reflux disease)   . Complication of anesthesia   . Cataracts, bilateral   .  Breast cancer ([place])   . Atrial fibrillation ([place])   . Anxiety   . Hallucination 08/19/2017  . Mild cognitive  disorder 08/19/2017  . Chronic combined systolic and diastolic CHF (congestive heart failure) ([place]) 09/30/2016  . Essential hypertension 09/30/2016  . Acute systolic heart failure ([place]) 08/13/2016  . Gait abnormality 07/01/2016  . OSA on CPAP 07/01/2016  . Slurred speech 07/01/2016  . Hyperlipidemia 09/23/2015  . Lung nodule, solitary 07/12/2012  . Breast cancer, right breast ([place]) 07/11/2012  . Carotid stenosis 06/19/2012  . Bradycardia 06/02/2012  . Dizziness 04/27/2012  . Chest pain 04/27/2012  . Palpitations 04/27/2012  . Hypothyroidism 10/17/2008    Standley Brooking, PTA 12/06/2017, 1:57 PM  [facility] [address] [place], [place], [address] Phone: [phone]   Fax:  [phone]  Name: Amy Hahn MRN: [id number] Date of Birth: [birth year]

## 2017-12-08 ENCOUNTER — Ambulatory Visit: Payer: Medicare Other | Admitting: Physical Therapy

## 2017-12-08 ENCOUNTER — Telehealth: Payer: Self-pay | Admitting: Cardiovascular Disease

## 2017-12-08 DIAGNOSIS — R2681 Unsteadiness on feet: Secondary | ICD-10-CM | POA: Diagnosis not present

## 2017-12-08 DIAGNOSIS — M6281 Muscle weakness (generalized): Secondary | ICD-10-CM

## 2017-12-08 DIAGNOSIS — R262 Difficulty in walking, not elsewhere classified: Secondary | ICD-10-CM

## 2017-12-08 NOTE — Telephone Encounter (Signed)
Returned call to patient of Dr. Gwenlyn Found. She reports she has been having physical therapy in [place]. She states almost every day she has been to therapy, her HR has been down to 45 but will go back to 80s-90s after a few minutes. She states her heart is beating like crazy. She has permanent AF.   She would like an appointment but can only come in on a Monday afternoon d/t transportation.  Advised would call AF clinic to see if she can be seen. Scheduled patient for 3/11 @ 2pm with Butch Penny, NP per Estill Bamberg CMA  Provided patient with AF clinic phone # and general instructions for how to access heart & vascular center  Advised that if patient has worsening SOB, develops CP with her AF issues, she should go to hospital [facility] nearest for her) for evaluation   Routed to MD as Juluis Rainier

## 2017-12-08 NOTE — Telephone Encounter (Signed)
New message   Patient calling with HR concerns   STAT if HR is under 50 or over 120 (normal HR is 60-100 beats per minute)  1) What is your heart rate? 43  2) Do you have a log of your heart rate readings (document readings)? 45, 90  3) Do you have any other symptoms? HR has been low the last few days. History of afib, some shortness of breath

## 2017-12-08 NOTE — Telephone Encounter (Signed)
Agree with appointment in the atrial fibrillation clinic.

## 2017-12-08 NOTE — Therapy (Addendum)
[facility] [place], [place], [address] Phone: [phone]   Fax:  [phone]  Physical Therapy Treatment  Patient Details  Name: Amy Hahn MRN: [id number] Date of Birth: [birth year] Referring Provider: Marcial Pacas   Encounter Date: 12/08/2017  PT End of Session - 12/08/17 1308    Visit Number  5    Number of Visits  8    Date for PT Re-Evaluation  12/30/17    PT Start Time  1300    PT Stop Time  1345    PT Time Calculation (min)  45 min    Activity Tolerance  Patient tolerated treatment well    Behavior During Therapy  [facility] for tasks assessed/performed       Past Medical History:  Diagnosis Date  . Anxiety   . Atrial fibrillation ([place])    a. paroxysmal, diagnosed in 08/2016 and started on Eliquis  . Breast cancer ([place])   . Cataracts, bilateral   . Chronic combined systolic and diastolic CHF (congestive heart failure) ([place])    a. echo 11/2015: EF 45-50%  b. echo 08/2016: EF 35-40%, diffuse HK, akinesis of mid inferoseptal and apical septal myocardium, mild AR, mild MR, LA moderately dilated.  . Complication of anesthesia   . GERD (gastroesophageal reflux disease)   . Glaucoma   . History of kidney stones    HISTORY OF  . Hyperlipidemia   . Hypertension   . Macular degeneration   . Nodule of right lung   . Obesity   . PONV (postoperative nausea and vomiting)   . Sleep apnea   . Visual hallucination     Past Surgical History:  Procedure Laterality Date  . CATARACT EXTRACTION, BILATERAL    . CESAREAN SECTION    . MASTECTOMY Right 1997 ?  [name] TOTAL KNEE ARTHROPLASTY     x2    There were no vitals filed for this visit.  Subjective Assessment - 12/08/17 1308    Subjective  Patient reported no new complaints.    Pertinent History  Cognitive deficits, HTN, CHF, AFib, visual impairments, hallucinations.    Limitations  House hold activities;Standing;Walking    How long can you sit comfortably?  unlimited     How long can you stand comfortably?  5-20 minutes    How long can you walk comfortably?  5-20 minutes    Diagnostic tests  MRI    Patient Stated Goals  improve movement, get more stable         [facility] PT Assessment - 12/08/17 0001      Assessment   Medical Diagnosis  Gait abnormality    Next MD Visit  6 months    Prior Therapy  home therapy      Precautions   Precautions  Fall                  [facility] Adult PT Treatment/Exercise - 12/08/17 0001      Lumbar Exercises: Aerobic   Nustep  L3 x3mn UE/LE activity, monitored for progression      Knee/Hip Exercises: Seated   Long Arc Quad  Strengthening;Both;2 sets;10 reps;Weights;Limitations    Long Arc Quad Weight  4 lbs.    Marching  Strengthening;Both;2 sets;10 reps;Weights    Marching Weights  4 lbs.    Hamstring Curl  Strengthening;Both;3 sets;10 reps    Sit to Sand  10 reps;without UE support elevated plinth          Balance Exercises -  12/08/17 1312      Balance Exercises: Standing   Gait with Head Turns  Forward;2 reps in carpeted [address] way with small base quad cane    Marching Limitations  --             PT Long Term Goals - 11/29/17 1440      PT LONG TERM GOAL #1   Title  Patient will be independent with HEP.    Time  4    Period  Weeks    Status  On-going      PT LONG TERM GOAL #2   Title  Patient will improve LE strength as seen by ability to perform 5 sit to stands with no UE support in 20 seconds or less.    Time  4    Period  Weeks    Status  On-going      PT LONG TERM GOAL #3   Title  Patient will improve Berg Balance Scale to 35/56 to decrease risk of falls    Time  4    Period  Weeks    Status  On-going      PT LONG TERM GOAL #4   Title  Patient will negotiate 3 steps with cane with step over step pattern with SPC and no railing to safely enter and exit home.    Time  4    Period  Weeks    Status  On-going            Plan - 12/08/17 1310    Clinical Impression  Statement  Patient's vitals assessed prior to Nustep: 93% SaO2, 58 bpm. After nustep: 86% SaO2, 60 bpm. Patient instructed to perform purse lip breathing and was cued to place hand on stomach as a tactile cue to breathe with diaprhagm. After rest, pts vitals returned to normal. Patient had some difficulty with gait with head turns. Patient slowed cadence and was hesitant to look up while walking. Contact guard assist provided during gait with head turns. Patient did not demonstrate LOB during exercise but did mention she felt "hazy". Vitals assessed after gait: 95% SaO2, 80 bpm.     Clinical Presentation  Evolving    Clinical Decision Making  High    Rehab Potential  Fair    PT Frequency  2x / week    PT Duration  4 weeks    PT Treatment/Interventions  ADLs/Self Care Home Management;Balance training;Neuromuscular re-education;Gait training;Stair training;Therapeutic exercise;Therapeutic activities;Functional mobility training    PT Next Visit Plan  Contniue with POC to improve strength, balance and gait.     Consulted and Agree with Plan of Care  Patient       Patient will benefit from skilled therapeutic intervention in order to improve the following deficits and impairments:  Abnormal gait, Decreased activity tolerance, Decreased endurance, Decreased strength, Postural dysfunction, Difficulty walking  Visit Diagnosis: Unsteadiness on feet  Muscle weakness (generalized)  Difficulty in walking, not elsewhere classified     Problem List Patient Active Problem List   Diagnosis Date Noted  . NICM (nonischemic cardiomyopathy) ([place]) 10/04/2017  . Normal coronary arteries 10/04/2017  . History of CVA (cerebrovascular accident) 10/04/2017  . Chronic anticoagulation 10/04/2017  . Medication intolerance 10/04/2017  . Visual hallucination   . Sleep apnea   . PONV (postoperative nausea and vomiting)   . Obesity   . Nodule of right lung   . Macular degeneration   . History of kidney stones    . Glaucoma   .  GERD (gastroesophageal reflux disease)   . Complication of anesthesia   . Cataracts, bilateral   . Breast cancer ([place])   . Atrial fibrillation ([place])   . Anxiety   . Hallucination 08/19/2017  . Mild cognitive disorder 08/19/2017  . Chronic combined systolic and diastolic CHF (congestive heart failure) ([place]) 09/30/2016  . Essential hypertension 09/30/2016  . Acute systolic heart failure ([place]) 08/13/2016  . Gait abnormality 07/01/2016  . OSA on CPAP 07/01/2016  . Slurred speech 07/01/2016  . Hyperlipidemia 09/23/2015  . Lung nodule, solitary 07/12/2012  . Breast cancer, right breast ([place]) 07/11/2012  . Carotid stenosis 06/19/2012  . Bradycardia 06/02/2012  . Dizziness 04/27/2012  . Chest pain 04/27/2012  . Palpitations 04/27/2012  . Hypothyroidism 10/17/2008    PHYSICAL THERAPY DISCHARGE SUMMARY  Visits from Start of Care: 5   Current functional level related to goals / functional outcomes: See above   Remaining deficits: See goals   Education / Equipment: HEP   Plan: Patient agrees to discharge.  Patient goals were not met. Patient is being discharged due to not returning since the last visit.  ?????      Gabriela Eves, PT, DPT 12/08/2017, 5:27 PM  [facility] [address] [place], [place], [address] Phone: [phone]   Fax:  [phone]  Name: CATIA TODOROV MRN: [id number] Date of Birth: [birth year]

## 2017-12-13 ENCOUNTER — Other Ambulatory Visit: Payer: Self-pay

## 2017-12-13 ENCOUNTER — Encounter (HOSPITAL_COMMUNITY): Payer: Self-pay | Admitting: Nurse Practitioner

## 2017-12-13 ENCOUNTER — Ambulatory Visit (HOSPITAL_COMMUNITY)
Admission: RE | Admit: 2017-12-13 | Discharge: 2017-12-13 | Disposition: A | Discharge status: Home / Self Care | Payer: Medicare Other | Source: Ambulatory Visit | Attending: Nurse Practitioner | Admitting: Nurse Practitioner

## 2017-12-13 VITALS — BP 128/72 | HR 54 | Ht 61.5 in | Wt 200.0 lb

## 2017-12-13 DIAGNOSIS — G4730 Sleep apnea, unspecified: Secondary | ICD-10-CM | POA: Insufficient documentation

## 2017-12-13 DIAGNOSIS — I110 Hypertensive heart disease with heart failure: Secondary | ICD-10-CM | POA: Insufficient documentation

## 2017-12-13 DIAGNOSIS — Z79899 Other long term (current) drug therapy: Secondary | ICD-10-CM | POA: Diagnosis not present

## 2017-12-13 DIAGNOSIS — H3530 Unspecified macular degeneration: Secondary | ICD-10-CM | POA: Diagnosis not present

## 2017-12-13 DIAGNOSIS — Z885 Allergy status to narcotic agent status: Secondary | ICD-10-CM | POA: Insufficient documentation

## 2017-12-13 DIAGNOSIS — Z87891 Personal history of nicotine dependence: Secondary | ICD-10-CM | POA: Diagnosis not present

## 2017-12-13 DIAGNOSIS — I482 Chronic atrial fibrillation: Secondary | ICD-10-CM | POA: Diagnosis not present

## 2017-12-13 DIAGNOSIS — I5042 Chronic combined systolic (congestive) and diastolic (congestive) heart failure: Secondary | ICD-10-CM | POA: Insufficient documentation

## 2017-12-13 DIAGNOSIS — F419 Anxiety disorder, unspecified: Secondary | ICD-10-CM | POA: Insufficient documentation

## 2017-12-13 DIAGNOSIS — Z880 Allergy status to penicillin: Secondary | ICD-10-CM | POA: Diagnosis not present

## 2017-12-13 DIAGNOSIS — Z881 Allergy status to other antibiotic agents status: Secondary | ICD-10-CM | POA: Diagnosis not present

## 2017-12-13 DIAGNOSIS — E785 Hyperlipidemia, unspecified: Secondary | ICD-10-CM | POA: Insufficient documentation

## 2017-12-13 DIAGNOSIS — R918 Other nonspecific abnormal finding of lung field: Secondary | ICD-10-CM | POA: Insufficient documentation

## 2017-12-13 DIAGNOSIS — E669 Obesity, unspecified: Secondary | ICD-10-CM | POA: Diagnosis not present

## 2017-12-13 DIAGNOSIS — Z87442 Personal history of urinary calculi: Secondary | ICD-10-CM | POA: Insufficient documentation

## 2017-12-13 DIAGNOSIS — I4821 Permanent atrial fibrillation: Secondary | ICD-10-CM

## 2017-12-13 DIAGNOSIS — Z888 Allergy status to other drugs, medicaments and biological substances status: Secondary | ICD-10-CM | POA: Insufficient documentation

## 2017-12-13 DIAGNOSIS — R9431 Abnormal electrocardiogram [ECG] [EKG]: Secondary | ICD-10-CM | POA: Diagnosis not present

## 2017-12-13 DIAGNOSIS — H409 Unspecified glaucoma: Secondary | ICD-10-CM | POA: Insufficient documentation

## 2017-12-13 DIAGNOSIS — Z853 Personal history of malignant neoplasm of breast: Secondary | ICD-10-CM | POA: Insufficient documentation

## 2017-12-13 DIAGNOSIS — I509 Heart failure, unspecified: Secondary | ICD-10-CM | POA: Diagnosis present

## 2017-12-13 DIAGNOSIS — K219 Gastro-esophageal reflux disease without esophagitis: Secondary | ICD-10-CM | POA: Insufficient documentation

## 2017-12-13 MED ORDER — METOPROLOL TARTRATE 25 MG PO TABS: 12.5000 mg | ORAL_TABLET | Freq: Two times a day (BID) | ORAL | 6 refills | Status: AC

## 2017-12-13 NOTE — Patient Instructions (Signed)
Decrease metoprolol to 12.5mg  twice a day (1/2 tab of the 25mg )

## 2017-12-14 NOTE — Addendum Note (Signed)
Encounter addended by: Sherran Needs, NP on: 12/14/2017 9:36 AM  Actions taken: Sign clinical note

## 2017-12-14 NOTE — Progress Notes (Signed)
Primary Care Physician: [name], MD Referring Physician: Dr. Sherol Dade is a 82 y.o. female with a h/o permanent afib, CHF,  that is in the afib clinic on referral from Dr. Kennon Holter office. She started physical therapy for deconditioning, gait instability, several weeks ago and has now finished. When the physical therapist's checked her HR they were concerned with low HR readings which was reported to Dr. Kennon Holter office. Pt is here with her daughter today.  Ekg today shows afib at 54 bpm. Pt has noted some HR readings at home in the 40's. Other than noting the low readings, she does not seem to be symptomatic with this.  Today, she denies symptoms of palpitations, chest pain, shortness of breath, orthopnea, PND, lower extremity edema, dizziness, presyncope, syncope, or neurologic sequela. The patient is tolerating medications without difficulties and is otherwise without complaint today.   Past Medical History:  Diagnosis Date  . Anxiety   . Atrial fibrillation ([place])    a. paroxysmal, diagnosed in 08/2016 and started on Eliquis  . Breast cancer ([place])   . Cataracts, bilateral   . Chronic combined systolic and diastolic CHF (congestive heart failure) ([place])    a. echo 11/2015: EF 45-50%  b. echo 08/2016: EF 35-40%, diffuse HK, akinesis of mid inferoseptal and apical septal myocardium, mild AR, mild MR, LA moderately dilated.  . Complication of anesthesia   . GERD (gastroesophageal reflux disease)   . Glaucoma   . History of kidney stones    HISTORY OF  . Hyperlipidemia   . Hypertension   . Macular degeneration   . Nodule of right lung   . Obesity   . PONV (postoperative nausea and vomiting)   . Sleep apnea   . Visual hallucination    Past Surgical History:  Procedure Laterality Date  . CATARACT EXTRACTION, BILATERAL    . CESAREAN SECTION    . MASTECTOMY Right 1997 ?  [name] TOTAL KNEE ARTHROPLASTY     x2    Current Outpatient Medications  Medication Sig Dispense  Refill  . acetaminophen (TYLENOL) 500 MG tablet Take 500 mg by mouth every 6 (six) hours as needed for moderate pain.     [name] amLODipine (NORVASC) 5 MG tablet Take 1 tablet (5 mg total) by mouth daily. 30 tablet 3  . brimonidine (ALPHAGAN) 0.2 % ophthalmic solution Place 2 drops into the right eye daily.     . Cholecalciferol 5000 units TABS Take 5,000 Units by mouth every evening.    . dorzolamide-timolol (COSOPT) 22.3-6.8 MG/ML ophthalmic solution Place 1 drop into the right eye 2 (two) times daily.     [name] ELIQUIS 5 MG TABS tablet TAKE 1 TABLET BY MOUTH TWICE A DAY 180 tablet 1  . furosemide (LASIX) 40 MG tablet TAKE 1 TABLET BY MOUTH EVERY DAY (Patient taking differently: Take 40 mg by mouth once a day) 90 tablet 3  . hydrALAZINE (APRESOLINE) 25 MG tablet Take 1 tablet (25 mg total) by mouth 3 (three) times daily. 270 tablet 1  . isosorbide mononitrate (IMDUR) 30 MG 24 hr tablet Take 0.5 tablets (15 mg total) by mouth daily. 45 tablet 1  . latanoprost (XALATAN) 0.005 % ophthalmic solution Place 1 drop into both eyes at bedtime.    [name] levothyroxine (SYNTHROID, LEVOTHROID) 88 MCG tablet Take 88 mcg by mouth daily before breakfast.    . meclizine (ANTIVERT) 25 MG tablet Take 25 mg by mouth at bedtime.     [name]  metoprolol tartrate (LOPRESSOR) 25 MG tablet Take 0.5 tablets (12.5 mg total) by mouth 2 (two) times daily. 60 tablet 6  . Multiple Vitamin (MULTIVITAMIN WITH MINERALS) TABS tablet Take 1 tablet by mouth daily.    . Multiple Vitamins-Minerals (PRESERVISION AREDS 2) CAPS Take 1 capsule by mouth 2 (two) times daily.    . nitroGLYCERIN (NITROSTAT) 0.4 MG SL tablet Place 1 tablet (0.4 mg total) under the tongue every 5 (five) minutes as needed for chest pain. 25 tablet 6  . omeprazole (PRILOSEC) 20 MG capsule Take 20 mg by mouth at bedtime.     . prednisoLONE acetate (PRED FORTE) 1 % ophthalmic suspension Place 1 drop into the left eye 2 (two) times daily.    . QUEtiapine (SEROQUEL) 50 MG tablet Take  50 mg by mouth at bedtime.    . rosuvastatin (CRESTOR) 20 MG tablet Take 20 mg by mouth every evening.    . traZODone (DESYREL) 50 MG tablet Take 50 mg by mouth at bedtime.    . cetirizine (ZYRTEC) 10 MG tablet Take 10 mg by mouth daily as needed for allergies.     . potassium chloride (KLOR-CON) 20 MEQ packet Take 20 mEq by mouth 2 (two) times daily. (Patient not taking: Reported on 12/13/2017) 60 packet 6   No current facility-administered medications for this encounter.     Allergies  Allergen Reactions  . Amitriptyline Other (See Comments)    Weakness   . Edarbi [Azilsartan] Other (See Comments)    Lupus  . Morphine And Related Other (See Comments)    "MAKES ME CRAZY"  . Amlodipine Other (See Comments)    Headache   . Atenolol Other (See Comments)    "decreases blood pressure"  . Codeine Nausea Only  . Doxazosin Other (See Comments)    Leg pain  . Dyazide [Hydrochlorothiazide W-Triamterene] Itching  . Erythromycin Diarrhea and Other (See Comments)    Also cramping  . Labetalol Other (See Comments)    Bradycardia   . Levatol [Penbutolol Sulfate] Swelling    Eye swelling  . Losartan Other (See Comments)    Headache   . Monopril [Fosinopril] Hives  . Contrast Media [Iodinated Diagnostic Agents]   . Ace Inhibitors Cough  . Ampicillin Rash  . Belviq [Lorcaserin Hcl] Itching  . Butrans [Buprenorphine] Rash  . Clotrimazole-Betamethasone Rash  . Elocon [Mometasone Furoate] Rash  . Latex Rash    " IF ON ME FOR OVER 24 HOURS"  . Penicillins Itching and Rash    Has patient had a PCN reaction causing immediate rash, facial/tongue/throat swelling, SOB or lightheadedness with hypotension: Yes Has patient had a PCN reaction causing severe rash involving mucus membranes or skin necrosis: Unknown Has patient had a PCN reaction that required hospitalization: Unknown Has patient had a PCN reaction occurring within the last 10 years: No If all of the above answers are "NO", then  may proceed with Cephalosporin use.   [name] Propranolol Itching  . Tape Rash and Other (See Comments)    Does okay with paper tape, but IT CANNOT BE LEFT FOR AN EXTENDED PERIOD OF TIME. EKG LEADS WILL "PULL OFF HER SKIN"    Social History   Socioeconomic History  . Marital status: Married    Spouse name: Not on file  . Number of children: Not on file  . Years of education: GED  . Highest education level: Not on file  Social Needs  . Financial resource strain: Not on file  . Food  insecurity - worry: Not on file  . Food insecurity - inability: Not on file  . Transportation needs - medical: Not on file  . Transportation needs - non-medical: Not on file  Occupational History  . Occupation: Retired  Tobacco Use  . Smoking status: Former Smoker    Packs/day: 1.00    Types: Cigarettes    Start date: 08/09/1960  . Smokeless tobacco: Never Used  Substance and Sexual Activity  . Alcohol use: No  . Drug use: No  . Sexual activity: Not on file  Other Topics Concern  . Not on file  Social History Narrative   Lives at home with husband.   Right-handed.   No daily use of caffeine.    Family History  Problem Relation Age of Onset  . Diabetes Mother   . Macular degeneration Mother   . Heart attack Father   . Cancer Father        Colon and Prostate Cancer  . Diabetes Sister   . Diabetes Brother   . Heart attack Brother   . Diabetes Brother     ROS- All systems are reviewed and negative except as per the HPI above  Physical Exam: Vitals:   12/13/17 1401  BP: 128/72  Pulse: (!) 54  SpO2: (!) 87%  Weight: 200 lb (90.7 kg)  Height: 5' 1.5" (1.562 m)   Wt Readings from Last 3 Encounters:  12/13/17 200 lb (90.7 kg)  11/18/17 199 lb 4 oz (90.4 kg)  10/04/17 199 lb 12.8 oz (90.6 kg)    Labs: Lab Results  Component Value Date   NA 139 06/03/2017   K 3.2 (L) 06/03/2017   CL 103 06/03/2017   CO2 26 06/03/2017   GLUCOSE 152 (H) 06/03/2017   BUN 16 06/03/2017   CREATININE  1.26 (H) 06/03/2017   CALCIUM 9.7 06/03/2017   Lab Results  Component Value Date   INR 1.68 03/12/2017   No results found for: CHOL, HDL, LDLCALC, TRIG   GEN- The patient is well appearing, alert and oriented x 3 today.   Head- normocephalic, atraumatic Eyes-  Sclera clear, conjunctiva pink Ears- hearing intact Oropharynx- clear Neck- supple, no JVP Lymph- no cervical lymphadenopathy Lungs- Clear to ausculation bilaterally, normal work of breathing Heart- irregular rate and rhythm, no murmurs, rubs or gallops, PMI not laterally displaced GI- soft, NT, ND, + BS Extremities- no clubbing, cyanosis, or edema MS- no significant deformity or atrophy Skin- no rash or lesion Psych- euthymic mood, full affect Neuro- strength and sensation are intact  EKG- Afib at 54 bpm, qrs int 94 ms, qtc at 464 ms Epic records reviewed    Assessment and Plan: 1. Long standing permanent afib Now appears to be having low HR readings Will decrease metoprolol to 25 mg, 1/2 tab bid Monitor discussed but pt defers at this time  F/u here in 2 weeks. Pt will monitor HR and BP at home in the interim   Butch Penny C. Detta Mellin, [facility] [address] [place], [place] [address] [phone]

## 2017-12-18 ENCOUNTER — Other Ambulatory Visit: Payer: Self-pay | Admitting: Cardiovascular Disease

## 2017-12-20 NOTE — Telephone Encounter (Signed)
REFILL 

## 2017-12-22 ENCOUNTER — Ambulatory Visit (HOSPITAL_COMMUNITY): Payer: Medicare Other | Admitting: Nurse Practitioner

## 2017-12-29 ENCOUNTER — Ambulatory Visit (HOSPITAL_COMMUNITY)
Admission: RE | Admit: 2017-12-29 | Discharge: 2017-12-29 | Disposition: A | Discharge status: Home / Self Care | Payer: Medicare Other | Source: Ambulatory Visit | Attending: Nurse Practitioner | Admitting: Nurse Practitioner

## 2017-12-29 ENCOUNTER — Encounter (HOSPITAL_COMMUNITY): Payer: Self-pay | Admitting: Nurse Practitioner

## 2017-12-29 VITALS — BP 118/68 | HR 55 | Ht 61.5 in | Wt 193.0 lb

## 2017-12-29 DIAGNOSIS — Z87442 Personal history of urinary calculi: Secondary | ICD-10-CM | POA: Diagnosis not present

## 2017-12-29 DIAGNOSIS — Z87891 Personal history of nicotine dependence: Secondary | ICD-10-CM | POA: Diagnosis not present

## 2017-12-29 DIAGNOSIS — I110 Hypertensive heart disease with heart failure: Secondary | ICD-10-CM | POA: Insufficient documentation

## 2017-12-29 DIAGNOSIS — I5042 Chronic combined systolic (congestive) and diastolic (congestive) heart failure: Secondary | ICD-10-CM | POA: Insufficient documentation

## 2017-12-29 DIAGNOSIS — F419 Anxiety disorder, unspecified: Secondary | ICD-10-CM | POA: Insufficient documentation

## 2017-12-29 DIAGNOSIS — E785 Hyperlipidemia, unspecified: Secondary | ICD-10-CM | POA: Diagnosis not present

## 2017-12-29 DIAGNOSIS — Z853 Personal history of malignant neoplasm of breast: Secondary | ICD-10-CM | POA: Diagnosis not present

## 2017-12-29 DIAGNOSIS — Z7901 Long term (current) use of anticoagulants: Secondary | ICD-10-CM | POA: Insufficient documentation

## 2017-12-29 DIAGNOSIS — I482 Chronic atrial fibrillation: Secondary | ICD-10-CM

## 2017-12-29 DIAGNOSIS — I10 Essential (primary) hypertension: Secondary | ICD-10-CM

## 2017-12-29 DIAGNOSIS — Z79899 Other long term (current) drug therapy: Secondary | ICD-10-CM | POA: Diagnosis not present

## 2017-12-29 DIAGNOSIS — H409 Unspecified glaucoma: Secondary | ICD-10-CM | POA: Insufficient documentation

## 2017-12-29 DIAGNOSIS — Z79890 Hormone replacement therapy (postmenopausal): Secondary | ICD-10-CM | POA: Diagnosis not present

## 2017-12-29 DIAGNOSIS — G4730 Sleep apnea, unspecified: Secondary | ICD-10-CM | POA: Insufficient documentation

## 2017-12-29 DIAGNOSIS — R001 Bradycardia, unspecified: Secondary | ICD-10-CM | POA: Insufficient documentation

## 2017-12-29 DIAGNOSIS — I4891 Unspecified atrial fibrillation: Secondary | ICD-10-CM | POA: Diagnosis present

## 2017-12-29 DIAGNOSIS — K219 Gastro-esophageal reflux disease without esophagitis: Secondary | ICD-10-CM | POA: Diagnosis not present

## 2017-12-29 DIAGNOSIS — H3530 Unspecified macular degeneration: Secondary | ICD-10-CM | POA: Insufficient documentation

## 2017-12-29 DIAGNOSIS — I4821 Permanent atrial fibrillation: Secondary | ICD-10-CM

## 2017-12-29 HISTORY — DX: Permanent atrial fibrillation: I48.21

## 2017-12-29 NOTE — Progress Notes (Signed)
Primary Care Physician: Amy Gravel, MD Primary Cardiologist: Dr Amy Hahn is a 82 y.o. female with a history of permanent atrial fibrillation who presents for consultation in the [name].  She recently was seen by Amy Palau NP with symptomatic bradycardia.  Her metoprolol was reduced.  She has done very well since that time.  No new concerns today.  Energy is better.  Her unsteadiness and dizziness have resolved. Today, she denies symptoms of palpitations, chest pain, shortness of breath, orthopnea, PND, lower extremity edema, dizziness, presyncope, syncope, snoring, daytime somnolence, bleeding, or neurologic sequela. The patient is tolerating medications without difficulties and is otherwise without complaint today.    Atrial Fibrillation Risk Factors:  she does not have symptoms or diagnosis of sleep apnea.  she does not have a history of rheumatic fever.  she does not have a history of alcohol use.  she has a BMI of Body mass index is 35.88 kg/m.[name] Filed Weights   12/29/17 1335  Weight: 193 lb (87.5 kg)     Atrial Fibrillation Management history:    Previous ablations: none  CHADS2VASC score: 5  Anticoagulation history: eliquis   Past Medical History:  Diagnosis Date  . Anxiety   . Breast cancer ([name])   . Cataracts, bilateral   . Chronic combined systolic and diastolic CHF (congestive heart failure) ([place])    a. echo 11/2015: EF 45-50%  b. echo 08/2016: EF 35-40%, diffuse HK, akinesis of mid inferoseptal and apical septal myocardium, mild AR, mild MR, LA moderately dilated.  . Complication of anesthesia   . GERD (gastroesophageal reflux disease)   . Glaucoma   . History of kidney stones    HISTORY OF  . Hyperlipidemia   . Hypertension   . Macular degeneration   . Nodule of right lung   . Obesity   . Permanent atrial fibrillation ([place]) 2017      . PONV (postoperative nausea and vomiting)   . Sleep apnea   . Visual  hallucination    Past Surgical History:  Procedure Laterality Date  . CATARACT EXTRACTION, BILATERAL    . CESAREAN SECTION    . MASTECTOMY Right 1997 ?  [name] TOTAL KNEE ARTHROPLASTY     x2    Current Outpatient Medications  Medication Sig Dispense Refill  . acetaminophen (TYLENOL) 500 MG tablet Take 500 mg by mouth every 6 (six) hours as needed for moderate pain.     [name] amLODipine (NORVASC) 5 MG tablet Take 1 tablet (5 mg total) by mouth daily. 30 tablet 3  . brimonidine (ALPHAGAN) 0.2 % ophthalmic solution Place 2 drops into the right eye daily.     . cetirizine (ZYRTEC) 10 MG tablet Take 10 mg by mouth daily as needed for allergies.     . Cholecalciferol 5000 units TABS Take 5,000 Units by mouth every evening.    . dorzolamide-timolol (COSOPT) 22.3-6.8 MG/ML ophthalmic solution Place 1 drop into the right eye 2 (two) times daily.     [name] ELIQUIS 5 MG TABS tablet TAKE 1 TABLET BY MOUTH TWICE A DAY 180 tablet 1  . furosemide (LASIX) 40 MG tablet TAKE 1 TABLET BY MOUTH EVERY DAY (Patient taking differently: Take 40 mg by mouth once a day) 90 tablet 3  . hydrALAZINE (APRESOLINE) 25 MG tablet TAKE 1 TABLET BY MOUTH THREE TIMES A DAY 270 tablet 1  . isosorbide mononitrate (IMDUR) 30 MG 24 hr tablet Take 0.5 tablets (15 mg  total) by mouth daily. 45 tablet 1  . latanoprost (XALATAN) 0.005 % ophthalmic solution Place 1 drop into both eyes at bedtime.    [name] levothyroxine (SYNTHROID, LEVOTHROID) 88 MCG tablet Take 88 mcg by mouth daily before breakfast.    . meclizine (ANTIVERT) 25 MG tablet Take 25 mg by mouth at bedtime.     . metoprolol tartrate (LOPRESSOR) 25 MG tablet Take 0.5 tablets (12.5 mg total) by mouth 2 (two) times daily. 60 tablet 6  . Multiple Vitamin (MULTIVITAMIN WITH MINERALS) TABS tablet Take 1 tablet by mouth daily.    . Multiple Vitamins-Minerals (PRESERVISION AREDS 2) CAPS Take 1 capsule by mouth 2 (two) times daily.    . nitroGLYCERIN (NITROSTAT) 0.4 MG SL tablet Place 1 tablet  (0.4 mg total) under the tongue every 5 (five) minutes as needed for chest pain. 25 tablet 6  . omeprazole (PRILOSEC) 20 MG capsule Take 20 mg by mouth at bedtime.     . potassium chloride (KLOR-CON) 20 MEQ packet Take 20 mEq by mouth 2 (two) times daily. 60 packet 6  . prednisoLONE acetate (PRED FORTE) 1 % ophthalmic suspension Place 1 drop into the left eye 2 (two) times daily.    . QUEtiapine (SEROQUEL) 50 MG tablet Take 50 mg by mouth at bedtime.    . rosuvastatin (CRESTOR) 20 MG tablet Take 20 mg by mouth every evening.    . traZODone (DESYREL) 50 MG tablet Take 50 mg by mouth at bedtime.     No current facility-administered medications for this encounter.     Allergies  Allergen Reactions  . Amitriptyline Other (See Comments)    Weakness   . Edarbi [Azilsartan] Other (See Comments)    Lupus  . Morphine And Related Other (See Comments)    "MAKES ME CRAZY"  . Amlodipine Other (See Comments)    Headache   . Atenolol Other (See Comments)    "decreases blood pressure"  . Codeine Nausea Only  . Doxazosin Other (See Comments)    Leg pain  . Dyazide [Hydrochlorothiazide W-Triamterene] Itching  . Erythromycin Diarrhea and Other (See Comments)    Also cramping  . Labetalol Other (See Comments)    Bradycardia   . Levatol [Penbutolol Sulfate] Swelling    Eye swelling  . Losartan Other (See Comments)    Headache   . Monopril [Fosinopril] Hives  . Contrast Media [Iodinated Diagnostic Agents]   . Ace Inhibitors Cough  . Ampicillin Rash  . Belviq [Lorcaserin Hcl] Itching  . Butrans [Buprenorphine] Rash  . Clotrimazole-Betamethasone Rash  . Elocon [Mometasone Furoate] Rash  . Latex Rash    " IF ON ME FOR OVER 24 HOURS"  . Penicillins Itching and Rash    Has patient had a PCN reaction causing immediate rash, facial/tongue/throat swelling, SOB or lightheadedness with hypotension: Yes Has patient had a PCN reaction causing severe rash involving mucus membranes or skin necrosis:  Unknown Has patient had a PCN reaction that required hospitalization: Unknown Has patient had a PCN reaction occurring within the last 10 years: No If all of the above answers are "NO", then may proceed with Cephalosporin use.   [name] Propranolol Itching  . Tape Rash and Other (See Comments)    Does okay with paper tape, but IT CANNOT BE LEFT FOR AN EXTENDED PERIOD OF TIME. EKG LEADS WILL "PULL OFF HER SKIN"    Social History   Socioeconomic History  . Marital status: Married    Spouse name: Not on file  .  Number of children: Not on file  . Years of education: GED  . Highest education level: Not on file  Occupational History  . Occupation: Retired  Scientific laboratory technician  . Financial resource strain: Not on file  . Food insecurity:    Worry: Not on file    Inability: Not on file  . Transportation needs:    Medical: Not on file    Non-medical: Not on file  Tobacco Use  . Smoking status: Former Smoker    Packs/day: 1.00    Types: Cigarettes    Start date: 08/09/1960  . Smokeless tobacco: Never Used  Substance and Sexual Activity  . Alcohol use: No  . Drug use: No  . Sexual activity: Not on file  Lifestyle  . Physical activity:    Days per week: Not on file    Minutes per session: Not on file  . Stress: Not on file  Relationships  . Social connections:    Talks on phone: Not on file    Gets together: Not on file    Attends religious service: Not on file    Active member of club or organization: Not on file    Attends meetings of clubs or organizations: Not on file    Relationship status: Not on file  . Intimate partner violence:    Fear of current or ex partner: Not on file    Emotionally abused: Not on file    Physically abused: Not on file    Forced sexual activity: Not on file  Other Topics Concern  . Not on file  Social History Narrative   Lives at home with husband.   Right-handed.   No daily use of caffeine.    Family History  Problem Relation Age of Onset  .  Diabetes Mother   . Macular degeneration Mother   . Heart attack Father   . Cancer Father        Colon and Prostate Cancer  . Diabetes Sister   . Diabetes Brother   . Heart attack Brother   . Diabetes Brother    The patient does not have a history of early familial atrial fibrillation or other arrhythmias.  ROS- All systems are reviewed and negative except as per the HPI above.  Physical Exam: Vitals:   12/29/17 1335  BP: 118/68  Pulse: (!) 55  Weight: 193 lb (87.5 kg)  Height: 5' 1.5" (1.562 m)    GEN- The patient is well appearing, alert and oriented x 3 today.   Head- normocephalic, atraumatic Eyes-  Sclera clear, conjunctiva pink Ears- hearing intact Oropharynx- clear Neck- supple  Lungs- Clear to ausculation bilaterally, normal work of breathing Heart- Regular rate and rhythm, no murmurs, rubs or gallops  GI- soft, NT, ND, + BS Extremities- no clubbing, cyanosis, or edema MS- no significant deformity or atrophy Skin- no rash or lesion Psych- euthymic mood, full affect Neuro- strength and sensation are intact  Wt Readings from Last 3 Encounters:  12/29/17 193 lb (87.5 kg)  12/13/17 200 lb (90.7 kg)  11/18/17 199 lb 4 oz (90.4 kg)    EKG today demonstrates afib, V rate 55 bpm Echo 2017 demonstrated EF 35-40%  Epic records are reviewed at length today  Assessment and Plan:  1. Permanent atrial fibrillation The patient has minimally symptomatic permanent atrial fibrillation.  The patients CHAD2VASC score is 5.  she is  appropriately anticoagulated at this time. The patient is adequately rate controlled with metoprolol.  Symptomatic bradycardia has  improved.  I have offered to reduce her metoprolol further to 12.5mg  BID.  She is not interesting in making this change today.  She can take additional metoprolol for tachypalpitations prn.  No changes at this time  2. Morbid obesity As above, lifestyle modification was discussed at length including regular  exercise and weight reduction.  3. Bradycardia Improved with reduced metoprolol Could reduce metoprolol further if needed (she does not wish to make changes today) No indication for pacing at this time.   Return to see Butch Penny in 3 months  Thompson Grayer, MD 12/29/2017 2:27 PM

## 2017-12-29 NOTE — Patient Instructions (Signed)
If heart rate becomes elevated over 100 can take extra 1/2 tablet of metoprolol

## 2018-03-23 ENCOUNTER — Other Ambulatory Visit: Payer: Self-pay | Admitting: Cardiovascular Disease

## 2018-03-25 ENCOUNTER — Other Ambulatory Visit: Payer: Self-pay | Admitting: Cardiology

## 2018-03-25 NOTE — Telephone Encounter (Signed)
Pt of Dr. Gwenlyn Found requesting refill on medication. Please address.

## 2018-03-29 ENCOUNTER — Encounter (HOSPITAL_COMMUNITY): Payer: Self-pay | Admitting: Nurse Practitioner

## 2018-03-29 ENCOUNTER — Ambulatory Visit (HOSPITAL_COMMUNITY)
Admission: RE | Admit: 2018-03-29 | Discharge: 2018-03-29 | Disposition: A | Discharge status: Home / Self Care | Payer: Medicare Other | Source: Ambulatory Visit | Attending: Nurse Practitioner | Admitting: Nurse Practitioner

## 2018-03-29 VITALS — BP 108/64 | HR 54 | Ht 61.5 in | Wt 199.0 lb

## 2018-03-29 DIAGNOSIS — Z87442 Personal history of urinary calculi: Secondary | ICD-10-CM | POA: Diagnosis not present

## 2018-03-29 DIAGNOSIS — E785 Hyperlipidemia, unspecified: Secondary | ICD-10-CM | POA: Insufficient documentation

## 2018-03-29 DIAGNOSIS — I482 Chronic atrial fibrillation: Secondary | ICD-10-CM

## 2018-03-29 DIAGNOSIS — K219 Gastro-esophageal reflux disease without esophagitis: Secondary | ICD-10-CM | POA: Diagnosis not present

## 2018-03-29 DIAGNOSIS — I5042 Chronic combined systolic (congestive) and diastolic (congestive) heart failure: Secondary | ICD-10-CM | POA: Insufficient documentation

## 2018-03-29 DIAGNOSIS — Z853 Personal history of malignant neoplasm of breast: Secondary | ICD-10-CM | POA: Diagnosis not present

## 2018-03-29 DIAGNOSIS — Z881 Allergy status to other antibiotic agents status: Secondary | ICD-10-CM | POA: Insufficient documentation

## 2018-03-29 DIAGNOSIS — G4730 Sleep apnea, unspecified: Secondary | ICD-10-CM | POA: Insufficient documentation

## 2018-03-29 DIAGNOSIS — H3530 Unspecified macular degeneration: Secondary | ICD-10-CM | POA: Diagnosis not present

## 2018-03-29 DIAGNOSIS — F419 Anxiety disorder, unspecified: Secondary | ICD-10-CM | POA: Diagnosis not present

## 2018-03-29 DIAGNOSIS — I110 Hypertensive heart disease with heart failure: Secondary | ICD-10-CM | POA: Insufficient documentation

## 2018-03-29 DIAGNOSIS — Z9011 Acquired absence of right breast and nipple: Secondary | ICD-10-CM | POA: Diagnosis not present

## 2018-03-29 DIAGNOSIS — R001 Bradycardia, unspecified: Secondary | ICD-10-CM | POA: Insufficient documentation

## 2018-03-29 DIAGNOSIS — H409 Unspecified glaucoma: Secondary | ICD-10-CM | POA: Diagnosis not present

## 2018-03-29 DIAGNOSIS — Z87891 Personal history of nicotine dependence: Secondary | ICD-10-CM | POA: Insufficient documentation

## 2018-03-29 DIAGNOSIS — R9431 Abnormal electrocardiogram [ECG] [EKG]: Secondary | ICD-10-CM | POA: Diagnosis not present

## 2018-03-29 DIAGNOSIS — Z79899 Other long term (current) drug therapy: Secondary | ICD-10-CM | POA: Diagnosis not present

## 2018-03-29 DIAGNOSIS — I4821 Permanent atrial fibrillation: Secondary | ICD-10-CM

## 2018-03-29 DIAGNOSIS — Z888 Allergy status to other drugs, medicaments and biological substances status: Secondary | ICD-10-CM | POA: Insufficient documentation

## 2018-03-29 NOTE — Progress Notes (Signed)
Primary Care Physician: [name], MD Primary Cardiologist: Dr Sherol Dade is a 82 y.o. female with a history of permanent atrial fibrillation who presents for f/u in the [facility].  She recently was seen  with symptomatic bradycardia.  Her metoprolol was reduced.  She has done very well since that time.  No new concerns today.  Energy is better. Mild dizziness with quick position changes. Remains on metoprolol 12.5 mg bid. She has LV dysfunction with EF of 35%.  Today, she denies symptoms of palpitations, chest pain, shortness of breath, orthopnea, PND, lower extremity edema, dizziness, presyncope, syncope, snoring, daytime somnolence, bleeding, or neurologic sequela. The patient is tolerating medications without difficulties and is otherwise without complaint today.    Atrial Fibrillation Risk Factors:  she does not have symptoms or diagnosis of sleep apnea.  she does not have a history of rheumatic fever.  she does not have a history of alcohol use.  she has a BMI of Body mass index is 36.99 kg/m.[name] Filed Weights   03/29/18 1416  Weight: 199 lb (90.3 kg)     Atrial Fibrillation Management history:    Previous ablations: none  CHADS2VASC score: 5  Anticoagulation history: eliquis   Past Medical History:  Diagnosis Date  . Anxiety   . Breast cancer ([place])   . Cataracts, bilateral   . Chronic combined systolic and diastolic CHF (congestive heart failure) ([place])    a. echo 11/2015: EF 45-50%  b. echo 08/2016: EF 35-40%, diffuse HK, akinesis of mid inferoseptal and apical septal myocardium, mild AR, mild MR, LA moderately dilated.  . Complication of anesthesia   . GERD (gastroesophageal reflux disease)   . Glaucoma   . History of kidney stones    HISTORY OF  . Hyperlipidemia   . Hypertension   . Macular degeneration   . Nodule of right lung   . Obesity   . Permanent atrial fibrillation ([place]) 2017      . PONV (postoperative  nausea and vomiting)   . Sleep apnea   . Visual hallucination    Past Surgical History:  Procedure Laterality Date  . CATARACT EXTRACTION, BILATERAL    . CESAREAN SECTION    . MASTECTOMY Right 1997 ?  [name] TOTAL KNEE ARTHROPLASTY     x2    Current Outpatient Medications  Medication Sig Dispense Refill  . acetaminophen (TYLENOL) 500 MG tablet Take 500 mg by mouth every 6 (six) hours as needed for moderate pain.     [name] amLODipine (NORVASC) 5 MG tablet Take 1 tablet (5 mg total) by mouth daily. 30 tablet 3  . cetirizine (ZYRTEC) 10 MG tablet Take 10 mg by mouth daily as needed for allergies.     . Cholecalciferol 5000 units TABS Take 5,000 Units by mouth every evening.    . dorzolamide-timolol (COSOPT) 22.3-6.8 MG/ML ophthalmic solution Place 1 drop into the right eye 2 (two) times daily.     [name] ELIQUIS 5 MG TABS tablet TAKE 1 TABLET BY MOUTH TWICE A DAY 180 tablet 1  . furosemide (LASIX) 40 MG tablet TAKE 1 TABLET BY MOUTH EVERY DAY (Patient taking differently: Take 40 mg by mouth once a day) 90 tablet 3  . hydrALAZINE (APRESOLINE) 25 MG tablet TAKE 1 TABLET BY MOUTH THREE TIMES A DAY 270 tablet 1  . isosorbide mononitrate (IMDUR) 30 MG 24 hr tablet Take 0.5 tablets (15 mg total) by mouth daily. 45 tablet 1  .  latanoprost (XALATAN) 0.005 % ophthalmic solution Place 1 drop into both eyes at bedtime.    [name] levothyroxine (SYNTHROID, LEVOTHROID) 88 MCG tablet Take 88 mcg by mouth daily before breakfast.    . meclizine (ANTIVERT) 25 MG tablet Take 25 mg by mouth at bedtime.     . metoprolol tartrate (LOPRESSOR) 25 MG tablet Take 0.5 tablets (12.5 mg total) by mouth 2 (two) times daily. 60 tablet 6  . Multiple Vitamin (MULTIVITAMIN WITH MINERALS) TABS tablet Take 1 tablet by mouth daily.    . Multiple Vitamins-Minerals (PRESERVISION AREDS 2) CAPS Take 1 capsule by mouth 2 (two) times daily.    [name] omeprazole (PRILOSEC) 20 MG capsule Take 20 mg by mouth at bedtime.     . potassium chloride (KLOR-CON)  20 MEQ packet Take 20 mEq by mouth 2 (two) times daily. 60 packet 6  . prednisoLONE acetate (PRED FORTE) 1 % ophthalmic suspension Place 1 drop into the left eye 2 (two) times daily.    . QUEtiapine (SEROQUEL) 50 MG tablet Take 50 mg by mouth at bedtime.    . rosuvastatin (CRESTOR) 20 MG tablet Take 20 mg by mouth every evening.    . traZODone (DESYREL) 50 MG tablet Take 50 mg by mouth at bedtime.    . brimonidine (ALPHAGAN) 0.2 % ophthalmic solution Place 2 drops into the right eye daily.     . nitroGLYCERIN (NITROSTAT) 0.4 MG SL tablet Place 1 tablet (0.4 mg total) under the tongue every 5 (five) minutes as needed for chest pain. 25 tablet 6   No current facility-administered medications for this encounter.     Allergies  Allergen Reactions  . Amitriptyline Other (See Comments)    Weakness   . Edarbi [Azilsartan] Other (See Comments)    Lupus  . Morphine And Related Other (See Comments)    "MAKES ME CRAZY"  . Amlodipine Other (See Comments)    Headache   . Atenolol Other (See Comments)    "decreases blood pressure"  . Codeine Nausea Only  . Doxazosin Other (See Comments)    Leg pain  . Dyazide [Hydrochlorothiazide W-Triamterene] Itching  . Erythromycin Diarrhea and Other (See Comments)    Also cramping  . Labetalol Other (See Comments)    Bradycardia   . Levatol [Penbutolol Sulfate] Swelling    Eye swelling  . Losartan Other (See Comments)    Headache   . Monopril [Fosinopril] Hives  . Contrast Media [Iodinated Diagnostic Agents]   . Ace Inhibitors Cough  . Ampicillin Rash  . Belviq [Lorcaserin Hcl] Itching  . Butrans [Buprenorphine] Rash  . Clotrimazole-Betamethasone Rash  . Elocon [Mometasone Furoate] Rash  . Latex Rash    " IF ON ME FOR OVER 24 HOURS"  . Penicillins Itching and Rash    Has patient had a PCN reaction causing immediate rash, facial/tongue/throat swelling, SOB or lightheadedness with hypotension: Yes Has patient had a PCN reaction causing severe  rash involving mucus membranes or skin necrosis: Unknown Has patient had a PCN reaction that required hospitalization: Unknown Has patient had a PCN reaction occurring within the last 10 years: No If all of the above answers are "NO", then may proceed with Cephalosporin use.   [name] Propranolol Itching  . Tape Rash and Other (See Comments)    Does okay with paper tape, but IT CANNOT BE LEFT FOR AN EXTENDED PERIOD OF TIME. EKG LEADS WILL "PULL OFF HER SKIN"    Social History   Socioeconomic History  . Marital  status: Married    Spouse name: Not on file  . Number of children: Not on file  . Years of education: GED  . Highest education level: Not on file  Occupational History  . Occupation: Retired  Scientific laboratory technician  . Financial resource strain: Not on file  . Food insecurity:    Worry: Not on file    Inability: Not on file  . Transportation needs:    Medical: Not on file    Non-medical: Not on file  Tobacco Use  . Smoking status: Former Smoker    Packs/day: 1.00    Types: Cigarettes    Start date: 08/09/1960  . Smokeless tobacco: Never Used  Substance and Sexual Activity  . Alcohol use: No  . Drug use: No  . Sexual activity: Not on file  Lifestyle  . Physical activity:    Days per week: Not on file    Minutes per session: Not on file  . Stress: Not on file  Relationships  . Social connections:    Talks on phone: Not on file    Gets together: Not on file    Attends religious service: Not on file    Active member of club or organization: Not on file    Attends meetings of clubs or organizations: Not on file    Relationship status: Not on file  . Intimate partner violence:    Fear of current or ex partner: Not on file    Emotionally abused: Not on file    Physically abused: Not on file    Forced sexual activity: Not on file  Other Topics Concern  . Not on file  Social History Narrative   Lives at home with husband.   Right-handed.   No daily use of caffeine.     Family History  Problem Relation Age of Onset  . Diabetes Mother   . Macular degeneration Mother   . Heart attack Father   . Cancer Father        Colon and Prostate Cancer  . Diabetes Sister   . Diabetes Brother   . Heart attack Brother   . Diabetes Brother    The patient does not have a history of early familial atrial fibrillation or other arrhythmias.  ROS- All systems are reviewed and negative except as per the HPI above.  Physical Exam: Vitals:   03/29/18 1416  BP: 108/64  Pulse: (!) 54  Weight: 199 lb (90.3 kg)  Height: 5' 1.5" (1.562 m)    GEN- The patient is well appearing, alert and oriented x 3 today.   Head- normocephalic, atraumatic Eyes-  Sclera clear, conjunctiva pink Ears- hearing intact Oropharynx- clear Neck- supple  Lungs- Clear to ausculation bilaterally, normal work of breathing Heart- Regular rate and rhythm, no murmurs, rubs or gallops  GI- soft, NT, ND, + BS Extremities- no clubbing, cyanosis, or edema MS- no significant deformity or atrophy Skin- no rash or lesion Psych- euthymic mood, full affect Neuro- strength and sensation are intact  Wt Readings from Last 3 Encounters:  03/29/18 199 lb (90.3 kg)  12/29/17 193 lb (87.5 kg)  12/13/17 200 lb (90.7 kg)    EKG today demonstrates afib, V rate 54 bpm Echo 2017 demonstrated EF 35-40%  Epic records are reviewed at length today  Assessment and Plan:  1. Permanent atrial fibrillation The patient has minimally symptomatic permanent atrial fibrillation. The patients CHAD2VASC score is 5.  she is  appropriately anticoagulated at this time. The patient is  adequately rate controlled with metoprolol.  Symptomatic bradycardia has improved. She will continue on 12.5 mg bid   No changes at this time  2. Morbid obesity As above, lifestyle modification was discussed at length including regular exercise and weight reduction.  3. Bradycardia Improved with reduced metoprolol Could reduce  metoprolol further if becomes more symptomatic,will leave in place for now with  known LV dysfuction No indication for pacing at this time.   Return to see  Dr. Gwenlyn Found in September Afib clinic as needed    Geroge Baseman. Sha Burling, [facility] [address] [place], [place] [address] [phone]

## 2018-04-13 ENCOUNTER — Other Ambulatory Visit: Payer: Self-pay | Admitting: Cardiovascular Disease

## 2018-04-20 ENCOUNTER — Other Ambulatory Visit: Payer: Self-pay | Admitting: Thoracic Surgery (Cardiothoracic Vascular Surgery)

## 2018-04-20 DIAGNOSIS — R911 Solitary pulmonary nodule: Secondary | ICD-10-CM

## 2018-04-23 ENCOUNTER — Other Ambulatory Visit: Payer: Self-pay | Admitting: Cardiovascular Disease

## 2018-04-25 NOTE — Telephone Encounter (Signed)
Rx sent to pharmacy   

## 2018-05-17 ENCOUNTER — Ambulatory Visit
Admission: RE | Admit: 2018-05-17 | Discharge: 2018-05-17 | Disposition: A | Discharge status: Home / Self Care | Payer: Medicare Other | Source: Ambulatory Visit | Attending: Thoracic Surgery (Cardiothoracic Vascular Surgery) | Admitting: Thoracic Surgery (Cardiothoracic Vascular Surgery)

## 2018-05-17 ENCOUNTER — Encounter: Payer: Self-pay | Admitting: Thoracic Surgery (Cardiothoracic Vascular Surgery)

## 2018-05-17 ENCOUNTER — Ambulatory Visit: Payer: Medicare Other | Admitting: Thoracic Surgery (Cardiothoracic Vascular Surgery)

## 2018-05-17 VITALS — BP 160/94 | HR 55 | Resp 20 | Ht 61.5 in | Wt 200.0 lb

## 2018-05-17 DIAGNOSIS — R911 Solitary pulmonary nodule: Secondary | ICD-10-CM | POA: Diagnosis not present

## 2018-05-17 NOTE — Progress Notes (Deleted)
[facility]  PATIENT: Amy Hahn DOB: Jul 09, 1936   REASON FOR VISIT: Follow-up for mild cognitive impairment HISTORY FROM:    HISTORY OF PRESENT ILLNESS:  2/14/19YYAddie R Hahn is a 82 years old female, seen in refer by her primary care doctor [name] evaluation of hallucinations, initial evaluation was on August 19 2017, she is accompanied by her daughter Nira Conn and her son Lennette Bihari at today's clinical visit.  I reviewed and summarized referring note, she has history of hyperlipidemia, hypertension, hypothyroidism, osteoarthritis, retinal hemorrhage, bilateral cataract, glaucoma, right breast cancer, status post lobectomy in 2001, kidney stone, thyroid nodule, 40-60% stenosis at left internal carotid artery,  Around February 2018, she began to have visual hallucinations, at evening time, she often sees spiders at the ceiling, later she had gradual worsening hallucinations, she thought her sister was in bed with her, she also saw people in her backyard,  There was no auditory hallucinations, she tends to have vivid dreams, there was no significant memory loss, there was also personality change, she became more stubborn, less kind, also become less physically active, lost interest in reading, There was no family history of dementia.  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  Mild generalized atrophy.  UPDATE Nov 18 2017: Laboratory evaluations in January 2019, CMP, creatinine of 1.16, potassium of 3.1, previous was 2.7, A1c was 6.1, LDL was 37, cholesterol was 106, vitamin D level was 56 normal CBC,Hg 11.6.  She has frequent diarrhea, is very sedentary, deconditioned  REVIEW OF SYSTEMS: Full 14 system review of systems performed and notable only for those listed, all others are neg:  Constitutional: neg  Cardiovascular: neg Ear/Nose/Throat: neg  Skin:  neg Eyes: neg Respiratory: neg Gastroitestinal: neg  Hematology/Lymphatic: neg  Endocrine: neg Musculoskeletal:neg Allergy/Immunology: neg Neurological: neg Psychiatric: neg Sleep : neg   ALLERGIES: Allergies  Allergen Reactions  . Amitriptyline Other (See Comments)    Weakness   . Edarbi [Azilsartan] Other (See Comments)    Lupus  . Morphine And Related Other (See Comments)    "MAKES ME CRAZY"  . Amlodipine Other (See Comments)    Headache   . Atenolol Other (See Comments)    "decreases blood pressure"  . Codeine Nausea Only  . Doxazosin Other (See Comments)    Leg pain  . Dyazide [Hydrochlorothiazide W-Triamterene] Itching  . Erythromycin Diarrhea and Other (See Comments)    Also cramping  . Labetalol Other (See Comments)    Bradycardia   . Levatol [Penbutolol Sulfate] Swelling    Eye swelling  . Losartan Other (See Comments)    Headache   . Monopril [Fosinopril] Hives  . Contrast Media [Iodinated Diagnostic Agents]   . Ace Inhibitors Cough  . Ampicillin Rash  . Belviq [Lorcaserin Hcl] Itching  . Butrans [Buprenorphine] Rash  . Clotrimazole-Betamethasone Rash  . Elocon [Mometasone Furoate] Rash  . Latex Rash    " IF ON ME FOR OVER 24 HOURS"  . Penicillins Itching and Rash    Has patient had a PCN reaction causing immediate rash, facial/tongue/throat swelling, SOB or lightheadedness with hypotension: Yes Has patient had a PCN reaction causing severe rash involving mucus membranes or skin necrosis: Unknown Has patient had a PCN reaction that required hospitalization: Unknown Has patient had a PCN reaction occurring within the last 10 years: No If all of the above answers are "NO", then may proceed with Cephalosporin use.   [name]  Propranolol Itching  . Tape Rash and Other (See Comments)    Does okay with paper tape, but IT CANNOT BE LEFT FOR AN EXTENDED PERIOD OF TIME. EKG LEADS WILL "PULL OFF HER SKIN"    HOME MEDICATIONS: Outpatient Medications Prior to  Visit  Medication Sig Dispense Refill  . acetaminophen (TYLENOL) 500 MG tablet Take 500 mg by mouth every 6 (six) hours as needed for moderate pain.     [name] amLODipine (NORVASC) 5 MG tablet Take 1 tablet (5 mg total) by mouth daily. 30 tablet 3  . brimonidine (ALPHAGAN) 0.2 % ophthalmic solution Place 2 drops into the right eye daily.     . cetirizine (ZYRTEC) 10 MG tablet Take 10 mg by mouth daily as needed for allergies.     . Cholecalciferol 5000 units TABS Take 5,000 Units by mouth every evening.    . dorzolamide-timolol (COSOPT) 22.3-6.8 MG/ML ophthalmic solution Place 1 drop into the right eye 2 (two) times daily.     [name] ELIQUIS 5 MG TABS tablet TAKE 1 TABLET BY MOUTH TWICE A DAY 180 tablet 1  . furosemide (LASIX) 40 MG tablet Take 1 tablet (40 mg total) by mouth daily. Please call to make appointment for further refills. 90 tablet 0  . hydrALAZINE (APRESOLINE) 25 MG tablet TAKE 1 TABLET BY MOUTH THREE TIMES A DAY 270 tablet 2  . isosorbide mononitrate (IMDUR) 30 MG 24 hr tablet Take 0.5 tablets (15 mg total) by mouth daily. 45 tablet 1  . latanoprost (XALATAN) 0.005 % ophthalmic solution Place 1 drop into both eyes at bedtime.    [name] levothyroxine (SYNTHROID, LEVOTHROID) 88 MCG tablet Take 88 mcg by mouth daily before breakfast.    . meclizine (ANTIVERT) 25 MG tablet Take 25 mg by mouth at bedtime.     . metoprolol tartrate (LOPRESSOR) 25 MG tablet Take 0.5 tablets (12.5 mg total) by mouth 2 (two) times daily. 60 tablet 6  . Multiple Vitamin (MULTIVITAMIN WITH MINERALS) TABS tablet Take 1 tablet by mouth daily.    . Multiple Vitamins-Minerals (PRESERVISION AREDS 2) CAPS Take 1 capsule by mouth 2 (two) times daily.    . nitroGLYCERIN (NITROSTAT) 0.4 MG SL tablet Place 1 tablet (0.4 mg total) under the tongue every 5 (five) minutes as needed for chest pain. 25 tablet 6  . omeprazole (PRILOSEC) 20 MG capsule Take 20 mg by mouth at bedtime.     . potassium chloride (KLOR-CON) 20 MEQ packet Take 20  mEq by mouth 2 (two) times daily. 60 packet 6  . prednisoLONE acetate (PRED FORTE) 1 % ophthalmic suspension Place 1 drop into the left eye 2 (two) times daily.    . QUEtiapine (SEROQUEL) 50 MG tablet Take 50 mg by mouth at bedtime.    . rosuvastatin (CRESTOR) 20 MG tablet Take 20 mg by mouth every evening.    . traZODone (DESYREL) 50 MG tablet Take 50 mg by mouth at bedtime.     No facility-administered medications prior to visit.     PAST MEDICAL HISTORY: Past Medical History:  Diagnosis Date  . Anxiety   . Breast cancer ([place])   . Cataracts, bilateral   . Chronic combined systolic and diastolic CHF (congestive heart failure) ([place])    a. echo 11/2015: EF 45-50%  b. echo 08/2016: EF 35-40%, diffuse HK, akinesis of mid inferoseptal and apical septal myocardium, mild AR, mild MR, LA moderately dilated.  . Complication of anesthesia   . GERD (gastroesophageal reflux disease)   .  Glaucoma   . History of kidney stones    HISTORY OF  . Hyperlipidemia   . Hypertension   . Macular degeneration   . Nodule of right lung   . Obesity   . Permanent atrial fibrillation ([place]) 2017      . PONV (postoperative nausea and vomiting)   . Sleep apnea   . Visual hallucination     PAST SURGICAL HISTORY: Past Surgical History:  Procedure Laterality Date  . CATARACT EXTRACTION, BILATERAL    . CESAREAN SECTION    . MASTECTOMY Right 1997 ?  [name] TOTAL KNEE ARTHROPLASTY     x2    FAMILY HISTORY: Family History  Problem Relation Age of Onset  . Diabetes Mother   . Macular degeneration Mother   . Heart attack Father   . Cancer Father        Colon and Prostate Cancer  . Diabetes Sister   . Diabetes Brother   . Heart attack Brother   . Diabetes Brother     SOCIAL HISTORY: Social History   Socioeconomic History  . Marital status: Married    Spouse name: Not on file  . Number of children: Not on file  . Years of education: GED  . Highest education level: Not on file  Occupational History   . Occupation: Retired  Scientific laboratory technician  . Financial resource strain: Not on file  . Food insecurity:    Worry: Not on file    Inability: Not on file  . Transportation needs:    Medical: Not on file    Non-medical: Not on file  Tobacco Use  . Smoking status: Former Smoker    Packs/day: 1.00    Types: Cigarettes    Start date: 08/09/1960  . Smokeless tobacco: Never Used  Substance and Sexual Activity  . Alcohol use: No  . Drug use: No  . Sexual activity: Not on file  Lifestyle  . Physical activity:    Days per week: Not on file    Minutes per session: Not on file  . Stress: Not on file  Relationships  . Social connections:    Talks on phone: Not on file    Gets together: Not on file    Attends religious service: Not on file    Active member of club or organization: Not on file    Attends meetings of clubs or organizations: Not on file    Relationship status: Not on file  . Intimate partner violence:    Fear of current or ex partner: Not on file    Emotionally abused: Not on file    Physically abused: Not on file    Forced sexual activity: Not on file  Other Topics Concern  . Not on file  Social History Narrative   Lives at home with husband.   Right-handed.   No daily use of caffeine.     PHYSICAL EXAM  There were no vitals filed for this visit. There is no height or weight on file to calculate BMI.  Generalized: Well developed, in no acute distress  Head: normocephalic and atraumatic,. Oropharynx benign  Neck: Supple, no carotid bruits  Cardiac: Regular rate rhythm, no murmur  Musculoskeletal: No deformity   Neurological examination   Mentation: Alert oriented to time, place, history taking. Attention span and concentration appropriate. Recent and remote memory intact.  Follows all commands speech and language fluent.   Cranial nerve II-XII: Fundoscopic exam reveals sharp disc margins.Pupils were equal round reactive to  light extraocular movements were full,  visual field were full on confrontational test. Facial sensation and strength were normal. hearing was intact to finger rubbing bilaterally. Uvula tongue midline. head turning and shoulder shrug were normal and symmetric.Tongue protrusion into cheek strength was normal. Motor: normal bulk and tone, full strength in the BUE, BLE, fine finger movements normal, no pronator drift. No focal weakness Sensory: normal and symmetric to light touch, pinprick, and  Vibration, proprioception  Coordination: finger-nose-finger, heel-to-shin bilaterally, no dysmetria Reflexes: Brachioradialis 2/2, biceps 2/2, triceps 2/2, patellar 2/2, Achilles 2/2, plantar responses were flexor bilaterally. Gait and Station: Rising up from seated position without assistance, normal stance,  moderate stride, good arm swing, smooth turning, able to perform tiptoe, and heel walking without difficulty. Tandem gait is steady  DIAGNOSTIC DATA (LABS, IMAGING, TESTING) - I reviewed patient records, labs, notes, testing and imaging myself where available.  Lab Results  Component Value Date   WBC 6.9 06/03/2017   HGB 11.5 (L) 06/03/2017   HCT 36.8 06/03/2017   MCV 102.5 (H) 06/03/2017   PLT 250 06/03/2017      Component Value Date/Time   NA 139 06/03/2017 1737   NA 144 04/22/2015 1351   K 3.2 (L) 06/03/2017 1737   K 3.9 04/22/2015 1351   CL 103 06/03/2017 1737   CL 108 (H) 07/11/2012 1400   CO2 26 06/03/2017 1737   CO2 26 04/22/2015 1351   GLUCOSE 152 (H) 06/03/2017 1737   GLUCOSE 92 04/22/2015 1351   GLUCOSE 119 (H) 07/11/2012 1400   BUN 16 06/03/2017 1737   BUN 22.4 04/22/2015 1351   CREATININE 1.26 (H) 06/03/2017 1737   CREATININE 1.38 (H) 02/15/2017 1429   CREATININE 1.0 04/22/2015 1351   CALCIUM 9.7 06/03/2017 1737   CALCIUM 10.4 04/22/2015 1351   PROT 6.8 06/03/2017 1737   PROT 6.8 04/22/2015 1351   ALBUMIN 3.3 (L) 06/03/2017 1737   ALBUMIN 3.5 04/22/2015 1351   AST 25 06/03/2017 1737   AST 23 04/22/2015  1351   ALT 12 (L) 06/03/2017 1737   ALT 16 04/22/2015 1351   ALKPHOS 70 06/03/2017 1737   ALKPHOS 86 04/22/2015 1351   BILITOT 0.7 06/03/2017 1737   BILITOT 0.42 04/22/2015 1351   GFRNONAA 39 (L) 06/03/2017 1737   GFRAA 46 (L) 06/03/2017 1737   No results found for: CHOL, HDL, LDLCALC, LDLDIRECT, TRIG, CHOLHDL No results found for: HGBA1C Lab Results  Component Value Date   VITAMINB12 471 08/19/2017   Lab Results  Component Value Date   TSH 3.810 08/19/2017    ***  ASSESSMENT AND PLAN  82 y.o. year old female  has a past medical history of Anxiety, Breast cancer ([place]), Cataracts, bilateral, Chronic combined systolic and diastolic CHF (congestive heart failure) ([place]), Complication of anesthesia, GERD (gastroesophageal reflux disease), Glaucoma, History of kidney stones, Hyperlipidemia, Hypertension, Macular degeneration, Nodule of right lung, Obesity, Permanent atrial fibrillation ([place]) (2017), PONV (postoperative nausea and vomiting), Sleep apnea, and Visual hallucination. here with ***  Dakari KHALIYAH NORTHROP is a 82 y.o. female   Visual hallucinations, mild memory loss,              most consistent with central nervous system degenerative disorders, including possible Lewy body dementia             She is severely deconditioned,             Stop Aricept 10 mg daily, complains of frequent diarrhea, Aricept might contribute,  Referred to neuropsychiatric evaluations Gait abnormality             Refer her to physical therapy  Dennie Bible, [facility], [facility], APRN  [facility] [address], [place], [place] [address] [phone]

## 2018-05-17 NOTE — Progress Notes (Signed)
[address]       [place] [address]             [phone]    HPI: Amy Hahn returns for a scheduled one-year follow-up  Amy Hahn is an 82 year old woman with a history of hypertension, hyperlipidemia, atrial fibrillation, chronic combined systolic and diastolic congestive heart failure, obesity, arthritis, sleep apnea, breast cancer, and anxiety.  She also has a right lower lobe lung nodule that have been following since 2013.  There is been minimal if any change in size over time.  Her ascending aorta is enlarged at 4 cm and she also has an enlarged pulmonary artery.  She denies any chest pain, pressure, or tightness.  She complains of being "old."  She has a lack of energy and fatigues easily.  Her husband says that she just sits in front of the television all day.  She frequently falls asleep throughout the day.  Past Medical History:  Diagnosis Date  . Anxiety   . Breast cancer ([place])   . Cataracts, bilateral   . Chronic combined systolic and diastolic CHF (congestive heart failure) ([place])    a. echo 11/2015: EF 45-50%  b. echo 08/2016: EF 35-40%, diffuse HK, akinesis of mid inferoseptal and apical septal myocardium, mild AR, mild MR, LA moderately dilated.  . Complication of anesthesia   . GERD (gastroesophageal reflux disease)   . Glaucoma   . History of kidney stones    HISTORY OF  . Hyperlipidemia   . Hypertension   . Macular degeneration   . Nodule of right lung   . Obesity   . Permanent atrial fibrillation ([facility]) 2017      . PONV (postoperative nausea and vomiting)   . Sleep apnea   . Visual hallucination      Current Outpatient Medications  Medication Sig Dispense Refill  . amLODipine (NORVASC) 5 MG tablet Take 1 tablet (5 mg total) by mouth daily. 30 tablet 3  . brimonidine (ALPHAGAN) 0.2 % ophthalmic solution Place 2 drops into the right eye daily.     . cetirizine (ZYRTEC) 10 MG tablet Take 10 mg by mouth daily as needed for allergies.      . Cholecalciferol 5000 units TABS Take 5,000 Units by mouth every evening.    . dorzolamide-timolol (COSOPT) 22.3-6.8 MG/ML ophthalmic solution Place 1 drop into the right eye 2 (two) times daily.     [name] ELIQUIS 5 MG TABS tablet TAKE 1 TABLET BY MOUTH TWICE A DAY 180 tablet 1  . furosemide (LASIX) 40 MG tablet Take 1 tablet (40 mg total) by mouth daily. Please call to make appointment for further refills. 90 tablet 0  . hydrALAZINE (APRESOLINE) 25 MG tablet TAKE 1 TABLET BY MOUTH THREE TIMES A DAY 270 tablet 2  . latanoprost (XALATAN) 0.005 % ophthalmic solution Place 1 drop into both eyes at bedtime.    [name] levothyroxine (SYNTHROID, LEVOTHROID) 88 MCG tablet Take 88 mcg by mouth daily before breakfast.    . meclizine (ANTIVERT) 25 MG tablet Take 25 mg by mouth at bedtime.     . metoprolol tartrate (LOPRESSOR) 25 MG tablet Take 0.5 tablets (12.5 mg total) by mouth 2 (two) times daily. 60 tablet 6  . Multiple Vitamin (MULTIVITAMIN WITH MINERALS) TABS tablet Take 1 tablet by mouth daily.    . Multiple Vitamins-Minerals (PRESERVISION AREDS 2) CAPS Take 1 capsule by mouth 2 (two) times daily.    . nitroGLYCERIN (NITROSTAT)  0.4 MG SL tablet Place 1 tablet (0.4 mg total) under the tongue every 5 (five) minutes as needed for chest pain. 25 tablet 6  . omeprazole (PRILOSEC) 20 MG capsule Take 20 mg by mouth at bedtime.     . prednisoLONE acetate (PRED FORTE) 1 % ophthalmic suspension Place 1 drop into the left eye 2 (two) times daily.    . QUEtiapine (SEROQUEL) 50 MG tablet Take 50 mg by mouth at bedtime.    . rosuvastatin (CRESTOR) 20 MG tablet Take 20 mg by mouth every evening.    . traZODone (DESYREL) 50 MG tablet Take 50 mg by mouth at bedtime.    [name] acetaminophen (TYLENOL) 500 MG tablet Take 500 mg by mouth every 6 (six) hours as needed for moderate pain.     . potassium chloride (KLOR-CON) 20 MEQ packet Take 20 mEq by mouth 2 (two) times daily. (Patient not taking: Reported on 05/17/2018) 60 packet 6    No current facility-administered medications for this visit.     Physical Exam BP (!) 160/94   Pulse (!) 55   Resp 20   Ht 5' 1.5" (1.562 m)   Wt 200 lb (90.7 kg)   SpO2 99% Comment: RA  BMI 37.9 kg/m  82 year old obese woman in no acute distress Alert and oriented x3 with no focal deficits Cardiac regular rate and rhythm with faint systolic murmur Status post right mastectomy Lungs diminished breath sounds at bases bilaterally, no rales or wheezing  Diagnostic Tests: CT CHEST WITHOUT CONTRAST  TECHNIQUE: Multidetector CT imaging of the chest was performed following the standard protocol without IV contrast.  COMPARISON:  CT chest dated June 01, 2017.  FINDINGS: Cardiovascular: Stable cardiomegaly. No pericardial effusion. Unchanged ascending thoracic aortic aneurysm, measuring 4.0 cm. Coronary, aortic arch, and branch vessel atherosclerotic vascular disease. Unchanged marked dilatation of the main pulmonary artery measuring up to 4.9 cm.  Mediastinum/Nodes: No enlarged mediastinal or axillary lymph nodes. Prior right axillary lymph node dissection. Unchanged thyroid goiter. The trachea and esophagus demonstrate no significant findings.  Lungs/Pleura: Stable to slight interval decrease in size of the 9 mm pulmonary nodule in the right lower lobe. This has not significantly changed dating back to 2013. No new or enlarging pulmonary nodule. Unchanged radiation fibrosis in the anterior right upper lobe. Mild mosaic attenuation throughout both lungs. Subsegmental atelectasis at the left lung base. No focal consolidation, pleural effusion, or pneumothorax.  Upper Abdomen: No acute abnormality.  Musculoskeletal: Prior right mastectomy. No acute or suspicious osseous findings.  IMPRESSION: 1. Unchanged ascending thoracic aortic aneurysm, measuring 4.0 cm. Recommend annual imaging followup by CTA or MRA. This recommendation follows 2010  ACCF/AHA/AATS/ACR/ASA/SCA/SCAI/SIR/STS/SVM Guidelines for the Diagnosis and Management of Patients with Thoracic Aortic Disease. Circulation. 2010; 121: G295-M841 2. Stable to slight interval decrease in size of the 9 mm pulmonary nodule in the right lower lobe. This has not significantly changed dating back to 2013, consistent with a benign nodule. No further follow-up is required. 3.  Aortic atherosclerosis (ICD10-I70.0).   Electronically Signed   By: Titus Dubin M.D.   On: 05/17/2018 13:58 I personally reviewed the CT images and concur with the findings noted above  Impression: Amy Hahn is an 82 year old woman with numerous medical issues who is being followed for a borderline ascending aortic aneurysm.  That is unchanged at 4 cm.  Hypertension-blood pressure elevated at 324 systolic today.  She is been much higher than that in the past.  Given her numerous medications and  extensive list of allergies I will defer any blood pressure medication changes to her primary care.  Pulmonary artery aneurysm-asymptomatic had an echocardiogram 2017 showed no evidence of coronary hypertension or right heart failure.  Her husband is very concerned about her unwillingness to be active.  I encouraged her to increase her physical activities.  Certainly she has multiple medical issues that could affect her ability to be active.  I asked them to address that with her primary care physician as well.  Lung nodule-stable over 6 years.  Consistent with benign nodule.  Plan: Return in 1 year with CT chest  Melrose Nakayama, MD [facility] [phone]

## 2018-05-18 ENCOUNTER — Ambulatory Visit: Payer: Medicare Other | Admitting: Nurse Practitioner

## 2018-06-22 ENCOUNTER — Other Ambulatory Visit: Payer: Self-pay | Admitting: Cardiology

## 2018-06-23 ENCOUNTER — Other Ambulatory Visit: Payer: Self-pay | Admitting: Cardiovascular Disease

## 2018-07-25 ENCOUNTER — Other Ambulatory Visit: Payer: Self-pay | Admitting: Cardiovascular Disease

## 2018-07-27 ENCOUNTER — Ambulatory Visit: Payer: Medicare Other | Admitting: Neurology

## 2018-07-27 ENCOUNTER — Encounter: Payer: Self-pay | Admitting: Neurology

## 2018-07-27 VITALS — BP 156/80 | HR 59 | Ht 61.5 in | Wt 204.5 lb

## 2018-07-27 DIAGNOSIS — F0391 Unspecified dementia with behavioral disturbance: Secondary | ICD-10-CM

## 2018-07-27 NOTE — Progress Notes (Signed)
PATIENT: Amy Hahn DOB: 1935-12-15  Chief Complaint  Patient presents with  . New Patient (Initial Visit)    RM EMG 4 with daughter. Last seen 11/18/17. Here to f/u on memory problems. She had appt with neuropsych center on 06/15/18. Daughter brought report for MD to review.   [name] PCP    [name], MD  . Gait Problem    Ambulates with quad cane. Unsteady while walking     HISTORICAL  Amy Hahn is a 82 years old female, seen in refer by her primary care doctor [name] evaluation of hallucinations, initial evaluation was on August 19 2017, she is accompanied by her daughter Nira Conn and her son Lennette Bihari at today's clinical visit.  I reviewed and summarized the referring note, she has history of hyperlipidemia, hypertension, hypothyroidism, osteoarthritis, retinal hemorrhage, bilateral cataract, glaucoma, right breast cancer, status post lobectomy in 2001, kidney stone, thyroid nodule, 40-60% stenosis at left internal carotid artery,  Around February 2018, she began to have visual hallucinations, at evening time, she often sees spiders at the ceiling, later she had gradual worsening hallucinations, she thought her sister was in bed with her, she also saw people in her backyard,  There was no auditory hallucinations, she tends to have vivid dreams, there was no significant memory loss, there was also personality change, she became more stubborn, less kind, also become less physically active, lost interest in reading, There was no family history of dementia.  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  Mild generalized atrophy.  UPDATE Nov 18 2017: Laboratory evaluations in January 2019, CMP, creatinine of 1.16, potassium of 3.1, previous was 2.7, A1c was 6.1, LDL was 37, cholesterol was 106, vitamin D level was 56 normal CBC,Hg 11.6.  She has frequent diarrhea, is very sedentary,  deconditioned.  UPDATE Jul 27 2018: She is with her daughter Nira Conn at visit. She complains of SOB, especially with exertion. She was noted to have more memory loss, she still has visual hallucination, somebody walking by her house, come into the house, it does upset her, she is taking Seroquel 50 mg at nighttime, trazodone 50 mg, she has frequent awakening at nighttime, but denies REM sleep disorder.  I was able to review her neuropsychiatric evaluation by Dr. Marlane Hatcher on Sept 30 2019, she has poor vision due to glaucoma, macular degeneration, also complains of trouble balancing,   overall neurocognitive profile included impairment in most domains, with exception in visual-spatial functioning, confronting naming, and memory retention, the documented deficit worked as a level consistent with diagnosis of major neurocognitive disorder, the pattern of her cognitive issues, deficit in executive functioning, attention, processing speed, and verbal fluency is most consistent with patterns seen in cerebrovascular dementia, however given her other symptoms, sleep disturbance, fluctuating symptoms, visual hallucinations, there was concern of dementia with Lewy body or mixed dementia presentation, there was also evidence of moderate to severe depression, mild anxiety, mood symptoms, which could also exaggerated cognitive changes,  REVIEW OF SYSTEMS: Full 14 system review of systems performed and notable only for as above ALLERGIES: Allergies  Allergen Reactions  . Amitriptyline Other (See Comments)    Weakness   . Edarbi [Azilsartan] Other (See Comments)    Lupus  . Morphine And Related Other (See Comments)    "MAKES ME CRAZY"  . Amlodipine Other (See Comments)    Headache   . Atenolol Other (See Comments)    "  decreases blood pressure"  . Codeine Nausea Only  . Doxazosin Other (See Comments)    Leg pain  . Dyazide [Hydrochlorothiazide W-Triamterene] Itching  . Erythromycin Diarrhea and  Other (See Comments)    Also cramping  . Labetalol Other (See Comments)    Bradycardia   . Levatol [Penbutolol Sulfate] Swelling    Eye swelling  . Losartan Other (See Comments)    Headache   . Monopril [Fosinopril] Hives  . Contrast Media [Iodinated Diagnostic Agents]   . Ace Inhibitors Cough  . Ampicillin Rash  . Belviq [Lorcaserin Hcl] Itching  . Butrans [Buprenorphine] Rash  . Clotrimazole-Betamethasone Rash  . Elocon [Mometasone Furoate] Rash  . Latex Rash    " IF ON ME FOR OVER 24 HOURS"  . Penicillins Itching and Rash    Has patient had a PCN reaction causing immediate rash, facial/tongue/throat swelling, SOB or lightheadedness with hypotension: Yes Has patient had a PCN reaction causing severe rash involving mucus membranes or skin necrosis: Unknown Has patient had a PCN reaction that required hospitalization: Unknown Has patient had a PCN reaction occurring within the last 10 years: No If all of the above answers are "NO", then may proceed with Cephalosporin use.   [name] Propranolol Itching  . Tape Rash and Other (See Comments)    Does okay with paper tape, but IT CANNOT BE LEFT FOR AN EXTENDED PERIOD OF TIME. EKG LEADS WILL "PULL OFF HER SKIN"    HOME MEDICATIONS: Current Outpatient Medications  Medication Sig Dispense Refill  . acetaminophen (TYLENOL) 500 MG tablet Take 500 mg by mouth every 6 (six) hours as needed for moderate pain.     [name] amLODipine (NORVASC) 5 MG tablet TAKE 1 TABLET BY MOUTH EVERY DAY 90 tablet 0  . brimonidine (ALPHAGAN) 0.2 % ophthalmic solution Place 2 drops into the right eye daily.     . cetirizine (ZYRTEC) 10 MG tablet Take 10 mg by mouth daily as needed for allergies.     . Cholecalciferol 5000 units TABS Take 5,000 Units by mouth every evening.    . dorzolamide-timolol (COSOPT) 22.3-6.8 MG/ML ophthalmic solution Place 1 drop into the right eye 2 (two) times daily.     [name] ELIQUIS 5 MG TABS tablet TAKE 1 TABLET BY MOUTH TWICE A DAY 180 tablet 1   . furosemide (LASIX) 40 MG tablet TAKE 1 TABLET (40 MG TOTAL) BY MOUTH DAILY. PLEASE CALL TO MAKE APPOINTMENT FOR FURTHER REFILLS. 30 tablet 0  . hydrALAZINE (APRESOLINE) 25 MG tablet TAKE 1 TABLET BY MOUTH THREE TIMES A DAY 270 tablet 2  . latanoprost (XALATAN) 0.005 % ophthalmic solution Place 1 drop into both eyes at bedtime.    [name] levothyroxine (SYNTHROID, LEVOTHROID) 88 MCG tablet Take 88 mcg by mouth daily before breakfast.    . meclizine (ANTIVERT) 25 MG tablet Take 25 mg by mouth at bedtime.     . Melatonin 3 MG TABS Take 1 tablet by mouth at bedtime.    . metoprolol tartrate (LOPRESSOR) 25 MG tablet Take 0.5 tablets (12.5 mg total) by mouth 2 (two) times daily. 60 tablet 6  . Multiple Vitamin (MULTIVITAMIN WITH MINERALS) TABS tablet Take 1 tablet by mouth daily.    . Multiple Vitamins-Minerals (PRESERVISION AREDS 2) CAPS Take 1 capsule by mouth 2 (two) times daily.    . nitroGLYCERIN (NITROSTAT) 0.4 MG SL tablet PLACE 1 TABLET (0.4 MG TOTAL) UNDER THE TONGUE EVERY 5 (FIVE) MINUTES AS NEEDED FOR CHEST PAIN. 25 tablet 6  .  omeprazole (PRILOSEC) 20 MG capsule Take 20 mg by mouth at bedtime.     . prednisoLONE acetate (PRED FORTE) 1 % ophthalmic suspension Place 1 drop into the left eye 2 (two) times daily.    . QUEtiapine (SEROQUEL) 50 MG tablet Take 50 mg by mouth at bedtime.    . rosuvastatin (CRESTOR) 20 MG tablet Take 20 mg by mouth every evening.    . traZODone (DESYREL) 50 MG tablet Take 50 mg by mouth at bedtime.     No current facility-administered medications for this visit.     PAST MEDICAL HISTORY: Past Medical History:  Diagnosis Date  . Anxiety   . Breast cancer ([place])   . Cataracts, bilateral   . Chronic combined systolic and diastolic CHF (congestive heart failure) ([place])    a. echo 11/2015: EF 45-50%  b. echo 08/2016: EF 35-40%, diffuse HK, akinesis of mid inferoseptal and apical septal myocardium, mild AR, mild MR, LA moderately dilated.  . Complication of anesthesia    . GERD (gastroesophageal reflux disease)   . Glaucoma   . History of kidney stones    HISTORY OF  . Hyperlipidemia   . Hypertension   . Macular degeneration   . Nodule of right lung   . Obesity   . Permanent atrial fibrillation 2017      . PONV (postoperative nausea and vomiting)   . Sleep apnea   . Visual hallucination     PAST SURGICAL HISTORY: Past Surgical History:  Procedure Laterality Date  . CATARACT EXTRACTION, BILATERAL    . CESAREAN SECTION    . MASTECTOMY Right 1997 ?  [name] TOTAL KNEE ARTHROPLASTY     x2    FAMILY HISTORY: Family History  Problem Relation Age of Onset  . Diabetes Mother   . Macular degeneration Mother   . Heart attack Father   . Cancer Father        Colon and Prostate Cancer  . Diabetes Sister   . Diabetes Brother   . Heart attack Brother   . Diabetes Brother     SOCIAL HISTORY:  Social History   Socioeconomic History  . Marital status: Married    Spouse name: Not on file  . Number of children: Not on file  . Years of education: GED  . Highest education level: Not on file  Occupational History  . Occupation: Retired  Scientific laboratory technician  . Financial resource strain: Not on file  . Food insecurity:    Worry: Not on file    Inability: Not on file  . Transportation needs:    Medical: Not on file    Non-medical: Not on file  Tobacco Use  . Smoking status: Former Smoker    Packs/day: 1.00    Types: Cigarettes    Start date: 08/09/1960  . Smokeless tobacco: Never Used  Substance and Sexual Activity  . Alcohol use: No  . Drug use: No  . Sexual activity: Not on file  Lifestyle  . Physical activity:    Days per week: Not on file    Minutes per session: Not on file  . Stress: Not on file  Relationships  . Social connections:    Talks on phone: Not on file    Gets together: Not on file    Attends religious service: Not on file    Active member of club or organization: Not on file    Attends meetings of clubs or organizations:  Not on file    Relationship  status: Not on file  . Intimate partner violence:    Fear of current or ex partner: Not on file    Emotionally abused: Not on file    Physically abused: Not on file    Forced sexual activity: Not on file  Other Topics Concern  . Not on file  Social History Narrative   Lives at home with husband.   Right-handed.   No daily use of caffeine.     PHYSICAL EXAM   Vitals:   07/27/18 0817  BP: (!) 156/80  Pulse: (!) 59  SpO2: 92%  Weight: 204 lb 8 oz (92.8 kg)  Height: 5' 1.5" (1.562 m)    Not recorded      Body mass index is 38.01 kg/m.  PHYSICAL EXAMNIATION:  Gen: NAD, conversant, well nourised, obese, well groomed                     Cardiovascular: Regular rate rhythm, no peripheral edema, warm, nontender. Eyes: Conjunctivae clear without exudates or hemorrhage Neck: Supple, no carotid bruits. Pulmonary: Clear to auscultation bilaterally   NEUROLOGICAL EXAM:  MMSE - Mini Mental State Exam 08/19/2017  Orientation to time 4  Orientation to Place 5  Registration 3  Attention/ Calculation 5  Recall 2  Language- name 2 objects 2  Language- repeat 1  Language- follow 3 step command 3  Language- read & follow direction 1  Write a sentence 1  Copy design 1  Total score 28  animal naming 9   CRANIAL NERVES: CN II: Visual fields are full to confrontation.  Pupils are round equal and briskly reactive to light. CN III, IV, VI: extraocular movement are normal. No ptosis. CN V: Facial sensation is intact to pinprick in all 3 divisions bilaterally. Corneal responses are intact.  CN VII: Face is symmetric with normal eye closure and smile. CN VIII: Hearing is normal to rubbing fingers CN IX, X: Palate elevates symmetrically. Phonation is normal. CN XI: Head turning and shoulder shrug are intact CN XII: Tongue is midline with normal movements and no atrophy.  MOTOR: There is no pronator drift of out-stretched arms. Muscle bulk and tone are  normal. Muscle strength is normal.  REFLEXES: Reflexes are 2+ and symmetric at the biceps, triceps, knees, and ankles. Plantar responses are flexor.  SENSORY: Intact to light touch, pinprick, positional sensation and vibratory sensation are intact in fingers and toes.  COORDINATION: Rapid alternating movements and fine finger movements are intact. There is no dysmetria on finger-to-nose and heel-knee-shin.    GAIT/STANCE: Need to push up to get up from seated position, cautious, antalgic, mildly unsteady Romberg is absent.   DIAGNOSTIC DATA (LABS, IMAGING, TESTING) - I reviewed patient records, labs, notes, testing and imaging myself where available.   ASSESSMENT AND PLAN  Ilisa EVOLET SALMINEN is a 82 y.o. female   Visual hallucinations, mild memory loss,   most consistent with central nervous system degenerative disorders,  Does not fit the typical pattern of Alzheimer's disease, she has significant visual hallucinations, I do not see significant parkinsonian features,  Cognitive impairment likely mixed type, with component of depression, deconditioning which is confirmed by neuropsychiatric evaluation in September 2019.  Tried Aricept 10 mg daily in the past, complains of frequent diarrhea,    Gait abnormality  Refer her to physical therapy  Marcial Pacas, M.D. Ph.D.  [facility] [address] [place], [place] [address] Ph: [phone] Fax: [phone]  CC: [name],

## 2018-08-04 ENCOUNTER — Other Ambulatory Visit: Payer: Self-pay | Admitting: Cardiovascular Disease

## 2018-08-04 MED ORDER — FUROSEMIDE 40 MG PO TABS: 40.0000 mg | ORAL_TABLET | Freq: Every day | ORAL | 3 refills | Status: DC

## 2018-08-04 NOTE — Telephone Encounter (Signed)
° ° ° °*  STAT* If patient is at the pharmacy, call can be transferred to refill team.   1. Which medications need to be refilled? (please list name of each medication and dose if known) furosemide (LASIX) 40 MG tablet  2. Which pharmacy/location (including street and city if local pharmacy) is medication to be sent to? [id number] - [place], [place] [address]  3. Do they need a 30 day or 90 day supply? [place]

## 2018-08-04 NOTE — Telephone Encounter (Signed)
Rx has been sent to the pharmacy electronically. ° °

## 2018-08-31 ENCOUNTER — Telehealth: Payer: Self-pay | Admitting: Cardiovascular Disease

## 2018-08-31 ENCOUNTER — Ambulatory Visit: Payer: Medicare Other | Admitting: Cardiovascular Disease

## 2018-08-31 ENCOUNTER — Encounter: Payer: Self-pay | Admitting: Cardiovascular Disease

## 2018-08-31 DIAGNOSIS — E782 Mixed hyperlipidemia: Secondary | ICD-10-CM | POA: Diagnosis not present

## 2018-08-31 DIAGNOSIS — I5042 Chronic combined systolic (congestive) and diastolic (congestive) heart failure: Secondary | ICD-10-CM | POA: Diagnosis not present

## 2018-08-31 DIAGNOSIS — I4821 Permanent atrial fibrillation: Secondary | ICD-10-CM | POA: Diagnosis not present

## 2018-08-31 MED ORDER — FUROSEMIDE 40 MG PO TABS: 40.0000 mg | ORAL_TABLET | Freq: Every day | ORAL | 3 refills | Status: AC

## 2018-08-31 NOTE — Assessment & Plan Note (Signed)
History of essential hypertension her blood pressure measured today 130/76.  She is on amlodipine and hydralazine metoprolol.  Continue current meds at current dosing.

## 2018-08-31 NOTE — Assessment & Plan Note (Signed)
Chronic atrial fibrillation rate controlled on Eliquis oral anticoagulation. This patients CHA2DS2-VASc Score and unadjusted Ischemic Stroke Rate (% per year) is equal to 7.2 % stroke rate/year from a score of 5  Above score calculated as 1 point each if present [CHF, HTN, DM, Vascular=MI/PAD/Aortic Plaque, Age if 65-74, or Female] Above score calculated as 2 points each if present [Age > 75, or Stroke/TIA/TE]

## 2018-08-31 NOTE — Progress Notes (Signed)
08/31/2018 [name]   18-Oct-1935  [id number]  Primary Physician Amy Gravel, MD Primary Cardiologist: Amy Harp MD FACP, [place], [place], [place]  HPI:  Amy Hahn is a 82 y.o.  moderately overweight very Caucasian female mother of 22, grandmother and 3 grandchildren who i last saw in the office  06/22/2017. She was referred by Dr. Maudie Hahn for cardiovascular evaluation because of a recent episode of chest pain. Her risk factors include treated hypertension and hyperlipidemia as well as a strong family history of heart disease with a father who died of a myocardial infarction, a brother who's had stents and a son who's had a heart attack at age 65. She has never had a heart attack or stroke. She had recent nocturnal chest pain 2 days in a row resulting in Amy evaluation. Her blood pressure has been high recently. Since I saw her approximately 6 weeks ago we obtained a Myoview stress test which was low risk although the ejection fraction was 40% by gated SPECT. After adjusting her antihypertensive medications for blood pressure came down nicely and her chest pain has resolved. She has done well since I last saw her in February of this year until recently when she was admitted with slurred speech and dehydration. Turns out that she was taking twice the dose of her oral diuretic. Her blood pressure medicines were adjusted and now she is only on hydralazine.  She was admitted to [facility] 08/11/16 for 2 days in heart failure and A. fib. Her EF was 35%. She was diuresed. She was placed on oral anticoagulation. Since discharge her weight has remained stable. She had another admission 03/12/17 with chest pain. She ruled out for myocardial infarction she did have a heart catheterization performed 11/30/05 by Dr. Doylene Hahn revealing essentially normal coronary arteries with only mild scattered CAD.  Since I saw her in the office approximately 4 months ago she is remained remained stable.  She has had several  Amy admissions for hypertension.  She gets occasional atypical chest pain but otherwise has remained stable.   Current Meds  Medication Sig  . acetaminophen (TYLENOL) 500 MG tablet Take 500 mg by mouth every 6 (six) hours as needed for moderate pain.   [name] amLODipine (NORVASC) 5 MG tablet TAKE 1 TABLET BY MOUTH EVERY DAY  . brimonidine (ALPHAGAN) 0.2 % ophthalmic solution Place 2 drops into the right eye daily.   . cetirizine (ZYRTEC) 10 MG tablet Take 10 mg by mouth daily as needed for allergies.   . Cholecalciferol 5000 units TABS Take 5,000 Units by mouth every evening.  . dorzolamide-timolol (COSOPT) 22.3-6.8 MG/ML ophthalmic solution Place 1 drop into the right eye 2 (two) times daily.   [name] ELIQUIS 5 MG TABS tablet TAKE 1 TABLET BY MOUTH TWICE A DAY  . furosemide (LASIX) 40 MG tablet Take 1 tablet (40 mg total) by mouth daily.  . hydrALAZINE (APRESOLINE) 25 MG tablet TAKE 1 TABLET BY MOUTH THREE TIMES A DAY  . latanoprost (XALATAN) 0.005 % ophthalmic solution Place 1 drop into both eyes at bedtime.  [name] levothyroxine (SYNTHROID, LEVOTHROID) 88 MCG tablet Take 88 mcg by mouth daily before breakfast.  . meclizine (ANTIVERT) 25 MG tablet Take 25 mg by mouth at bedtime.   . Melatonin 3 MG TABS Take 1 tablet by mouth at bedtime.  . metoprolol tartrate (LOPRESSOR) 25 MG tablet Take 0.5 tablets (12.5 mg total) by mouth 2 (two) times daily.  . Multiple Vitamin (MULTIVITAMIN  WITH MINERALS) TABS tablet Take 1 tablet by mouth daily.  . Multiple Vitamins-Minerals (PRESERVISION AREDS 2) CAPS Take 1 capsule by mouth 2 (two) times daily.  . nitroGLYCERIN (NITROSTAT) 0.4 MG SL tablet PLACE 1 TABLET (0.4 MG TOTAL) UNDER THE TONGUE EVERY 5 (FIVE) MINUTES AS NEEDED FOR CHEST PAIN.  [name] omeprazole (PRILOSEC) 20 MG capsule Take 20 mg by mouth at bedtime.   . prednisoLONE acetate (PRED FORTE) 1 % ophthalmic suspension Place 1 drop into the left eye 2 (two) times daily.  . QUEtiapine (SEROQUEL) 50 MG tablet Take 50 mg  by mouth at bedtime.  . rosuvastatin (CRESTOR) 20 MG tablet Take 20 mg by mouth every evening.  . traZODone (DESYREL) 50 MG tablet Take 50 mg by mouth at bedtime.  . [DISCONTINUED] furosemide (LASIX) 40 MG tablet Take 1 tablet (40 mg total) by mouth daily. Please call to make appointment for further refills.     Allergies  Allergen Reactions  . Amitriptyline Other (See Comments)    Weakness   . Edarbi [Azilsartan] Other (See Comments)    Lupus  . Morphine And Related Other (See Comments)    "MAKES ME CRAZY"  . Amlodipine Other (See Comments)    Headache   . Atenolol Other (See Comments)    "decreases blood pressure"  . Codeine Nausea Only  . Doxazosin Other (See Comments)    Leg pain  . Dyazide [Hydrochlorothiazide W-Triamterene] Itching  . Erythromycin Diarrhea and Other (See Comments)    Also cramping  . Labetalol Other (See Comments)    Bradycardia   . Levatol [Penbutolol Sulfate] Swelling    Eye swelling  . Losartan Other (See Comments)    Headache   . Monopril [Fosinopril] Hives  . Contrast Media [Iodinated Diagnostic Agents]   . Ace Inhibitors Cough  . Ampicillin Rash  . Belviq [Lorcaserin Hcl] Itching  . Butrans [Buprenorphine] Rash  . Clotrimazole-Betamethasone Rash  . Elocon [Mometasone Furoate] Rash  . Latex Rash    " IF ON ME FOR OVER 24 HOURS"  . Penicillins Itching and Rash    Has patient had a PCN reaction causing immediate rash, facial/tongue/throat swelling, SOB or lightheadedness with hypotension: Yes Has patient had a PCN reaction causing severe rash involving mucus membranes or skin necrosis: Unknown Has patient had a PCN reaction that required hospitalization: Unknown Has patient had a PCN reaction occurring within the last 10 years: No If all of the above answers are "NO", then may proceed with Cephalosporin use.   [name] Propranolol Itching  . Tape Rash and Other (See Comments)    Does okay with paper tape, but IT CANNOT BE LEFT FOR AN EXTENDED  PERIOD OF TIME. EKG LEADS WILL "PULL OFF HER SKIN"    Social History   Socioeconomic History  . Marital status: Married    Spouse name: Not on file  . Number of children: Not on file  . Years of education: GED  . Highest education level: Not on file  Occupational History  . Occupation: Retired  Scientific laboratory technician  . Financial resource strain: Not on file  . Food insecurity:    Worry: Not on file    Inability: Not on file  . Transportation needs:    Medical: Not on file    Non-medical: Not on file  Tobacco Use  . Smoking status: Former Smoker    Packs/day: 1.00    Types: Cigarettes    Start date: 08/09/1960  . Smokeless tobacco: Never Used  Substance  and Sexual Activity  . Alcohol use: No  . Drug use: No  . Sexual activity: Not on file  Lifestyle  . Physical activity:    Days per week: Not on file    Minutes per session: Not on file  . Stress: Not on file  Relationships  . Social connections:    Talks on phone: Not on file    Gets together: Not on file    Attends religious service: Not on file    Active member of club or organization: Not on file    Attends meetings of clubs or organizations: Not on file    Relationship status: Not on file  . Intimate partner violence:    Fear of current or ex partner: Not on file    Emotionally abused: Not on file    Physically abused: Not on file    Forced sexual activity: Not on file  Other Topics Concern  . Not on file  Social History Narrative   Lives at home with husband.   Right-handed.   No daily use of caffeine.     Review of Systems: General: negative for chills, fever, night sweats or weight changes.  Cardiovascular: negative for chest pain, dyspnea on exertion, edema, orthopnea, palpitations, paroxysmal nocturnal dyspnea or shortness of breath Dermatological: negative for rash Respiratory: negative for cough or wheezing Urologic: negative for hematuria Abdominal: negative for nausea, vomiting, diarrhea, bright red  blood per rectum, melena, or hematemesis Neurologic: negative for visual changes, syncope, or dizziness All other systems reviewed and are otherwise negative except as noted above.    Blood pressure 130/76, pulse (!) 54, height 5\' 2"  (1.575 m), weight 207 lb (93.9 kg).  General appearance: alert and no distress Neck: no adenopathy, no JVD, supple, symmetrical, trachea midline, thyroid not enlarged, symmetric, no tenderness/mass/nodules and Soft bilateral carotid bruits Lungs: clear to auscultation bilaterally Heart: irregularly irregular rhythm Extremities: extremities normal, atraumatic, no cyanosis or edema Pulses: 2+ and symmetric Skin: Skin color, texture, turgor normal. No rashes or lesions Neurologic: Alert and oriented X 3, normal strength and tone. Normal symmetric reflexes. Normal coordination and gait  EKG not performed today  ASSESSMENT AND PLAN:   Essential hypertension History of essential hypertension her blood pressure measured today 130/76.  She is on amlodipine and hydralazine metoprolol.  Continue current meds at current dosing.  Hyperlipidemia History of hyperlipidemia on statin therapy with lipid profile performed 02/02/2018 revealing to cholesterol 120, LDL 48 and HDL 42.  Chronic combined systolic and diastolic CHF (congestive heart failure) ([place]) Ms. Ammon has an EF in the 35% range and appropriate medications and is minimally symptomatic her last 2D echo performed 10/14/2015 revealed ejection fraction of 35 to 62% although diastolic function could not be adequately determined.  She did have a mild MR and AI.  She is also on oral diuretic.  Atrial fibrillation (HCC) Chronic atrial fibrillation rate controlled on Eliquis oral anticoagulation. This patients CHA2DS2-VASc Score and unadjusted Ischemic Stroke Rate (% per year) is equal to 7.2 % stroke rate/year from a score of 5  Above score calculated as 1 point each if present [CHF, HTN, DM, Vascular=MI/PAD/Aortic  Plaque, Age if 65-74, or Female] Above score calculated as 2 points each if present [Age > 75, or Stroke/TIA/TE]       Amy Harp MD [facility], [facility] 08/31/2018 1:48 PM

## 2018-08-31 NOTE — Telephone Encounter (Signed)
New Message    *STAT* If patient is at the pharmacy, call can be transferred to refill team.   1. Which medications need to be refilled? (please list name of each medication and dose if known) furosemide (LASIX) 40 MG tablet  2. Which pharmacy/location (including street and city if local pharmacy) is medication to be sent to? [id number] - [place], [place]  3. Do they need a 30 day or 90 day supply? [place]

## 2018-08-31 NOTE — Patient Instructions (Signed)
Medication Instructions:  none If you need a refill on your cardiac medications before your next appointment, please call your pharmacy.   Lab work: none If you have labs (blood work) drawn today and your tests are completely normal, you will receive your results only by: [name] MyChart Message (if you have MyChart) OR . A paper copy in the mail If you have any lab test that is abnormal or we need to change your treatment, we will call you to review the results.  Testing/Procedures: none  Follow-Up: At [facility], you and your health needs are our priority.  As part of our continuing mission to provide you with exceptional heart care, we have created designated Provider Care Teams.  These Care Teams include your primary Cardiologist (physician) and Advanced Practice Providers (APPs -  Physician Assistants and Nurse Practitioners) who all work together to provide you with the care you need, when you need it. You will need a follow up appointment in 12 months.  Please call our office 2 months in advance to schedule this appointment.  You may see Quay Burow, MD or one of the following Advanced Practice Providers on your designated Care Team:   Kerin Ransom, PA-C Roby [place], [place] . Sande Rives, PA-C

## 2018-08-31 NOTE — Telephone Encounter (Signed)
Prescription sent at office visit today. Receipt confirmed by pharmacy.

## 2018-08-31 NOTE — Assessment & Plan Note (Signed)
History of hyperlipidemia on statin therapy with lipid profile performed 02/02/2018 revealing to cholesterol 120, LDL 48 and HDL 42.

## 2018-08-31 NOTE — Assessment & Plan Note (Signed)
Amy Hahn has an EF in the 35% range and appropriate medications and is minimally symptomatic her last 2D echo performed 10/14/2015 revealed ejection fraction of 35 to 89% although diastolic function could not be adequately determined.  She did have a mild MR and AI.  She is also on oral diuretic.

## 2018-09-14 ENCOUNTER — Other Ambulatory Visit: Payer: Self-pay | Admitting: Cardiovascular Disease

## 2018-10-12 ENCOUNTER — Other Ambulatory Visit: Payer: Self-pay | Admitting: Cardiology

## 2018-10-18 ENCOUNTER — Ambulatory Visit: Payer: Medicare Other | Admitting: Cardiovascular Disease

## 2018-11-05 DEATH — deceased

## 2019-04-23 ENCOUNTER — Other Ambulatory Visit: Payer: Self-pay | Admitting: Thoracic Surgery (Cardiothoracic Vascular Surgery)

## 2019-04-23 DIAGNOSIS — R911 Solitary pulmonary nodule: Secondary | ICD-10-CM

## 2019-05-11 ENCOUNTER — Other Ambulatory Visit: Payer: Medicare Other

## 2019-05-16 ENCOUNTER — Ambulatory Visit: Payer: Medicare Other | Admitting: Thoracic Surgery (Cardiothoracic Vascular Surgery)

## 2019-08-02 ENCOUNTER — Ambulatory Visit: Payer: Medicare Other | Admitting: Neurology
